# Patient Record
Sex: Female | Born: 1950 | Race: White | Hispanic: No | Marital: Married | Smoking: Never smoker
Health system: Southern US, Community
[De-identification: ages and names within clinical notes are randomized; demographics above are authoritative.]

## PROBLEM LIST (undated history)

## (undated) DIAGNOSIS — M810 Age-related osteoporosis without current pathological fracture: Secondary | ICD-10-CM

## (undated) DIAGNOSIS — L570 Actinic keratosis: Secondary | ICD-10-CM

## (undated) DIAGNOSIS — Z923 Personal history of irradiation: Secondary | ICD-10-CM

## (undated) DIAGNOSIS — E039 Hypothyroidism, unspecified: Secondary | ICD-10-CM

## (undated) DIAGNOSIS — C4491 Basal cell carcinoma of skin, unspecified: Secondary | ICD-10-CM

## (undated) DIAGNOSIS — E785 Hyperlipidemia, unspecified: Secondary | ICD-10-CM

## (undated) DIAGNOSIS — C50912 Malignant neoplasm of unspecified site of left female breast: Secondary | ICD-10-CM

## (undated) DIAGNOSIS — Z9221 Personal history of antineoplastic chemotherapy: Secondary | ICD-10-CM

## (undated) DIAGNOSIS — C50919 Malignant neoplasm of unspecified site of unspecified female breast: Secondary | ICD-10-CM

## (undated) DIAGNOSIS — I350 Nonrheumatic aortic (valve) stenosis: Secondary | ICD-10-CM

## (undated) HISTORY — DX: Malignant neoplasm of unspecified site of left female breast: C50.912

## (undated) HISTORY — DX: Actinic keratosis: L57.0

## (undated) HISTORY — PX: RECONSTRUCTIVE REPAIR STERNAL: SUR1094

## (undated) HISTORY — DX: Malignant neoplasm of unspecified site of unspecified female breast: C50.919

## (undated) HISTORY — DX: Basal cell carcinoma of skin, unspecified: C44.91

## (undated) HISTORY — PX: EYE SURGERY: SHX253

---

## 1998-12-25 ENCOUNTER — Other Ambulatory Visit: Admission: RE | Admit: 1998-12-25 | Discharge: 1998-12-25 | Payer: Self-pay | Admitting: *Deleted

## 2000-01-06 ENCOUNTER — Other Ambulatory Visit: Admission: RE | Admit: 2000-01-06 | Discharge: 2000-01-06 | Payer: Self-pay | Admitting: *Deleted

## 2001-03-01 ENCOUNTER — Other Ambulatory Visit: Admission: RE | Admit: 2001-03-01 | Discharge: 2001-03-01 | Payer: Self-pay | Admitting: *Deleted

## 2002-04-19 ENCOUNTER — Other Ambulatory Visit: Admission: RE | Admit: 2002-04-19 | Discharge: 2002-04-19 | Payer: Self-pay | Admitting: *Deleted

## 2003-04-22 ENCOUNTER — Other Ambulatory Visit: Admission: RE | Admit: 2003-04-22 | Discharge: 2003-04-22 | Payer: Self-pay | Admitting: *Deleted

## 2004-11-15 DIAGNOSIS — Z923 Personal history of irradiation: Secondary | ICD-10-CM

## 2004-11-15 DIAGNOSIS — C50919 Malignant neoplasm of unspecified site of unspecified female breast: Secondary | ICD-10-CM

## 2004-11-15 HISTORY — DX: Personal history of irradiation: Z92.3

## 2004-11-15 HISTORY — DX: Malignant neoplasm of unspecified site of unspecified female breast: C50.919

## 2004-11-15 HISTORY — PX: BREAST LUMPECTOMY: SHX2

## 2004-12-23 ENCOUNTER — Encounter: Admission: RE | Admit: 2004-12-23 | Discharge: 2004-12-23 | Payer: Self-pay | Admitting: General Surgery

## 2005-01-07 ENCOUNTER — Encounter: Admission: RE | Admit: 2005-01-07 | Discharge: 2005-01-07 | Payer: Self-pay | Admitting: Surgery

## 2005-01-07 ENCOUNTER — Encounter (INDEPENDENT_AMBULATORY_CARE_PROVIDER_SITE_OTHER): Payer: Self-pay | Admitting: *Deleted

## 2005-01-07 ENCOUNTER — Ambulatory Visit (HOSPITAL_COMMUNITY): Admission: RE | Admit: 2005-01-07 | Discharge: 2005-01-07 | Payer: Self-pay | Admitting: Surgery

## 2005-01-07 ENCOUNTER — Ambulatory Visit (HOSPITAL_BASED_OUTPATIENT_CLINIC_OR_DEPARTMENT_OTHER): Admission: RE | Admit: 2005-01-07 | Discharge: 2005-01-07 | Payer: Self-pay | Admitting: Surgery

## 2005-01-12 ENCOUNTER — Ambulatory Visit: Payer: Self-pay | Admitting: Oncology

## 2005-01-18 ENCOUNTER — Ambulatory Visit: Payer: Self-pay | Admitting: Internal Medicine

## 2005-02-13 ENCOUNTER — Ambulatory Visit: Payer: Self-pay | Admitting: Internal Medicine

## 2005-03-15 ENCOUNTER — Ambulatory Visit: Payer: Self-pay | Admitting: Internal Medicine

## 2005-04-15 ENCOUNTER — Ambulatory Visit: Payer: Self-pay | Admitting: Internal Medicine

## 2005-09-09 ENCOUNTER — Ambulatory Visit: Payer: Self-pay | Admitting: Radiation Oncology

## 2006-03-28 ENCOUNTER — Ambulatory Visit: Payer: Self-pay | Admitting: Internal Medicine

## 2006-07-12 ENCOUNTER — Ambulatory Visit: Payer: Self-pay | Admitting: Gastroenterology

## 2007-03-29 ENCOUNTER — Ambulatory Visit: Payer: Self-pay | Admitting: Radiation Oncology

## 2007-04-16 ENCOUNTER — Ambulatory Visit: Payer: Self-pay | Admitting: Radiation Oncology

## 2007-11-16 ENCOUNTER — Ambulatory Visit: Payer: Self-pay | Admitting: Radiation Oncology

## 2007-11-16 DIAGNOSIS — Z9221 Personal history of antineoplastic chemotherapy: Secondary | ICD-10-CM

## 2007-11-16 DIAGNOSIS — C50912 Malignant neoplasm of unspecified site of left female breast: Secondary | ICD-10-CM

## 2007-11-16 HISTORY — DX: Malignant neoplasm of unspecified site of left female breast: C50.912

## 2007-11-16 HISTORY — DX: Personal history of antineoplastic chemotherapy: Z92.21

## 2007-12-17 ENCOUNTER — Ambulatory Visit: Payer: Self-pay | Admitting: Radiation Oncology

## 2007-12-22 ENCOUNTER — Encounter: Admission: RE | Admit: 2007-12-22 | Discharge: 2007-12-22 | Payer: Self-pay | Admitting: Surgery

## 2008-01-14 ENCOUNTER — Ambulatory Visit: Payer: Self-pay | Admitting: Radiation Oncology

## 2008-02-05 ENCOUNTER — Encounter (INDEPENDENT_AMBULATORY_CARE_PROVIDER_SITE_OTHER): Payer: Self-pay | Admitting: Surgery

## 2008-02-05 ENCOUNTER — Inpatient Hospital Stay (HOSPITAL_COMMUNITY): Admission: RE | Admit: 2008-02-05 | Discharge: 2008-02-06 | Payer: Self-pay | Admitting: Surgery

## 2008-03-15 ENCOUNTER — Ambulatory Visit: Payer: Self-pay | Admitting: Oncology

## 2008-03-25 ENCOUNTER — Encounter (INDEPENDENT_AMBULATORY_CARE_PROVIDER_SITE_OTHER): Payer: Self-pay | Admitting: Specialist

## 2008-03-25 ENCOUNTER — Ambulatory Visit (HOSPITAL_BASED_OUTPATIENT_CLINIC_OR_DEPARTMENT_OTHER): Admission: RE | Admit: 2008-03-25 | Discharge: 2008-03-25 | Payer: Self-pay | Admitting: Specialist

## 2008-04-15 ENCOUNTER — Ambulatory Visit: Payer: Self-pay | Admitting: Oncology

## 2008-05-15 ENCOUNTER — Ambulatory Visit: Payer: Self-pay | Admitting: Oncology

## 2008-06-15 ENCOUNTER — Ambulatory Visit: Payer: Self-pay | Admitting: Oncology

## 2008-07-16 ENCOUNTER — Ambulatory Visit: Payer: Self-pay | Admitting: Oncology

## 2008-08-15 ENCOUNTER — Ambulatory Visit: Payer: Self-pay | Admitting: Oncology

## 2008-09-11 ENCOUNTER — Ambulatory Visit (HOSPITAL_BASED_OUTPATIENT_CLINIC_OR_DEPARTMENT_OTHER): Admission: RE | Admit: 2008-09-11 | Discharge: 2008-09-11 | Payer: Self-pay | Admitting: Specialist

## 2008-09-11 ENCOUNTER — Encounter (INDEPENDENT_AMBULATORY_CARE_PROVIDER_SITE_OTHER): Payer: Self-pay | Admitting: Specialist

## 2008-09-15 ENCOUNTER — Ambulatory Visit: Payer: Self-pay | Admitting: Oncology

## 2008-10-15 ENCOUNTER — Ambulatory Visit: Payer: Self-pay | Admitting: Oncology

## 2008-10-21 ENCOUNTER — Encounter (INDEPENDENT_AMBULATORY_CARE_PROVIDER_SITE_OTHER): Payer: Self-pay | Admitting: Specialist

## 2008-10-21 ENCOUNTER — Ambulatory Visit (HOSPITAL_BASED_OUTPATIENT_CLINIC_OR_DEPARTMENT_OTHER): Admission: RE | Admit: 2008-10-21 | Discharge: 2008-10-21 | Payer: Self-pay | Admitting: Specialist

## 2008-11-15 ENCOUNTER — Ambulatory Visit: Payer: Self-pay | Admitting: Oncology

## 2008-11-15 HISTORY — PX: AUGMENTATION MAMMAPLASTY: SUR837

## 2008-11-15 HISTORY — PX: REDUCTION MAMMAPLASTY: SUR839

## 2008-11-15 HISTORY — PX: MASTECTOMY: SHX3

## 2008-12-16 ENCOUNTER — Ambulatory Visit: Payer: Self-pay | Admitting: Oncology

## 2009-01-13 ENCOUNTER — Ambulatory Visit: Payer: Self-pay | Admitting: Oncology

## 2009-01-27 ENCOUNTER — Encounter (INDEPENDENT_AMBULATORY_CARE_PROVIDER_SITE_OTHER): Payer: Self-pay | Admitting: Specialist

## 2009-01-27 ENCOUNTER — Ambulatory Visit (HOSPITAL_BASED_OUTPATIENT_CLINIC_OR_DEPARTMENT_OTHER): Admission: RE | Admit: 2009-01-27 | Discharge: 2009-01-27 | Payer: Self-pay | Admitting: Specialist

## 2009-02-13 ENCOUNTER — Ambulatory Visit: Payer: Self-pay | Admitting: Oncology

## 2009-03-15 ENCOUNTER — Ambulatory Visit: Payer: Self-pay | Admitting: Oncology

## 2009-05-15 ENCOUNTER — Ambulatory Visit: Payer: Self-pay | Admitting: Oncology

## 2009-06-04 ENCOUNTER — Ambulatory Visit: Payer: Self-pay | Admitting: Oncology

## 2009-06-15 ENCOUNTER — Ambulatory Visit: Payer: Self-pay | Admitting: Oncology

## 2009-09-15 ENCOUNTER — Ambulatory Visit: Payer: Self-pay | Admitting: Oncology

## 2009-10-02 ENCOUNTER — Ambulatory Visit: Payer: Self-pay | Admitting: Oncology

## 2009-10-15 ENCOUNTER — Ambulatory Visit: Payer: Self-pay | Admitting: Oncology

## 2010-01-13 ENCOUNTER — Ambulatory Visit: Payer: Self-pay | Admitting: Oncology

## 2010-01-30 ENCOUNTER — Ambulatory Visit: Payer: Self-pay | Admitting: Oncology

## 2010-02-13 ENCOUNTER — Ambulatory Visit: Payer: Self-pay | Admitting: Oncology

## 2010-08-13 ENCOUNTER — Ambulatory Visit: Payer: Self-pay | Admitting: Oncology

## 2010-08-15 ENCOUNTER — Ambulatory Visit: Payer: Self-pay | Admitting: Oncology

## 2010-09-15 ENCOUNTER — Ambulatory Visit: Payer: Self-pay | Admitting: Oncology

## 2010-12-06 ENCOUNTER — Encounter: Payer: Self-pay | Admitting: Surgery

## 2011-02-15 ENCOUNTER — Ambulatory Visit: Payer: Self-pay | Admitting: Oncology

## 2011-02-25 LAB — POCT HEMOGLOBIN-HEMACUE: Hemoglobin: 13.7 g/dL (ref 12.0–15.0)

## 2011-03-16 ENCOUNTER — Ambulatory Visit: Payer: Self-pay | Admitting: Oncology

## 2011-03-30 NOTE — Op Note (Signed)
NAMEZETHA, KUHAR NO.:  [id number]   MEDICAL RECORD NO.:  [id number]          PATIENT TYPE:  INP   LOCATION:  2899                         FACILITY:  [facility]   PHYSICIAN:  Yaakov Guthrie. Shon Hough, M.D.DATE OF BIRTH:  January 08, 1951   DATE OF PROCEDURE:  02/05/2008  DATE OF DISCHARGE:                               OPERATIVE REPORT   PREOPERATIVE DIAGNOSIS:  Biopsy proven left breast cancer.   POSTOPERATIVE DIAGNOSIS:  Biopsy proven left breast cancer.   OPERATION PERFORMED:  Reconstruction with tissue expansion after patient  underwent mastectomy by Currie Paris, M.D.   SURGEON:  Yaakov Guthrie. Shon Hough, M.D.   ANESTHESIA:   DESCRIPTION OF PROCEDURE:  The patient after having had the mastectomy,  reprep was done to the area with Betadine soap and solution and walled  off with sterile towels and drapes so as to make a sterile field.  A  space was made, a pocket at the junction of the pectoralis major and the  serratus anterior with the Bovie unit on coagulation.  Flap was advanced  laterally as well as pectorally superiorly and medially.  Using the  lighted retractor, I was able to dissect over to the cleavage area, then  inferiorly over the fifth rib, down to that area.  After proper  hemostasis, tissue expansion, Men tor Spectrum type was placed within  the space and was filled up to 400 mL of saline.  The port was placed  laterally into the axillary area and secured with 3-0 Monocryl suture.  After proper hemostasis the flaps were then reclosed.  There was some  thinness of the flap at the inferior portion there that I pointed out to  Dr. Jamey Ripa in the operating room and the wound was closed in layers with  2-0 Monocryl x2 layers, then a running subcuticular stitch with 3-0  Monocryl.  Steri-Strips and sterile dressings were applied.  The wounds  were drained with #10 fully fluted Blake drain which was placed in the  depths of the wound and brought out through  the lateral most portion of  the incision and secured with 3-0 Prolene.  All the wounds were  cleansed.  Half inch Steri-Strips and soft dressings applied to all  areas including Xeroform, 4 x 4s, ABDs and HypaFix tape.  She withstood  the procedures very well, was taken to recovery in excellent condition.      Yaakov Guthrie. Shon Hough, M.D.  Electronically Signed     GLT/MEDQ  D:  02/05/2008  T:  02/05/2008  Job:  [id number]

## 2011-03-30 NOTE — Op Note (Signed)
Kathy Wood, BARDIN NO.:  [id number]   MEDICAL RECORD NO.:  [id number]          PATIENT TYPE:  INP   LOCATION:  2899                         FACILITY:  [facility]   PHYSICIAN:  Currie Paris, M.D.DATE OF BIRTH:  05-09-1951   DATE OF PROCEDURE:  02/05/2008  DATE OF DISCHARGE:                               OPERATIVE REPORT   OFFICE MEDICAL RECORD NUMBER [id number]   PREOPERATIVE DIAGNOSIS:  Carcinoma left breast, upper inner quadrant,  clinical stage I.   POSTOPERATIVE DIAGNOSIS:  Carcinoma left breast, upper inner quadrant,  clinical stage I.   OPERATION:  Left total mastectomy with blue dye injection and axillary  sentinel lymph node biopsy.   CLINICAL HISTORY:  Ms. 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  She has now developed a new breast cancer in another quadrant,  this one in the upper inner quadrant.  This was an invasive ductal.  After discussion of alternatives, she elected proceed to total  mastectomy with sentinel node evaluation.  She saw Dr. Shon Hough for  reconstruction purposes.   DESCRIPTION OF PROCEDURE:  The patient was seen in the holding area and  she had no further questions.  We confirmed mastectomy with  reconstruction as the planned procedure with addition of sentinel lymph  node biopsy.  At the time I saw her, she already had a radioactive  isotope injected.  I initialed the left breast as the operative side,  confirming with the patient.   The patient taken to the operating room.  After satisfactory general  anesthesia had been obtained, the time-out was performed.   I then injected 5 mL dilute methylene blue subareolarly and massaged  that in.  The breast was then prepped and draped as a sterile field.  Using Neoprobe, I identified a hot area and marked the skin overlying  that.  I outlined an elliptical incision, taking a fairly long  inferior  flap as there was residual hematoma from a biopsy and I wanted to be  sure we occluded the as much of the overlying skin as possible in the  upper inner quadrant.  The incision was made.  Skin flaps were raised  first medially to the sternum, superiorly towards the clavicle and then  laterally out into the axilla.  Once I got into that area, I used the  NeoProbe and found a single hot node, non-blue and removed it.  Had  counts of about 900.  With that out, there were no other counts greater  than 10.  I saw no other evidence of a palpable abnormal nodes nor any  blue dye entering the axilla.   The inferior flap was then made going to the inframammary fold and then  laterally to the latissimus.  The breast was removed from medial to  lateral.  Bleeders were either coagulated or tied with a Vicryl suture  as indicated.  The final lateral attachment to latissimus and the chest  wall were divided with a cautery.   I irrigated and made  sure everything was dry.  I again checked the  axilla to make sure I had not left behind any abnormal lymph nodes and  it appeared okay.   At this point, warm moist packs were placed for Dr. Shon Hough to come in  to complete the reconstruction.      Currie Paris, M.D.  Electronically Signed     CJS/MEDQ  D:  02/05/2008  T:  02/05/2008  Job:  [id number]

## 2011-03-30 NOTE — Op Note (Signed)
Kathy Wood, Kathy Wood              ACCOUNT NO.:  [id number]   MEDICAL RECORD NO.:  [id number]          PATIENT TYPE:  AMB   LOCATION:  DSC                          FACILITY:  [facility]   PHYSICIAN:  Earvin Hansen L. [place]es[place], M.D.DATE OF BIRTH:  [birth year]   DATE OF PROCEDURE:  10/21/2008  DATE OF DISCHARGE:                               OPERATIVE REPORT   The patient with a history of left breast cancer, had previous  mastectomy and tissue expansion.  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   PROCEDURES PLANNED:  Right breast reduction using the inferior pedicle  technique.   ANESTHESIA:  General.   Preoperatively, the patient was sat up and drawn for the right inferior  pedicle reduction mammoplasty, re-marked the nipple-areola complex's  distance from the suprasternal notch to 20 cm.  She underwent general  anesthesia and intubated orally.  Prep was done to the chest and breast  areas in a routine fashion using Hibiclens soap and solution, walled off  with sterile towels and drapes so as to make a sterile field.  The areas  were anesthetized with 0.25% Xylocaine with epinephrine, 1:400,000  concentration, total of 150 mL.  The wounds were scored with #15 blade,  and the skin over the inferior pedicle was de-epithelialized with #20  blade.  Medial and lateral fatty dermal pedicles were incised down to  underlying fascia.  Laterally, more accessory breast tissue was removed.  Hemostasis was maintained with the Bovie coagulation.  The new keyhole  area was also debulked, and after the areas received proper hemostasis,  the flaps were rotated and stayed with 3-0 Prolene.  Subcutaneous  closure was done with 3-0 Monocryl x2 layers and running subcuticular  stitch of 3-0 Monocryl and 5-0 Monocryl throughout the inverted T.  The  wound was  drained with a #10 fully-fluted Blake drain, which was placed  in the depths of the wound and brought out of the lateral-most portion  of the incision and secured with 3-0 Prolene.  The wounds were cleansed.  Half inch Steri-Strips and soft dressings were applied including  Xeroform, 4 x 4s, ABDs, Hypafix tape.  She withstood the procedures very  well, was taken to recovery in excellent condition.      Yaakov Guthrie. Shon Hough, M.D.  Electronically Signed     GLT/MEDQ  D:  10/21/2008  T:  10/21/2008  Job:  [id number]

## 2011-03-30 NOTE — Op Note (Signed)
NAMEMIONNA, ADVINCULA              ACCOUNT NO.:  [id number]   MEDICAL RECORD NO.:  [id number]          PATIENT TYPE:  AMB   LOCATION:  DSC                          FACILITY:  [facility]   PHYSICIAN:  Earvin Hansen L. Shon Hough, M.D.DATE OF BIRTH:  15-Mar-1951   DATE OF PROCEDURE:  03/25/2008  DATE OF DISCHARGE:                               OPERATIVE REPORT   INDICATIONS:  A 60 year old lady with recurrent left breast cancer.  The  patient underwent a left mastectomy with tissue expansion several weeks  ago.  Mastectomy was done by Dr. 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  I have  treated this with intensive dressing changes, medications, enzymatic  debriders, etc,  but still we are left with three eschar areas that are  just resistant to healing, and I am afraid that if we continued to wait  after all these weeks, this is going to again be infected, and we are  going to have to remove the tissue expander.  The patient also has been  notified by our oncologist whom I spoke with last week that she will  definitely need chemotherapy and they need to get going within the next  8-12 weeks.  The best decision is to excise all the eschar x3 and then  reconstruction of the defects.   ANESTHESIA:  General.   The patient underwent general anesthesia, intubated orally, prep was  done to the left chest, right chest areas in the routine fashion using  Hibiclens soap and solution, walled off with sterile towels and drapes  to make a sterile field.  Marcaine was used to outline the areas of the  eschar, two in the horizontal crease line of the incision one, however,  vertically in the mid portion of the breast.  They were outlined  elliptically and excised down to underlying deep and subcutaneous tissue  and muscle.  Hemostasis maintained with the Bovie anticoagulation.  Next, the flaps were freed significantly superiorly and inferiorly to  allow [place]  to close the defects without tension and rotate the flap ends  and inset with deep 2-0 sutures of Monocryl, subdermal suture of 3-0  Monocryl, then around the subcuticular stitch of 3-0 Monocryl, all the  areas x3.  Steri-Strips and soft dressings were applied to all the  areas.  She tolerated the procedure very well.   ESTIMATED BLOOD LOSS:  Less than 100 mL.   COMPLICATIONS:  None.      Yaakov Guthrie. Shon Hough, M.D.  Electronically Signed     GLT/MEDQ  D:  03/25/2008  T:  03/26/2008  Job:  [id number]

## 2011-03-30 NOTE — Op Note (Signed)
NAMEMIRRA, Kathy Wood              ACCOUNT NO.:  [id number]   MEDICAL RECORD NO.:  [id number]          PATIENT TYPE:  AMB   LOCATION:  DSC                          FACILITY:  [facility]   PHYSICIAN:  Earvin Hansen L. Shon Hough, M.D.DATE OF BIRTH:  13-Apr-1951   DATE OF PROCEDURE:  DATE OF DISCHARGE:                               OPERATIVE REPORT   A 60 year old lady with history of left breast cancer.  She had previous  mastectomy and tissue expansion done.  Tissue expansion was delayed  because of necrosis of possible skin flap from the mastectomy.  The  areas had subsequently reexcised and that she is ready for tissue  expansion, but now has a port that is twisted.   PROCEDURE:  Removal of tissue expansion.   FINDINGS:  Scar tissue in the left side released, tissue expansion  replacement using a Mentor, reference number [phone], lot number  [id number], and we filled it up today to 400 mL.   Preoperatively, the patient was set up and drawn for the midline of the  chest as well as the inframammary fold in the left side.  She underwent  general anesthesia, intubated orally.  Prep was done to the chest,  breast areas bilaterally using Hibiclens soap and solution, walled off  with sterile towels and drapes so as to make a sterile field.  A 0.5%  Xylocaine with epinephrine was injected locally laterally.  That part of  the incision was open, carried down to the skin and subcutaneous tissue  and underlying pectoralis major muscle.  The muscle was divided in the  direction of its fibers removing the tissue expander.  Next, we traced  up the port, which had been dislodged from the lateral portion of the  axillary region to medially and this was removed and had also flipped on  itself, showing the back end.  After this, the new tissue expander was  placed.  The new port site placed laterally and inferiorly away from  everything, so that easy to inflate her.  We then went ahead and put 400  mL of saline  at this time to get a good head start.  The wounds were  then cleansed.  The muscle repaired with 2-0 Monocryl subcutaneous  tissue and the skin was reapproximated with running subcuticular stitch  of 3-0 Monocryl.  Scar tissue have been removed laterally over the  pocket next to the axillary region with the Bovie unit on coagulation.  The wounds were cleansed.  Steri-Strips and sterile dressing were  applied in all the areas.  She withstood the procedures very well and  was taken to recovery in excellent condition.  Postoperative instruction  to be given.   ESTIMATED BLOOD LOSS:  Less than 100 mL.   COMPLICATIONS:  None.      Yaakov Guthrie. Shon Hough, M.D.  Electronically Signed     GLT/MEDQ  D:  09/11/2008  T:  09/12/2008  Job:  [id number]

## 2011-03-30 NOTE — Op Note (Signed)
NAMEBRI, Kathy Wood              ACCOUNT NO.:  [id number]   MEDICAL RECORD NO.:  [id number]          PATIENT TYPE:  AMB   LOCATION:  DSC                          FACILITY:  [facility]   PHYSICIAN:  Earvin Hansen L. Shon Hough, M.D.DATE OF BIRTH:  May 27, 1951   DATE OF PROCEDURE:  01/27/2009  DATE OF DISCHARGE:                               OPERATIVE REPORT   A 57-year lady who has a history of breast cancer.  I got involved with  her by doing immediate tissue expander reconstruction at the time of her  mastectomy over 2 years ago.  This was complicated by the mastectomy  scar being made a little too thin by the general surgeon causing  ischemic changes of the midportion of the skin and disallowing me to  expand the tissue expander in the timely manner.  She went through a  period where the skin looked better and we waited until it demarcated  itself, then it plateaued over the several weeks' time with the  plateauing.  We advised her to come and have me excised, so that I would  reconstruct the area which has been done and has done well.  Over the  last 2 weeks, although she has developed a pocket with a cystic area  involving the left tissue expander in the chest wall area.  She has  increased scar tissue as preventing the tissue expander to fall in place  in the inframammary fold area.  It seems to be locked up at the outer  quadrant and upper quadrant.   PROCEDURES PLANNED:  Exploration of the left breast tissue expander  area, removal of tissue expander, freeing up of scar tissue, a  capsulotomy, and also capsulectomies, reposition the pocket to allow of  the new tissue expander to fall in place with good symmetry as compared  to the right side, excision of cyst involving the left chest wall and  closure.   ANESTHESIA:  General.   Preoperatively, the patient was stood up and drawn for the midline of  the chest as well as demarcation of the inframammary fold with a right  side being my  template, and I drew a line straight across to measure  with that as well as cleavage areas.  After undergoing general  anesthesia and intubated orally, prep was done to the chest wall and  abdominal areas with Hibiclens soap and solution and walled off with  sterile towels and drapes so as make a sterile field.  A 0.5% Xylocaine  with epinephrine was injected locally for vasoconstriction.  We entered  the lateral portion of the horizontal scar without the skin and  subcutaneous tissue underlying pectoralis major muscle and dividing the  rectum of its fibers to enter into the pocket with scar tissue.  The  implant was removed.  Tremendous scar tissue was involved.  Using the  lighted retractor,  I was able to free up scar tissue and excise a  thickened capsule formation medially and laterally as well as in the  inframammary fold area.  She also had a way-up area across the lower  part of  the incision area which was freed up from inside with  capsulectomy and allowed that tissue to soften and then expand out  without any dimpling.  Hemostasis was maintained with the Bovie  anticoagulation.  The wounds were irrigated with copious amounts of  saline.  After this, the pocket was then replanted with a Mentor tissue  expander, the number was [id number] and the lot number was [id number] and  the reference number [phone].  We filled up to 450 mL with excellent  symmetry for now.  The lateral portion of the compartment part was  plicated with multiple sutures of 2-0 Monocryl.  The muscles were then  reclosed with 2-0 Monocryl in subcutaneous tissue and then the skin  edges were reapproximated with a running subcuticular stitch of 3-0  Monocryl.  The port was placed inferior to the previous location.  I  also observed from inside of the wound the fact that  the mass that was  present on the top part of the skin of the left chest which had a  softness to it and did not penetrate through-and-through, so  it was not  a true herniation.  With this, I was able to excise that tissue and  seemed to be filled with some seromatous fluid.  It was removed  completely and then the wound was closed with 3-0 nylon.  Steri-Strips  and soft dressing were applied to all the areas.  The wound was also  drained with #10 fully fluted Blake drain which was  placed in the depths of wound and brought to the lateral-most portion of  the incision and secured with 3-0 Prolene.  The wounds were cleansed.  The 0.5-inch Steri-Strips and soft dressings were applied to all the  areas.  She tolerated the procedures very well and was taken to the  recovery in excellent condition.      Yaakov Guthrie. Shon Hough, M.D.  Electronically Signed     GLT/MEDQ  D:  01/27/2009  T:  01/28/2009  Job:  [id number]

## 2011-04-02 NOTE — Op Note (Signed)
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   POSTOPERATIVE DIAGNOSIS:  Calcifications left breast with ductal carcinoma  in situ.   OPERATION:  Needle guided partial mastectomy, left side.   SURGEON:  Currie Paris, M.D.   ASSISTANT:  Barth Kirks Mews, PA student.   ANESTHESIA:  General.   CLINICAL HISTORY:  Ms. Hopman is a 60 year old lady who recently had a  little cluster of calcifications noted laterally in the left breast which  were biopsied and shown to have DCIS.  An MRI suggested a larger area of  involvement, but on further evaluation this was felt most likely to  represent hematoma.  However, we decided to bracket the area for excision  and take out a wide area to be sure that no suspicious areas were left  behind.   DESCRIPTION OF PROCEDURE:  The patient was seen in the holding area and had  no further questions.  The patient and I both marked the left breast as the  operative site.  She was taken to the operating room and after satisfactory  general anesthesia had been obtained the left breast was prepped and draped.  A time out occurred to confirm all information was correct.   The 4 guidewires bracketed the area and entered laterally and tracked a  little bit superiorly.  I made an incision between the 2 superior wires and  the 2 inferior wires going in a directly lateral position.  Since I new the  wires retracted superiorly I divided the breast tissue inferior to the 2  inferior wires down to the chest wall then worked around  medially since the  wires did not appear to track medially and then raised a flap superiorly to  go well above I thought the wire tips would be and then take that down to  the chest wall; and then I was able to grasp all of the tissue in my hand,  elevate it out of the wound and divide the lateral attachments with the  cautery.   A few minutes were spent achieving hemostasis.  The specimen was marked with  a margin marker for orientation, and sent for specimen mammography.   The remaining tissue around the breast was primarily fatty and nothing  appeared to be suspicious. Once everything was dried, I closed the subcu  using 3-0 Vicryl and the skin with some 4-0 Monocryl, subcuticular and  Dermabond.   The patient tolerated the procedure well.  There were no complications.  All  counts were correct.  Dr. Isaiah Serge indicated that all the calcifications were  moved and well in the center of the specimen.  CJS/MEDQ  D:  01/07/2005  T:  01/07/2005  Job:  [id number]   cc:   Pershing Cox, M.D.  [address]  [place]  [place] [address]  Fax: [phone]   Alan Mulder, M.D.  [address]  [place]  [place] [address]  Fax: [phone]   Jeralyn Ruths, M.D.

## 2011-04-02 NOTE — Discharge Summary (Signed)
NAMERHEANNA, Kathy Wood NO.:  [id number]   MEDICAL RECORD NO.:  [id number]          PATIENT TYPE:  INP   LOCATION:  5118                         FACILITY:  [facility]   PHYSICIAN:  Currie Paris, M.D.DATE OF BIRTH:  04/05/1951   DATE OF ADMISSION:  02/05/2008  DATE OF DISCHARGE:  02/06/2008                               DISCHARGE SUMMARY   FINAL DIAGNOSIS:  Carcinoma, left breast T1b N0, ER negative, PR  negative, HER-2/Neu positive.   CLINICAL HISTORY:  [name] is a 56-year lady who has previously had a  lumpectomy and radiation therapy for breast cancer.  She has developed a  new lesion in the same breast.  After evaluation, she elected to have a  total mastectomy with sentinel lymph node evaluation and a  reconstruction with tissue expander.   HOSPITAL COURSE:  The patient was admitted on February 05, 2008, and taken  to the operating room where the surgery as noted was performed and she  tolerated that well.  The day following surgery, she was ambulating,  felt fine, her JP drainage was thin, her dressing was dry, and her pain  was well controlled.  She was able to be discharged.   Final pathology report showed negative lymph node and a 0.8-cm grade 2  invasive ductal carcinoma.   PLAN:  Plan is for follow up in my office and by Dr. Shon Hough.      Currie Paris, M.D.  Electronically Signed     CJS/MEDQ  D:  02/28/2008  T:  02/28/2008  Job:  [id number]

## 2011-08-09 LAB — URINALYSIS, ROUTINE W REFLEX MICROSCOPIC
Bilirubin Urine: NEGATIVE
Ketones, ur: NEGATIVE
Nitrite: NEGATIVE
Protein, ur: NEGATIVE
pH: 5.5

## 2011-08-09 LAB — CBC
HCT: 41.8
MCV: 94.4
RBC: 4.42
WBC: 5.6

## 2011-08-09 LAB — DIFFERENTIAL
Basophils Absolute: 0
Basophils Relative: 1
Eosinophils Relative: 2
Monocytes Relative: 5

## 2011-08-09 LAB — COMPREHENSIVE METABOLIC PANEL
AST: 26
Alkaline Phosphatase: 69
BUN: 9
CO2: 33 — ABNORMAL HIGH
Calcium: 10
Creatinine, Ser: 0.66
GFR calc non Af Amer: >60
Potassium: 3.5
Sodium: 141
Total Protein: 7.5

## 2011-08-09 LAB — URINE MICROSCOPIC-ADD ON

## 2011-08-20 LAB — POCT HEMOGLOBIN-HEMACUE: Hemoglobin: 12.6 g/dL (ref 12.0–15.0)

## 2011-08-23 ENCOUNTER — Ambulatory Visit: Payer: Self-pay | Admitting: Oncology

## 2011-09-16 ENCOUNTER — Ambulatory Visit: Payer: Self-pay | Admitting: Oncology

## 2012-02-21 ENCOUNTER — Ambulatory Visit: Payer: Self-pay | Admitting: Oncology

## 2012-02-21 LAB — COMPREHENSIVE METABOLIC PANEL
BUN: 11 mg/dL (ref 7–18)
Bilirubin,Total: 0.6 mg/dL (ref 0.2–1.0)
Calcium, Total: 9.2 mg/dL (ref 8.5–10.1)
Chloride: 106 mmol/L (ref 98–107)
Co2: 28 mmol/L (ref 21–32)
EGFR (Non-African Amer.): >60
Osmolality: 280 (ref 275–301)
Potassium: 4.2 mmol/L (ref 3.5–5.1)
SGOT(AST): 28 U/L (ref 15–37)
Sodium: 141 mmol/L (ref 136–145)
Total Protein: 7.8 g/dL (ref 6.4–8.2)

## 2012-02-21 LAB — CBC CANCER CENTER
Basophil #: 0 x10 3/mm (ref 0.0–0.1)
Basophil %: 0.6 %
MCH: 32.6 pg (ref 26.0–34.0)
MCV: 94 fL (ref 80–100)
Neutrophil #: 2.1 x10 3/mm (ref 1.4–6.5)
Neutrophil %: 53.2 %
Platelet: 239 x10 3/mm (ref 150–440)
RBC: 4.39 10*6/uL (ref 3.80–5.20)

## 2012-03-08 ENCOUNTER — Encounter (INDEPENDENT_AMBULATORY_CARE_PROVIDER_SITE_OTHER): Payer: Self-pay

## 2012-03-15 ENCOUNTER — Ambulatory Visit: Payer: Self-pay | Admitting: Oncology

## 2012-09-05 ENCOUNTER — Ambulatory Visit: Payer: Self-pay | Admitting: Oncology

## 2012-09-05 LAB — COMPREHENSIVE METABOLIC PANEL
BUN: 17 mg/dL (ref 7–18)
Chloride: 104 mmol/L (ref 98–107)
Co2: 29 mmol/L (ref 21–32)
EGFR (Non-African Amer.): >60
Potassium: 3.8 mmol/L (ref 3.5–5.1)
SGOT(AST): 21 U/L (ref 15–37)
SGPT (ALT): 26 U/L (ref 12–78)
Total Protein: 7.3 g/dL (ref 6.4–8.2)

## 2012-09-05 LAB — CBC CANCER CENTER
Basophil %: 1.1 %
Eosinophil #: 0.1 x10 3/mm (ref 0.0–0.7)
Eosinophil %: 1.9 %
HCT: 41.1 % (ref 35.0–47.0)
HGB: 13.4 g/dL (ref 12.0–16.0)
Lymphocyte #: 1.9 x10 3/mm (ref 1.0–3.6)
MCH: 31.3 pg (ref 26.0–34.0)
MCHC: 32.5 g/dL (ref 32.0–36.0)
MCV: 96 fL (ref 80–100)
Monocyte #: 0.3 x10 3/mm (ref 0.2–0.9)
Neutrophil #: 2.7 x10 3/mm (ref 1.4–6.5)
RBC: 4.26 10*6/uL (ref 3.80–5.20)

## 2012-09-15 ENCOUNTER — Ambulatory Visit: Payer: Self-pay | Admitting: Oncology

## 2013-02-13 ENCOUNTER — Ambulatory Visit: Payer: Self-pay | Admitting: Oncology

## 2013-02-28 ENCOUNTER — Encounter (INDEPENDENT_AMBULATORY_CARE_PROVIDER_SITE_OTHER): Payer: Self-pay

## 2013-03-12 LAB — COMPREHENSIVE METABOLIC PANEL
Albumin: 4 g/dL (ref 3.4–5.0)
Anion Gap: 7 (ref 7–16)
BUN: 15 mg/dL (ref 7–18)
Calcium, Total: 9.9 mg/dL (ref 8.5–10.1)
Chloride: 103 mmol/L (ref 98–107)
Co2: 30 mmol/L (ref 21–32)
Creatinine: 0.9 mg/dL (ref 0.60–1.30)
EGFR (African American): >60
Osmolality: 280 (ref 275–301)
Potassium: 5.1 mmol/L (ref 3.5–5.1)
SGOT(AST): 19 U/L (ref 15–37)
SGPT (ALT): 25 U/L (ref 12–78)

## 2013-03-12 LAB — CBC CANCER CENTER
Basophil #: 0.1 x10 3/mm (ref 0.0–0.1)
Eosinophil #: 0.1 x10 3/mm (ref 0.0–0.7)
Lymphocyte #: 1.7 x10 3/mm (ref 1.0–3.6)
Lymphocyte %: 34.3 %
MCH: 31.7 pg (ref 26.0–34.0)
MCV: 93 fL (ref 80–100)
Monocyte #: 0.3 x10 3/mm (ref 0.2–0.9)
Monocyte %: 6.1 %
Neutrophil #: 2.9 x10 3/mm (ref 1.4–6.5)
Neutrophil %: 56.7 %
Platelet: 243 x10 3/mm (ref 150–440)
WBC: 5.1 x10 3/mm (ref 3.6–11.0)

## 2013-03-13 LAB — CANCER ANTIGEN 27.29: CA 27.29: 22.8 U/mL (ref 0.0–38.6)

## 2013-03-15 ENCOUNTER — Ambulatory Visit: Payer: Self-pay | Admitting: Oncology

## 2014-03-27 ENCOUNTER — Ambulatory Visit: Payer: Self-pay | Admitting: Oncology

## 2014-03-27 LAB — CBC CANCER CENTER
Basophil #: 0.1 x10 3/mm (ref 0.0–0.1)
Basophil %: 1.4 %
Eosinophil #: 0.1 x10 3/mm (ref 0.0–0.7)
Eosinophil %: 2.3 %
HCT: 40.3 % (ref 35.0–47.0)
HGB: 13.7 g/dL (ref 12.0–16.0)
Lymphocyte #: 1.5 x10 3/mm (ref 1.0–3.6)
Lymphocyte %: 31.5 %
MCH: 31.8 pg (ref 26.0–34.0)
MCHC: 34 g/dL (ref 32.0–36.0)
MCV: 94 fL (ref 80–100)
Monocyte #: 0.3 x10 3/mm (ref 0.2–0.9)
Monocyte %: 5.2 %
Neutrophil #: 2.9 x10 3/mm (ref 1.4–6.5)
Neutrophil %: 59.6 %
Platelet: 235 x10 3/mm (ref 150–440)
RBC: 4.3 10*6/uL (ref 3.80–5.20)
RDW: 12.6 % (ref 11.5–14.5)
WBC: 4.8 x10 3/mm (ref 3.6–11.0)

## 2014-03-27 LAB — COMPREHENSIVE METABOLIC PANEL
ALBUMIN: 3.8 g/dL (ref 3.4–5.0)
ALK PHOS: 73 U/L
ALT: 24 U/L (ref 12–78)
ANION GAP: 7 (ref 7–16)
AST: 19 U/L (ref 15–37)
BUN: 15 mg/dL (ref 7–18)
Bilirubin,Total: 0.4 mg/dL (ref 0.2–1.0)
CALCIUM: 9.2 mg/dL (ref 8.5–10.1)
CREATININE: 0.75 mg/dL (ref 0.60–1.30)
Chloride: 104 mmol/L (ref 98–107)
Co2: 30 mmol/L (ref 21–32)
EGFR (African American): >60
Glucose: 82 mg/dL (ref 65–99)
OSMOLALITY: 281 (ref 275–301)
POTASSIUM: 4.4 mmol/L (ref 3.5–5.1)
Sodium: 141 mmol/L (ref 136–145)
Total Protein: 7.3 g/dL (ref 6.4–8.2)

## 2014-03-28 LAB — CANCER ANTIGEN 27.29: CA 27.29: 19.5 U/mL (ref 0.0–38.6)

## 2014-03-29 ENCOUNTER — Ambulatory Visit: Payer: Self-pay | Admitting: Oncology

## 2014-04-15 ENCOUNTER — Ambulatory Visit: Payer: Self-pay | Admitting: Oncology

## 2014-11-01 ENCOUNTER — Ambulatory Visit: Payer: Self-pay | Admitting: Specialist

## 2015-03-28 ENCOUNTER — Other Ambulatory Visit: Payer: Self-pay | Admitting: *Deleted

## 2015-03-28 DIAGNOSIS — C50919 Malignant neoplasm of unspecified site of unspecified female breast: Secondary | ICD-10-CM

## 2015-03-31 ENCOUNTER — Inpatient Hospital Stay (HOSPITAL_BASED_OUTPATIENT_CLINIC_OR_DEPARTMENT_OTHER): Payer: Managed Care, Other (non HMO) | Admitting: Oncology

## 2015-03-31 ENCOUNTER — Encounter (INDEPENDENT_AMBULATORY_CARE_PROVIDER_SITE_OTHER): Payer: Self-pay

## 2015-03-31 ENCOUNTER — Inpatient Hospital Stay: Payer: Managed Care, Other (non HMO) | Attending: Oncology

## 2015-03-31 VITALS — BP 130/87 | HR 71 | Temp 95.7°F | Wt 162.5 lb

## 2015-03-31 DIAGNOSIS — Z171 Estrogen receptor negative status [ER-]: Secondary | ICD-10-CM | POA: Diagnosis not present

## 2015-03-31 DIAGNOSIS — Z923 Personal history of irradiation: Secondary | ICD-10-CM

## 2015-03-31 DIAGNOSIS — C50912 Malignant neoplasm of unspecified site of left female breast: Secondary | ICD-10-CM

## 2015-03-31 DIAGNOSIS — Z853 Personal history of malignant neoplasm of breast: Secondary | ICD-10-CM | POA: Insufficient documentation

## 2015-03-31 DIAGNOSIS — C50919 Malignant neoplasm of unspecified site of unspecified female breast: Secondary | ICD-10-CM

## 2015-03-31 LAB — COMPREHENSIVE METABOLIC PANEL
ALBUMIN: 4.4 g/dL (ref 3.5–5.0)
ALT: 17 U/L (ref 14–54)
ANION GAP: 3 — AB (ref 5–15)
AST: 21 U/L (ref 15–41)
Alkaline Phosphatase: 67 U/L (ref 38–126)
BILIRUBIN TOTAL: 0.5 mg/dL (ref 0.3–1.2)
BUN: 17 mg/dL (ref 6–20)
CHLORIDE: 103 mmol/L (ref 101–111)
CO2: 31 mmol/L (ref 22–32)
Calcium: 9.3 mg/dL (ref 8.9–10.3)
Creatinine, Ser: 0.64 mg/dL (ref 0.44–1.00)
GFR calc Af Amer: >60 mL/min (ref >60)
GFR calc non Af Amer: >60 mL/min (ref >60)
Glucose, Bld: 100 mg/dL — ABNORMAL HIGH (ref 65–99)
Potassium: 4.4 mmol/L (ref 3.5–5.1)
Sodium: 137 mmol/L (ref 135–145)
Total Protein: 7.4 g/dL (ref 6.5–8.1)

## 2015-03-31 LAB — CBC WITH DIFFERENTIAL/PLATELET
BASOS ABS: 0 10*3/uL (ref 0–0.1)
Basophils Relative: 1 %
EOS ABS: 0.1 10*3/uL (ref 0–0.7)
EOS PCT: 2 %
HCT: 42.4 % (ref 35.0–47.0)
Hemoglobin: 14.1 g/dL (ref 12.0–16.0)
Lymphocytes Relative: 37 %
Lymphs Abs: 1.6 10*3/uL (ref 1.0–3.6)
MCH: 31.2 pg (ref 26.0–34.0)
MCHC: 33.3 g/dL (ref 32.0–36.0)
MCV: 93.5 fL (ref 80.0–100.0)
MONO ABS: 0.2 10*3/uL (ref 0.2–0.9)
Monocytes Relative: 5 %
Neutro Abs: 2.4 10*3/uL (ref 1.4–6.5)
Neutrophils Relative %: 55 %
PLATELETS: 249 10*3/uL (ref 150–440)
RBC: 4.53 MIL/uL (ref 3.80–5.20)
RDW: 12.2 % (ref 11.5–14.5)
WBC: 4.4 10*3/uL (ref 3.6–11.0)

## 2015-04-01 LAB — CANCER ANTIGEN 27.29: CA 27.29: 15.1 U/mL (ref 0.0–38.6)

## 2015-04-05 ENCOUNTER — Encounter: Payer: Self-pay | Admitting: Oncology

## 2015-04-05 DIAGNOSIS — Z853 Personal history of malignant neoplasm of breast: Secondary | ICD-10-CM | POA: Insufficient documentation

## 2015-04-05 DIAGNOSIS — C50919 Malignant neoplasm of unspecified site of unspecified female breast: Secondary | ICD-10-CM | POA: Insufficient documentation

## 2015-04-05 DIAGNOSIS — C50912 Malignant neoplasm of unspecified site of left female breast: Secondary | ICD-10-CM | POA: Insufficient documentation

## 2015-04-05 DIAGNOSIS — E785 Hyperlipidemia, unspecified: Secondary | ICD-10-CM | POA: Insufficient documentation

## 2015-04-05 NOTE — Progress Notes (Signed)
[place] @ [facility] Telephone:(336) [phone]  [phone]     Kathy Wood OB: 1951/05/09  MR#: [id number]  KGM#:[id number]  Patient Care Team: Tracie Harrier, MD as PCP - General (Internal Medicine)  CHIEF COMPLAINT:  Chief Complaint  Patient presents with  . Follow-up    Oncology History   Subjective: Chief Complaint/Diagnosis:   1.  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 Completed chemotherapy with [facility] in September 2009. 2.  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  Patient had 1 year of maintenance Herceptin therapy     Cancer of left breast   04/05/2015 Initial Diagnosis Cancer of left breast    No flowsheet data found.  INTERVAL HISTORY:64 year old lady came today for yearly checkup.  Since last evaluation we did not find any abnormality.  No chills.  No fever.  No bony pains.  Patient has developed some redness in the left breast area which has been evaluated by plastic surgeon.  Ultrasound and mammogram was done.  She did not reveal any abnormality.  That redness has not changed. REVIEW OF SYSTEMS:   GENERAL:  Feels good.  Active.  No fevers, sweats or weight loss. PERFORMANCE STATUS (ECOG):  0 HEENT:  No visual changes, runny nose, sore throat, mouth sores or tenderness. Lungs: No shortness of breath or cough.  No hemoptysis. Cardiac:  No chest pain, palpitations, orthopnea, or PND. GI:  No nausea, vomiting, diarrhea, constipation, melena or hematochezia. GU:  No urgency, frequency, dysuria, or hematuria. Musculoskeletal:  No back pain.  No joint pain.  No muscle tenderness. Extremities:  No pain or swelling. Skin:  No rashes or skin changes. Neuro:  No headache, numbness or weakness, balance or coordination issues. Endocrine:  No diabetes, thyroid issues, hot flashes or night sweats. Psych:  No mood  changes, depression or anxiety. Pain:  No focal pain. Review of systems:  All other systems reviewed and found to be negative.  As per HPI. Otherwise, a complete review of systems is negatve.  PAST MEDICAL HISTORY: Past Medical History  Diagnosis Date  . Breast cancer   . Cancer of left breast 04/05/2015    PAST SURGICAL HISTORY: Past Surgical History  Procedure Laterality Date  . Mastectomy    . Reconstructive repair sternal Left     FAMILY HISTORY  Past surgical history: As above.    Family history:  No family history of colorectal cancer, breast cancer, or ovarian cancer.    Social history: Patient denies tobacco and alcohol.     ADVANCED DIRECTIVES:   does have advance medical directive  HEALTH MAINTENANCE: History  Substance Use Topics  . Smoking status: Never Smoker   . Smokeless tobacco: Not on file  . Alcohol Use: No      Allergies  Allergen Reactions  . Taxotere [Docetaxel] Other (See Comments) and Anaphylaxis    flush and back pain    Current Outpatient Prescriptions  Medication Sig Dispense Refill  . calcium carbonate (OS-CAL) 600 MG TABS tablet Take 600 mg by mouth daily.    Marland Kitchen levothyroxine (SYNTHROID, LEVOTHROID) 50 MCG tablet Take by mouth.    . Multiple Vitamin (MULTIVITAMIN) tablet Take 1 tablet by mouth daily.    . pravastatin (PRAVACHOL) 40 MG tablet Take 40 mg by mouth daily.     No current facility-administered medications for this visit.    OBJECTIVE:  Filed Vitals:   03/31/15 1106  BP: 130/87  Pulse: 71  Temp: 95.7 F (35.4 C)     Body mass index is 28.33 kg/(m^2).    ECOG FS:0 - Asymptomatic  PHYSICAL EXAM: Goal status: Performance status is good.  Patient has not lost significant weight HEENT: No evidence of stomatitis.   Sclera and conjunctivae :: No jaundice.   pale looking . Lungs: Air  entry equal on both sides.  No rhonchi.  No rales.  Cardiac: Heart sounds are normal.  No pericardial rub.  No murmur. Lymphatic  system: Cervical, axillary, inguinal, lymph nodes not palpable GI: Abdomen is soft.  No ascites.  Liver spleen not palpable.  No tenderness.  Bowel sounds are within normal limit Lower extremity: No edema Neurological system: Higher functions, cranial nerves intact No evidence of peripheral neuropathy. Skin: No rash.  No ecchymosis.. Examination of breast: Right breast free of masses.  Patient had reconstructive surgery done.  Axillary lymph nodes are not palpable.  Lab breast: Redness in a small area of reconstructive breast.  No other palpable masses.  :     STUDIES Lab Results  Component Value Date   LABCA2 15.1 03/31/2015     ASSESSMENT: 64 year old lady with a history of ER negative, PR negative, HER-2 positive stage I carcinoma breast with bilateral reconstructive surgery.  On clinical ground patient does not have any evidence of recurrent disease.  MEDICAL DECISION MAKING:  All lab data has been reviewed.  There is redness in the left breast (reconstructive site) reviewing mammogram as well as ultrasound it may be skin reaction to the capsular calcification.  Patient is being followed by plastic surgeon on a regular basis.  Patient was advised to continue her surgeon and call if redness increases. Continue follow-up in 1 year   Patient expressed understanding and was in agreement with this plan. She also understands that She can call clinic at any time with any questions, concerns, or complaints.    Cancer of left breast   Staging form: Breast, AJCC 7th Edition     Clinical: Stage IA (T1c, N0, M0) - Marni Griffon, MD   04/05/2015 10:57 AM

## 2015-12-22 ENCOUNTER — Other Ambulatory Visit: Payer: Self-pay | Admitting: Internal Medicine

## 2015-12-22 DIAGNOSIS — Z1231 Encounter for screening mammogram for malignant neoplasm of breast: Secondary | ICD-10-CM

## 2015-12-29 ENCOUNTER — Ambulatory Visit
Admission: RE | Admit: 2015-12-29 | Discharge: 2015-12-29 | Disposition: A | Discharge status: Home / Self Care | Payer: BLUE CROSS/BLUE SHIELD | Source: Ambulatory Visit | Attending: Internal Medicine | Admitting: Internal Medicine

## 2015-12-29 DIAGNOSIS — Z1231 Encounter for screening mammogram for malignant neoplasm of breast: Secondary | ICD-10-CM

## 2016-03-30 ENCOUNTER — Other Ambulatory Visit: Payer: Managed Care, Other (non HMO)

## 2016-03-30 ENCOUNTER — Ambulatory Visit: Payer: Managed Care, Other (non HMO) | Admitting: Oncology

## 2016-04-06 ENCOUNTER — Inpatient Hospital Stay: Payer: BLUE CROSS/BLUE SHIELD

## 2016-04-06 ENCOUNTER — Inpatient Hospital Stay: Payer: BLUE CROSS/BLUE SHIELD | Admitting: Oncology

## 2016-04-07 ENCOUNTER — Inpatient Hospital Stay: Payer: BLUE CROSS/BLUE SHIELD | Attending: Oncology

## 2016-04-07 ENCOUNTER — Inpatient Hospital Stay (HOSPITAL_BASED_OUTPATIENT_CLINIC_OR_DEPARTMENT_OTHER): Payer: BLUE CROSS/BLUE SHIELD | Admitting: Family Medicine

## 2016-04-07 VITALS — BP 149/82 | HR 70 | Temp 98.7°F | Resp 16 | Wt 166.6 lb

## 2016-04-07 DIAGNOSIS — Z923 Personal history of irradiation: Secondary | ICD-10-CM

## 2016-04-07 DIAGNOSIS — Z853 Personal history of malignant neoplasm of breast: Secondary | ICD-10-CM

## 2016-04-07 DIAGNOSIS — C50912 Malignant neoplasm of unspecified site of left female breast: Secondary | ICD-10-CM | POA: Diagnosis not present

## 2016-04-07 DIAGNOSIS — Z171 Estrogen receptor negative status [ER-]: Secondary | ICD-10-CM | POA: Diagnosis not present

## 2016-04-07 DIAGNOSIS — C50919 Malignant neoplasm of unspecified site of unspecified female breast: Secondary | ICD-10-CM

## 2016-04-07 DIAGNOSIS — Z9012 Acquired absence of left breast and nipple: Secondary | ICD-10-CM | POA: Insufficient documentation

## 2016-04-07 DIAGNOSIS — Z9221 Personal history of antineoplastic chemotherapy: Secondary | ICD-10-CM

## 2016-04-07 DIAGNOSIS — Z79899 Other long term (current) drug therapy: Secondary | ICD-10-CM | POA: Insufficient documentation

## 2016-04-07 LAB — COMPREHENSIVE METABOLIC PANEL
ALK PHOS: 68 U/L (ref 38–126)
ALT: 19 U/L (ref 14–54)
ANION GAP: 6 (ref 5–15)
AST: 23 U/L (ref 15–41)
Albumin: 4.5 g/dL (ref 3.5–5.0)
BILIRUBIN TOTAL: 0.6 mg/dL (ref 0.3–1.2)
BUN: 18 mg/dL (ref 6–20)
CALCIUM: 9.8 mg/dL (ref 8.9–10.3)
CO2: 30 mmol/L (ref 22–32)
Chloride: 102 mmol/L (ref 101–111)
Creatinine, Ser: 0.75 mg/dL (ref 0.44–1.00)
GFR calc Af Amer: >60 mL/min (ref >60)
GLUCOSE: 106 mg/dL — AB (ref 65–99)
Potassium: 4.2 mmol/L (ref 3.5–5.1)
Sodium: 138 mmol/L (ref 135–145)
TOTAL PROTEIN: 7.3 g/dL (ref 6.5–8.1)

## 2016-04-07 LAB — CBC WITH DIFFERENTIAL/PLATELET
Basophils Absolute: 0.1 10*3/uL (ref 0–0.1)
Basophils Relative: 1 %
Eosinophils Absolute: 0.1 10*3/uL (ref 0–0.7)
Eosinophils Relative: 2 %
HEMATOCRIT: 40.6 % (ref 35.0–47.0)
HEMOGLOBIN: 13.9 g/dL (ref 12.0–16.0)
LYMPHS ABS: 1.9 10*3/uL (ref 1.0–3.6)
Lymphocytes Relative: 31 %
MCH: 31.8 pg (ref 26.0–34.0)
MCHC: 34.1 g/dL (ref 32.0–36.0)
MCV: 93 fL (ref 80.0–100.0)
MONOS PCT: 5 %
Monocytes Absolute: 0.3 10*3/uL (ref 0.2–0.9)
NEUTROS ABS: 3.7 10*3/uL (ref 1.4–6.5)
NEUTROS PCT: 61 %
Platelets: 263 10*3/uL (ref 150–440)
RBC: 4.37 MIL/uL (ref 3.80–5.20)
RDW: 12.7 % (ref 11.5–14.5)
WBC: 6.1 10*3/uL (ref 3.6–11.0)

## 2016-04-07 NOTE — Progress Notes (Signed)
[place]  Telephone:(336) [phone]  [phone]     Kathy Wood DOB: [birth year]  MR#: [id number]  RPR#:[id number]  Patient Care Team: Tracie Harrier, MD as PCP - General (Internal Medicine)  CHIEF COMPLAINT:  Chief Complaint  Patient presents with  . Breast Cancer    follow up    INTERVAL HISTORY:  Patient is here for further follow-up regarding carcinoma of the left breast from 2009 as well as in ipsilateral DCIS from 2006. Patient overall reports feeling very well. She has not had any issues or complaints since her last visit. Patient has undergone a unilateral right screening mammogram in February 2017 that was reported as BI-RADS 1, negative. Patient was also noted to have a bone density exam in February 2017 that revealed osteoporosis. This has been discussed with her primary care provider. Patient expressed desire to no longer follow up in our clinic.  REVIEW OF SYSTEMS:   Review of Systems  Constitutional: Negative for fever, chills, weight loss, malaise/fatigue and diaphoresis.  HENT: Negative.   Eyes: Negative.   Respiratory: Negative for cough, hemoptysis, sputum production, shortness of breath and wheezing.   Cardiovascular: Negative for chest pain, palpitations, orthopnea, claudication, leg swelling and PND.  Gastrointestinal: Negative for heartburn, nausea, vomiting, abdominal pain, diarrhea, constipation, blood in stool and melena.  Genitourinary: Negative.   Musculoskeletal: Negative.   Skin: Negative.   Neurological: Negative for dizziness, tingling, focal weakness, seizures and weakness.  Endo/Heme/Allergies: Does not bruise/bleed easily.  Psychiatric/Behavioral: Negative for depression. The patient is not nervous/anxious and does not have insomnia.     As per HPI. Otherwise, a complete review of systems is negatve.  ONCOLOGY HISTORY: Oncology History   Subjective: Chief Complaint/Diagnosis:   1.  Carcinoma of breast (left)  status post mastectomy and reconstructive surgery T1, N0 M0 stage I. ER/PR negative, HER-2 positive. Completed chemotherapy with [facility] in September 2009. 2.  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  Patient had 1 year of maintenance Herceptin therapy     Cancer of left breast ([place])   04/05/2015 Initial Diagnosis Cancer of left breast    PAST MEDICAL HISTORY: Past Medical History  Diagnosis Date  . Breast cancer ([place])   . Cancer of left breast ([place]) 04/05/2015    PAST SURGICAL HISTORY: Past Surgical History  Procedure Laterality Date  . Mastectomy    . Reconstructive repair sternal Left   . Reduction mammaplasty      FAMILY HISTORY No family history on file.  GYNECOLOGIC HISTORY:  No LMP recorded. Patient is postmenopausal.     ADVANCED DIRECTIVES:    HEALTH MAINTENANCE: Social History  Substance Use Topics  . Smoking status: Never Smoker   . Smokeless tobacco: Not on file  . Alcohol Use: No     Bone density:12/2015  Mammogram:12/2015  Allergies  Allergen Reactions  . Taxotere [Docetaxel] Other (See Comments) and Anaphylaxis    flush and back pain    Current Outpatient Prescriptions  Medication Sig Dispense Refill  . calcium carbonate (OS-CAL) 600 MG TABS tablet Take 600 mg by mouth daily.    Marland Kitchen levothyroxine (SYNTHROID, LEVOTHROID) 75 MCG tablet Take by mouth.    . Multiple Vitamin (MULTIVITAMIN) tablet Take 1 tablet by mouth daily.    . pravastatin (PRAVACHOL) 40 MG tablet Take 40 mg by mouth daily.    Marland Kitchen levothyroxine (SYNTHROID, LEVOTHROID) 50 MCG tablet Take by mouth.     No  current facility-administered medications for this visit.    OBJECTIVE: BP 149/82 mmHg  Pulse 70  Temp(Src) 98.7 F (37.1 C) (Tympanic)  Resp 16  Wt 166 lb 8.9 oz (75.55 kg)   Body mass index is 29.04 kg/(m^2).    ECOG FS:0 - Asymptomatic  General: Well-developed, well-nourished, no acute distress. Eyes: Pink  conjunctiva, anicteric sclera. HEENT: Normocephalic, moist mucous membranes, clear oropharnyx. Lungs: Clear to auscultation bilaterally. Heart: Regular rate and rhythm. No rubs, murmurs, or gallops. Abdomen: Soft, nontender, nondistended. No organomegaly noted, normoactive bowel sounds. Breast: Left breast mastectomy with reconstruction, right breast palpated in a circular manner in the sitting and supine positions.  No masses or fullness palpated.  Axilla palpated in both positions with no masses or fullness palpated.  Musculoskeletal: No edema, cyanosis, or clubbing. Neuro: Alert, answering all questions appropriately. Cranial nerves grossly intact. Skin: No rashes or petechiae noted. Psych: Normal affect. Lymphatics: No cervical, clavicular, or axillary LAD.   LAB RESULTS:  Appointment on 04/07/2016  Component Date Value Ref Range Status  . WBC 04/07/2016 6.1  3.6 - 11.0 K/uL Final  . RBC 04/07/2016 4.37  3.80 - 5.20 MIL/uL Final  . Hemoglobin 04/07/2016 13.9  12.0 - 16.0 g/dL Final  . HCT 04/07/2016 40.6  35.0 - 47.0 % Final  . MCV 04/07/2016 93.0  80.0 - 100.0 fL Final  . MCH 04/07/2016 31.8  26.0 - 34.0 pg Final  . MCHC 04/07/2016 34.1  32.0 - 36.0 g/dL Final  . RDW 04/07/2016 12.7  11.5 - 14.5 % Final  . Platelets 04/07/2016 263  150 - 440 K/uL Final  . Neutrophils Relative % 04/07/2016 61   Final  . Neutro Abs 04/07/2016 3.7  1.4 - 6.5 K/uL Final  . Lymphocytes Relative 04/07/2016 31   Final  . Lymphs Abs 04/07/2016 1.9  1.0 - 3.6 K/uL Final  . Monocytes Relative 04/07/2016 5   Final  . Monocytes Absolute 04/07/2016 0.3  0.2 - 0.9 K/uL Final  . Eosinophils Relative 04/07/2016 2   Final  . Eosinophils Absolute 04/07/2016 0.1  0 - 0.7 K/uL Final  . Basophils Relative 04/07/2016 1   Final  . Basophils Absolute 04/07/2016 0.1  0 - 0.1 K/uL Final  . Sodium 04/07/2016 138  135 - 145 mmol/L Final  . Potassium 04/07/2016 4.2  3.5 - 5.1 mmol/L Final  . Chloride 04/07/2016 102   101 - 111 mmol/L Final  . CO2 04/07/2016 30  22 - 32 mmol/L Final  . Glucose, Bld 04/07/2016 106* 65 - 99 mg/dL Final  . BUN 04/07/2016 18  6 - 20 mg/dL Final  . Creatinine, Ser 04/07/2016 0.75  0.44 - 1.00 mg/dL Final  . Calcium 04/07/2016 9.8  8.9 - 10.3 mg/dL Final  . Total Protein 04/07/2016 7.3  6.5 - 8.1 g/dL Final  . Albumin 04/07/2016 4.5  3.5 - 5.0 g/dL Final  . AST 04/07/2016 23  15 - 41 U/L Final  . ALT 04/07/2016 19  14 - 54 U/L Final  . Alkaline Phosphatase 04/07/2016 68  38 - 126 U/L Final  . Total Bilirubin 04/07/2016 0.6  0.3 - 1.2 mg/dL Final  . GFR calc non Af Amer 04/07/2016 >60  >60 mL/min Final  . GFR calc Af Amer 04/07/2016 >60  >60 mL/min Final   Comment: (NOTE) The eGFR has been calculated using the CKD EPI equation. This calculation has not been validated in all clinical situations. eGFR's persistently <60 mL/min signify possible Chronic Kidney  Disease.   . Anion gap 04/07/2016 6  5 - 15 Final    STUDIES: No results found.  ASSESSMENT:  History of right DCIS in 2006, status post lumpectomy and radiation therapy. History of carcinoma of the left breast in 2009, stage I, T1 N0 M0, ER/PR negative, HER-2 positive.  PLAN:   1. History of carcinoma of the left breast. Status post mastectomy and reconstructive surgery. Patient completed chemotherapy with [facility] as well as 1 year of maintenance Herceptin. She overall reports feeling very well and denies any acute complaints. She is requesting to be released from our care. She reports following closely with her primary care provider Dr. Ginette Pitman.   Discussed with Dr. Oliva Bustard who agrees that patient can be released with close follow-up by her GYN as well as primary care provider. Patient advised to continue with yearly mammograms as well as every two-year DEXA scans. Also advised to continue with monthly self breast exams.  Patient expressed understanding and was in agreement with this plan. She also understands that She  can call clinic at any time with any questions, concerns, or complaints.   Dr. Oliva Bustard was available for consultation and review of plan of care for this patient.  Cancer of left breast [facility])   Staging form: Breast, AJCC 7th Edition     Clinical: Stage IA (T1c, N0, M0) - Unsigned   Evlyn Kanner, NP   04/07/2016 3:46 PM

## 2016-04-08 LAB — CANCER ANTIGEN 27.29: CA 27.29: 21.7 U/mL (ref 0.0–38.6)

## 2016-04-27 DIAGNOSIS — I358 Other nonrheumatic aortic valve disorders: Secondary | ICD-10-CM | POA: Insufficient documentation

## 2016-10-28 ENCOUNTER — Encounter: Payer: Self-pay | Admitting: *Deleted

## 2016-10-29 ENCOUNTER — Encounter: Payer: Self-pay | Admitting: *Deleted

## 2016-10-29 ENCOUNTER — Ambulatory Visit: Payer: BLUE CROSS/BLUE SHIELD | Admitting: Anesthesiology

## 2016-10-29 ENCOUNTER — Ambulatory Visit
Admission: RE | Admit: 2016-10-29 | Discharge: 2016-10-29 | Disposition: A | Discharge status: Home / Self Care | Payer: BLUE CROSS/BLUE SHIELD | Source: Ambulatory Visit | Attending: Gastroenterology | Admitting: Gastroenterology

## 2016-10-29 ENCOUNTER — Encounter
Admission: RE | Disposition: A | Discharge status: Home / Self Care | Payer: Self-pay | Source: Ambulatory Visit | Attending: Gastroenterology

## 2016-10-29 DIAGNOSIS — Z79899 Other long term (current) drug therapy: Secondary | ICD-10-CM | POA: Insufficient documentation

## 2016-10-29 DIAGNOSIS — Z853 Personal history of malignant neoplasm of breast: Secondary | ICD-10-CM | POA: Diagnosis not present

## 2016-10-29 DIAGNOSIS — K648 Other hemorrhoids: Secondary | ICD-10-CM | POA: Diagnosis not present

## 2016-10-29 DIAGNOSIS — E039 Hypothyroidism, unspecified: Secondary | ICD-10-CM | POA: Diagnosis not present

## 2016-10-29 DIAGNOSIS — K5730 Diverticulosis of large intestine without perforation or abscess without bleeding: Secondary | ICD-10-CM | POA: Insufficient documentation

## 2016-10-29 DIAGNOSIS — Z1211 Encounter for screening for malignant neoplasm of colon: Secondary | ICD-10-CM | POA: Insufficient documentation

## 2016-10-29 HISTORY — DX: Hypothyroidism, unspecified: E03.9

## 2016-10-29 HISTORY — PX: COLONOSCOPY WITH PROPOFOL: SHX5780

## 2016-10-29 SURGERY — COLONOSCOPY WITH PROPOFOL
Anesthesia: General

## 2016-10-29 MED ORDER — PROPOFOL 500 MG/50ML IV EMUL
INTRAVENOUS | Status: DC | PRN
  Administered 2016-10-29: 150 ug/kg/min via INTRAVENOUS

## 2016-10-29 MED ORDER — SODIUM CHLORIDE 0.9 % IV SOLN: INTRAVENOUS | Status: DC

## 2016-10-29 MED ORDER — PROPOFOL 10 MG/ML IV BOLUS
INTRAVENOUS | Status: DC | PRN
  Administered 2016-10-29: 60 mg via INTRAVENOUS

## 2016-10-29 MED ORDER — MIDAZOLAM HCL 2 MG/2ML IJ SOLN
INTRAMUSCULAR | Status: DC | PRN
  Administered 2016-10-29: 1 mg via INTRAVENOUS

## 2016-10-29 MED ORDER — LIDOCAINE HCL (PF) 1 % IJ SOLN
INTRAMUSCULAR | Status: AC
  Administered 2016-10-29: 0.3 mL
  Filled 2016-10-29: qty 2

## 2016-10-29 MED ORDER — SODIUM CHLORIDE 0.9 % IV SOLN
INTRAVENOUS | Status: DC
  Administered 2016-10-29: 1000 mL via INTRAVENOUS

## 2016-10-29 NOTE — Op Note (Signed)
[facility] Patient Name: Kathy Wood Procedure Date: 10/29/2016 12:19 PM MRN: [id number] Account #: [id number] Date of Birth: [birth year] Admit Type: Outpatient Age: 65 Room: [facility] ENDO ROOM 3 Gender: Female Note Status: Finalized Procedure:            Colonoscopy Indications:          Screening for colorectal malignant neoplasm Providers:            Lollie Sails, MD Referring MD:         Tracie Harrier, MD (Referring MD) Medicines:            Monitored Anesthesia Care Complications:        No immediate complications. Procedure:            Pre-Anesthesia Assessment:                       - ASA Grade Assessment: II - A patient with mild                        systemic disease.                       After obtaining informed consent, the colonoscope was                        passed under direct vision. Throughout the procedure,                        the patient's blood pressure, pulse, and oxygen                        saturations were monitored continuously. The                        Colonoscope was introduced through the anus and                        advanced to the the cecum, identified by appendiceal                        orifice and ileocecal valve. The colonoscopy was                        performed without difficulty. The patient tolerated the                        procedure well. The quality of the bowel preparation                        was good. Findings:      Multiple small to medium diverticula were found in the sigmoid colon,       descending colon, transverse colon and ascending colon.      Non-bleeding internal hemorrhoids were found during retroflexion and       during anoscopy. The hemorrhoids were small and Grade I (internal       hemorrhoids that do not prolapse).      The digital rectal exam was normal.      The exam was otherwise without abnormality. Impression:           - Diverticulosis in the sigmoid  colon, in the  descending colon, in the transverse colon and in the                        ascending colon.                       - Non-bleeding internal hemorrhoids.                       - No specimens collected. Recommendation:       - Discharge patient to home. Procedure Code(s):    --- Professional ---                       [phone], Colonoscopy, flexible; diagnostic, including                        collection of specimen(s) by brushing or washing, when                        performed (separate procedure) Diagnosis Code(s):    --- Professional ---                       Z12.11, Encounter for screening for malignant neoplasm                        of colon                       K64.0, First degree hemorrhoids                       K57.30, Diverticulosis of large intestine without                        perforation or abscess without bleeding CPT copyright 2016 American Medical Association. All rights reserved. The codes documented in this report are preliminary and upon coder review may  be revised to meet current compliance requirements. Lollie Sails, MD 10/29/2016 12:57:13 PM This report has been signed electronically. Number of Addenda: 0 Note Initiated On: 10/29/2016 12:19 PM Scope Withdrawal Time: 0 hours 8 minutes 21 seconds  Total Procedure Duration: 0 hours 19 minutes 48 seconds       [facility]

## 2016-10-29 NOTE — Anesthesia Preprocedure Evaluation (Signed)
Anesthesia Evaluation  Patient identified by MRN, date of birth, ID band Patient awake    Reviewed: Allergy & Precautions, H&P , NPO status , Patient's Chart, lab work & pertinent test results, reviewed documented beta blocker date and time   History of Anesthesia Complications Negative for: history of anesthetic complications  Airway Mallampati: I  TM Distance: >3 FB Neck ROM: full    Dental no notable dental hx. (+) Caps   Pulmonary neg pulmonary ROS,    Pulmonary exam normal breath sounds clear to auscultation       Cardiovascular Exercise Tolerance: Good (-) hypertension(-) angina(-) CAD, (-) Past MI, (-) Cardiac Stents and (-) CABG Normal cardiovascular exam(-) dysrhythmias + Valvular Problems/Murmurs  Rhythm:regular Rate:Normal     Neuro/Psych negative neurological ROS  negative psych ROS   GI/Hepatic negative GI ROS, Neg liver ROS,   Endo/Other  neg diabetesHypothyroidism   Renal/GU negative Renal ROS  negative genitourinary   Musculoskeletal   Abdominal   Peds  Hematology negative hematology ROS (+)   Anesthesia Other Findings Past Medical History: No date: Breast cancer ([place]) 04/05/2015: Cancer of left breast ([facility]) No date: Hypothyroidism   Reproductive/Obstetrics negative OB ROS                             Anesthesia Physical Anesthesia Plan  ASA: II  Anesthesia Plan: General   Post-op Pain Management:    Induction:   Airway Management Planned:   Additional Equipment:   Intra-op Plan:   Post-operative Plan:   Informed Consent: I have reviewed the patients History and Physical, chart, labs and discussed the procedure including the risks, benefits and alternatives for the proposed anesthesia with the patient or authorized representative who has indicated his/her understanding and acceptance.   Dental Advisory Given  Plan Discussed with: Anesthesiologist, CRNA and  Surgeon  Anesthesia Plan Comments:         Anesthesia Quick Evaluation

## 2016-10-29 NOTE — Transfer of Care (Signed)
Immediate Anesthesia Transfer of Care Note  Patient: [place] ZOGG  Procedure(s) Performed: Procedure(s): COLONOSCOPY WITH PROPOFOL (N/A)  Patient Location: PACU  Anesthesia Type:General  Level of Consciousness: sedated  Airway & Oxygen Therapy: Patient Spontanous Breathing and Patient connected to nasal cannula oxygen  Post-op Assessment: Report given to RN and Post -op Vital signs reviewed and stable  Post vital signs: Reviewed and stable  Last Vitals:  Vitals:   10/29/16 1115 10/29/16 1256  BP: (!) 174/80 (!) 96/52  Pulse: 74 75  Resp: 16 14  Temp: (!) 35.6 C (!) 36.1 C    Last Pain:  Vitals:   10/29/16 1115  TempSrc: Tympanic         Complications: No apparent anesthesia complications

## 2016-10-29 NOTE — H&P (Signed)
Outpatient short stay form Pre-procedure 10/29/2016 12:24 PM Kathy Sails MD  Primary Physician: Dr Tracie Harrier  Reason for visit:  Colonoscopy  History of present illness:  Patient is a 65 year old female presenting today as above. She takes no aspirin or blood thinning agents. Her last colonoscopy was 10 years ago with a finding of diverticulosis otherwise normal. She is returning for repeat screening. sHe tolerated her prep well.    Current Facility-Administered Medications:  .  0.9 %  sodium chloride infusion, , Intravenous, Continuous, Kathy Sails, MD, Last Rate: 20 mL/hr at 10/29/16 1144, 1,000 mL at 10/29/16 1144 .  0.9 %  sodium chloride infusion, , Intravenous, Continuous, Kathy Sails, MD  Prescriptions Prior to Admission  Medication Sig Dispense Refill Last Dose  . levothyroxine (SYNTHROID, LEVOTHROID) 75 MCG tablet Take by mouth.   10/29/2016 at 0800  . calcium carbonate (OS-CAL) 600 MG TABS tablet Take 600 mg by mouth daily.   Taking  . levothyroxine (SYNTHROID, LEVOTHROID) 50 MCG tablet Take by mouth.   Taking  . Multiple Vitamin (MULTIVITAMIN) tablet Take 1 tablet by mouth daily.   Taking  . pravastatin (PRAVACHOL) 40 MG tablet Take 40 mg by mouth daily.   Taking     Allergies  Allergen Reactions  . Taxotere [Docetaxel] Other (See Comments) and Anaphylaxis    flush and back pain     Past Medical History:  Diagnosis Date  . Breast cancer ([place])   . Cancer of left breast ([place]) 04/05/2015  . Hypothyroidism     Review of systems:      Physical Exam    Heart and lungs: Regular rate and rhythm without rub or gallop, lungs are bilaterally clear.    HEENT: Normocephalic atraumatic eyes are anicteric    Other:     Pertinant exam for procedure: Soft nontender nondistended bowel sounds positive normoactive.    Planned proceedures: Colonoscopy and indicated procedures. I have discussed the risks benefits and complications of procedures to  include not limited to bleeding, infection, perforation and the risk of sedation and the patient wishes to proceed.    Kathy Sails, MD Gastroenterology 10/29/2016  12:24 PM

## 2016-10-30 ENCOUNTER — Encounter: Payer: Self-pay | Admitting: Gastroenterology

## 2016-10-31 NOTE — Anesthesia Postprocedure Evaluation (Signed)
Anesthesia Post Note  Patient: Kathy Wood  Procedure(s) Performed: Procedure(s) (LRB): COLONOSCOPY WITH PROPOFOL (N/A)  Patient location during evaluation: Endoscopy Anesthesia Type: General Level of consciousness: awake and alert Pain management: pain level controlled Vital Signs Assessment: post-procedure vital signs reviewed and stable Respiratory status: spontaneous breathing, nonlabored ventilation, respiratory function stable and patient connected to nasal cannula oxygen Cardiovascular status: blood pressure returned to baseline and stable Postop Assessment: no signs of nausea or vomiting Anesthetic complications: no    Last Vitals:  Vitals:   10/29/16 1317 10/29/16 1327  BP: 95/78 125/88  Pulse: 80 69  Resp: 16 13  Temp:      Last Pain:  Vitals:   10/30/16 1035  TempSrc:   PainSc: 0-No pain                 Martha Clan

## 2017-01-03 ENCOUNTER — Other Ambulatory Visit: Payer: Self-pay | Admitting: Internal Medicine

## 2017-01-03 DIAGNOSIS — Z1231 Encounter for screening mammogram for malignant neoplasm of breast: Secondary | ICD-10-CM

## 2017-02-22 ENCOUNTER — Ambulatory Visit
Admission: RE | Admit: 2017-02-22 | Discharge: 2017-02-22 | Disposition: A | Discharge status: Home / Self Care | Payer: BLUE CROSS/BLUE SHIELD | Source: Ambulatory Visit | Attending: Internal Medicine | Admitting: Internal Medicine

## 2017-02-22 DIAGNOSIS — Z1231 Encounter for screening mammogram for malignant neoplasm of breast: Secondary | ICD-10-CM | POA: Insufficient documentation

## 2017-02-22 HISTORY — DX: Personal history of antineoplastic chemotherapy: Z92.21

## 2018-07-21 ENCOUNTER — Other Ambulatory Visit: Payer: Self-pay | Admitting: Internal Medicine

## 2018-07-21 DIAGNOSIS — Z1231 Encounter for screening mammogram for malignant neoplasm of breast: Secondary | ICD-10-CM

## 2018-08-09 ENCOUNTER — Ambulatory Visit
Admission: RE | Admit: 2018-08-09 | Discharge: 2018-08-09 | Disposition: A | Discharge status: Home / Self Care | Payer: Medicare HMO | Source: Ambulatory Visit | Attending: Internal Medicine | Admitting: Internal Medicine

## 2018-08-09 DIAGNOSIS — Z1231 Encounter for screening mammogram for malignant neoplasm of breast: Secondary | ICD-10-CM | POA: Insufficient documentation

## 2018-08-09 HISTORY — DX: Personal history of irradiation: Z92.3

## 2018-10-26 DIAGNOSIS — I77810 Thoracic aortic ectasia: Secondary | ICD-10-CM | POA: Insufficient documentation

## 2018-11-29 HISTORY — PX: CARDIAC CATHETERIZATION: SHX172

## 2018-12-13 DIAGNOSIS — Z952 Presence of prosthetic heart valve: Secondary | ICD-10-CM | POA: Insufficient documentation

## 2018-12-13 HISTORY — PX: CARDIAC VALVE REPLACEMENT: SHX585

## 2018-12-14 DIAGNOSIS — I442 Atrioventricular block, complete: Secondary | ICD-10-CM | POA: Insufficient documentation

## 2018-12-15 DIAGNOSIS — Z950 Presence of cardiac pacemaker: Secondary | ICD-10-CM

## 2018-12-15 HISTORY — PX: INSERT / REPLACE / REMOVE PACEMAKER: SUR710

## 2018-12-15 HISTORY — DX: Presence of cardiac pacemaker: Z95.0

## 2018-12-25 DIAGNOSIS — Z950 Presence of cardiac pacemaker: Secondary | ICD-10-CM | POA: Insufficient documentation

## 2019-01-04 ENCOUNTER — Encounter: Payer: Self-pay | Admitting: *Deleted

## 2019-01-04 ENCOUNTER — Encounter: Payer: Medicare HMO | Attending: Internal Medicine | Admitting: *Deleted

## 2019-01-04 VITALS — Ht 64.5 in | Wt 164.4 lb

## 2019-01-04 DIAGNOSIS — Z7982 Long term (current) use of aspirin: Secondary | ICD-10-CM | POA: Insufficient documentation

## 2019-01-04 DIAGNOSIS — Z9221 Personal history of antineoplastic chemotherapy: Secondary | ICD-10-CM | POA: Diagnosis not present

## 2019-01-04 DIAGNOSIS — Z952 Presence of prosthetic heart valve: Secondary | ICD-10-CM | POA: Insufficient documentation

## 2019-01-04 DIAGNOSIS — Z79890 Hormone replacement therapy (postmenopausal): Secondary | ICD-10-CM | POA: Insufficient documentation

## 2019-01-04 DIAGNOSIS — Z853 Personal history of malignant neoplasm of breast: Secondary | ICD-10-CM | POA: Insufficient documentation

## 2019-01-04 DIAGNOSIS — E039 Hypothyroidism, unspecified: Secondary | ICD-10-CM | POA: Diagnosis not present

## 2019-01-04 DIAGNOSIS — Z923 Personal history of irradiation: Secondary | ICD-10-CM | POA: Diagnosis not present

## 2019-01-04 DIAGNOSIS — Z79899 Other long term (current) drug therapy: Secondary | ICD-10-CM | POA: Insufficient documentation

## 2019-01-04 NOTE — Progress Notes (Signed)
Cardiac Individual Treatment Plan  Patient Details  Name: Kathy Wood MRN: [id number] Date of Birth: 1951/05/15 Referring Provider:     Cardiac Rehab from 01/04/2019 in [facility] Cardiac and Pulmonary Rehab  Referring Provider  Hande      Initial Encounter Date:    Cardiac Rehab from 01/04/2019 in [facility] Cardiac and Pulmonary Rehab  Date  01/04/19      Visit Diagnosis: S/P AVR (aortic valve replacement)  Patient's Home Medications on Admission:  Current Outpatient Medications:  .  alendronate (FOSAMAX) 70 MG tablet, Take 70 mg by mouth once a week., Disp: , Rfl:  .  aspirin 81 MG chewable tablet, Chew by mouth., Disp: , Rfl:  .  calcium carbonate (OS-CAL) 600 MG TABS tablet, Take 600 mg by mouth daily., Disp: , Rfl:  .  Cholecalciferol (VITAMIN D3) 25 MCG (1000 UT) CAPS, Take by mouth., Disp: , Rfl:  .  levothyroxine (SYNTHROID, LEVOTHROID) 75 MCG tablet, , Disp: , Rfl:  .  Multiple Vitamin (MULTIVITAMIN) tablet, Take 1 tablet by mouth daily., Disp: , Rfl:  .  pravastatin (PRAVACHOL) 40 MG tablet, Take 40 mg by mouth daily., Disp: , Rfl:   Past Medical History: Past Medical History:  Diagnosis Date  . 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 Cancer of left breast ([place]) 2009  . Hypothyroidism   . Personal history of chemotherapy 2009  . Personal history of radiation therapy 2006    Tobacco Use: Social History   Tobacco Use  Smoking Status Never Smoker  Smokeless Tobacco Never Used    Labs: Recent Review Flowsheet Data    There is no flowsheet data to display.       Exercise Target Goals: Exercise Program Goal: Individual exercise prescription set using results from initial 6 min walk test and THRR while considering  patient's activity barriers and safety.   Exercise Prescription Goal: Initial exercise prescription builds to 30-45 minutes a day of aerobic activity, 2-3 days per week.  Home exercise  guidelines will be given to patient during program as part of exercise prescription that the participant will acknowledge.  Activity Barriers & Risk Stratification: Activity Barriers & Cardiac Risk Stratification - 01/04/19 1358      Activity Barriers & Cardiac Risk Stratification   Activity Barriers  None    Cardiac Risk Stratification  Low       6 Minute Walk: 6 Minute Walk    Row Name 01/04/19 1423         6 Minute Walk   Phase  Initial     Distance  1400 feet     Walk Time  6 minutes     # of Rest Breaks  0     MPH  2.65     METS  3.6     RPE  11     Perceived Dyspnea   1     VO2 Peak  12.6     Symptoms  No     Resting HR  118 bpm     Resting BP  126/82     Exercise Oxygen Saturation  during 6 min walk  98 %     Max Ex. HR  128 bpm     Max Ex. BP  136/78     2 Minute Post BP  124/76        Oxygen Initial Assessment:   Oxygen Re-Evaluation:  Oxygen Discharge (Final Oxygen Re-Evaluation):   Initial Exercise Prescription: Initial Exercise Prescription - 01/04/19 1400      Date of Initial Exercise RX and Referring Provider   Date  01/04/19    Referring Provider  Hande      Treadmill   MPH  2.6    Grade  1    Minutes  15    METs  3.35      Recumbant Bike   Level  4    RPM  60    Watts  38    Minutes  15    METs  3.6      Elliptical   Level  1    Speed  3    Minutes  15      REL-XR   Level  4    Speed  50    Minutes  15    METs  3.6      Prescription Details   Frequency (times per week)  3    Duration  Progress to 45 minutes of aerobic exercise without signs/symptoms of physical distress      Intensity   THRR 40-80% of Max Heartrate  132-148    Ratings of Perceived Exertion  11-15    Perceived Dyspnea  0-4      Progression   Progression  Continue to progress workloads to maintain intensity without signs/symptoms of physical distress.      Resistance Training   Training Prescription  Yes    Weight  3 lb   no overhead yet    Reps  10-15       Perform Capillary Blood Glucose checks as needed.  Exercise Prescription Changes: Exercise Prescription Changes    Row Name 01/04/19 1400             Response to Exercise   Blood Pressure (Admit)  126/82       Blood Pressure (Exercise)  136/78       Blood Pressure (Exit)  124/76       Heart Rate (Admit)  118 bpm       Heart Rate (Exercise)  128 bpm       Heart Rate (Exit)  123 bpm       Oxygen Saturation (Exit)  98 %       Rating of Perceived Exertion (Exercise)  11          Exercise Comments:   Exercise Goals and Review: Exercise Goals    Row Name 01/04/19 1422             Exercise Goals   Increase Physical Activity  Yes       Intervention  Provide advice, education, support and counseling about physical activity/exercise needs.;Develop an individualized exercise prescription for aerobic and resistive training based on initial evaluation findings, risk stratification, comorbidities and participant's personal goals.       Expected Outcomes  Short Term: Attend rehab on a regular basis to increase amount of physical activity.;Long Term: Add in home exercise to make exercise part of routine and to increase amount of physical activity.;Long Term: Exercising regularly at least 3-5 days a week.       Increase Strength and Stamina  Yes       Intervention  Provide advice, education, support and counseling about physical activity/exercise needs.;Develop an individualized exercise prescription for aerobic and resistive training based on initial evaluation findings, risk stratification, comorbidities and participant's personal goals.       Expected  Outcomes  Short Term: Increase workloads from initial exercise prescription for resistance, speed, and METs.;Short Term: Perform resistance training exercises routinely during rehab and add in resistance training at home;Long Term: Improve cardiorespiratory fitness, muscular endurance and strength as measured by increased  METs and functional capacity (6MWT)       Able to understand and use rate of perceived exertion (RPE) scale  Yes       Intervention  Provide education and explanation on how to use RPE scale       Expected Outcomes  Short Term: Able to use RPE daily in rehab to express subjective intensity level;Long Term:  Able to use RPE to guide intensity level when exercising independently       Knowledge and understanding of Target Heart Rate Range (THRR)  Yes       Intervention  Provide education and explanation of THRR including how the numbers were predicted and where they are located for reference       Expected Outcomes  Short Term: Able to state/look up THRR;Short Term: Able to use daily as guideline for intensity in rehab;Long Term: Able to use THRR to govern intensity when exercising independently       Able to check pulse independently  Yes       Intervention  Provide education and demonstration on how to check pulse in carotid and radial arteries.;Review the importance of being able to check your own pulse for safety during independent exercise       Expected Outcomes  Short Term: Able to explain why pulse checking is important during independent exercise;Long Term: Able to check pulse independently and accurately       Understanding of Exercise Prescription  Yes       Intervention  Provide education, explanation, and written materials on patient's individual exercise prescription       Expected Outcomes  Short Term: Able to explain program exercise prescription;Long Term: Able to explain home exercise prescription to exercise independently          Exercise Goals Re-Evaluation :   Discharge Exercise Prescription (Final Exercise Prescription Changes): Exercise Prescription Changes - 01/04/19 1400      Response to Exercise   Blood Pressure (Admit)  126/82    Blood Pressure (Exercise)  136/78    Blood Pressure (Exit)  124/76    Heart Rate (Admit)  118 bpm    Heart Rate (Exercise)  128 bpm     Heart Rate (Exit)  123 bpm    Oxygen Saturation (Exit)  98 %    Rating of Perceived Exertion (Exercise)  11       Nutrition:  Target Goals: Understanding of nutrition guidelines, daily intake of sodium '1500mg'$ , cholesterol '200mg'$ , calories 30% from fat and 7% or less from saturated fats, daily to have 5 or more servings of fruits and vegetables.  Biometrics: Pre Biometrics - 01/04/19 1422      Pre Biometrics   Height  5' 4.5" (1.638 m)    Weight  164 lb 6.4 oz (74.6 kg)    Waist Circumference  35.5 inches    Hip Circumference  41 inches    Waist to Hip Ratio  0.87 %    BMI (Calculated)  27.79    Single Leg Stand  30 seconds        Nutrition Therapy Plan and Nutrition Goals: Nutrition Therapy & Goals - 01/04/19 1355      Intervention Plan   Intervention  Prescribe, educate and counsel  regarding individualized specific dietary modifications aiming towards targeted core components such as weight, hypertension, lipid management, diabetes, heart failure and other comorbidities.;Nutrition handout(s) given to patient.    Expected Outcomes  Short Term Goal: Understand basic principles of dietary content, such as calories, fat, sodium, cholesterol and nutrients.;Short Term Goal: A plan has been developed with personal nutrition goals set during dietitian appointment.;Long Term Goal: Adherence to prescribed nutrition plan.       Nutrition Assessments: Nutrition Assessments - 01/04/19 1355      MEDFICTS Scores   Pre Score  31       Nutrition Goals Re-Evaluation:   Nutrition Goals Discharge (Final Nutrition Goals Re-Evaluation):   Psychosocial: Target Goals: Acknowledge presence or absence of significant depression and/or stress, maximize coping skills, provide positive support system. Participant is able to verbalize types and ability to use techniques and skills needed for reducing stress and depression.   Initial Review & Psychosocial Screening: Initial Psych Review &  Screening - 01/04/19 1350      Initial Review   Current issues with  Current Stress Concerns    Source of Stress Concerns  Unable to participate in former interests or hobbies    Comments  Debbie's shortness of breath has become an issue after her valve replacement. She has lived a very active lifestyle and is looking to get back to that. She knows it will take time and is very motivated to get going.       Family Dynamics   Good Support System?  Yes   spouse     Barriers   Psychosocial barriers to participate in program  There are no identifiable barriers or psychosocial needs.;The patient should benefit from training in stress management and relaxation.      Screening Interventions   Interventions  Encouraged to exercise;Program counselor consult;Provide feedback about the scores to participant;To provide support and resources with identified psychosocial needs    Expected Outcomes  Short Term goal: Utilizing psychosocial counselor, staff and physician to assist with identification of specific Stressors or current issues interfering with healing process. Setting desired goal for each stressor or current issue identified.;Long Term Goal: Stressors or current issues are controlled or eliminated.;Short Term goal: Identification and review with participant of any Quality of Life or Depression concerns found by scoring the questionnaire.;Long Term goal: The participant improves quality of Life and PHQ9 Scores as seen by post scores and/or verbalization of changes       Quality of Life Scores:  Quality of Life - 01/04/19 1355      Quality of Life   Select  Quality of Life      Quality of Life Scores   Health/Function Pre  25.9 %    Socioeconomic Pre  30 %    Psych/Spiritual Pre  30 %    Family Pre  30 %    GLOBAL Pre  28.14 %      Scores of 19 and below usually indicate a poorer quality of life in these areas.  A difference of  2-3 points is a clinically meaningful difference.  A  difference of 2-3 points in the total score of the Quality of Life Index has been associated with significant improvement in overall quality of life, self-image, physical symptoms, and general health in studies assessing change in quality of life.  PHQ-9: Recent Review Flowsheet Data    Depression screen [facility] 2/9 01/04/2019   Decreased Interest 0   Down, Depressed, Hopeless 0   PHQ -  2 Score 0   Altered sleeping 0   Tired, decreased energy 1   Change in appetite 1   Feeling bad or failure about yourself  0   Trouble concentrating 0   Moving slowly or fidgety/restless 0   PHQ-9 Score 2   Difficult doing work/chores Not difficult at all     Interpretation of Total Score  Total Score Depression Severity:  1-4 = Minimal depression, 5-9 = Mild depression, 10-14 = Moderate depression, 15-19 = Moderately severe depression, 20-27 = Severe depression   Psychosocial Evaluation and Intervention:   Psychosocial Re-Evaluation:   Psychosocial Discharge (Final Psychosocial Re-Evaluation):   Vocational Rehabilitation: Provide vocational rehab assistance to qualifying candidates.   Vocational Rehab Evaluation & Intervention: Vocational Rehab - 01/04/19 1350      Initial Vocational Rehab Evaluation & Intervention   Assessment shows need for Vocational Rehabilitation  No       Education: Education Goals: Education classes will be provided on a variety of topics geared toward better understanding of heart health and risk factor modification. Participant will state understanding/return demonstration of topics presented as noted by education test scores.  Learning Barriers/Preferences: Learning Barriers/Preferences - 01/04/19 1350      Learning Barriers/Preferences   Learning Barriers  None    Learning Preferences  None       Education Topics:  AED/CPR: - Group verbal and written instruction with the use of models to demonstrate the basic use of the AED with the basic ABC's of  resuscitation.   General Nutrition Guidelines/Fats and Fiber: -Group instruction provided by verbal, written material, models and posters to present the general guidelines for heart healthy nutrition. Gives an explanation and review of dietary fats and fiber.   Controlling Sodium/Reading Food Labels: -Group verbal and written material supporting the discussion of sodium use in heart healthy nutrition. Review and explanation with models, verbal and written materials for utilization of the food label.   Exercise Physiology & General Exercise Guidelines: - Group verbal and written instruction with models to review the exercise physiology of the cardiovascular system and associated critical values. Provides general exercise guidelines with specific guidelines to those with heart or lung disease.    Aerobic Exercise & Resistance Training: - Gives group verbal and written instruction on the various components of exercise. Focuses on aerobic and resistive training programs and the benefits of this training and how to safely progress through these programs..   Flexibility, Balance, Mind/Body Relaxation: Provides group verbal/written instruction on the benefits of flexibility and balance training, including mind/body exercise modes such as yoga, pilates and tai chi.  Demonstration and skill practice provided.   Stress and Anxiety: - Provides group verbal and written instruction about the health risks of elevated stress and causes of high stress.  Discuss the correlation between heart/lung disease and anxiety and treatment options. Review healthy ways to manage with stress and anxiety.   Depression: - Provides group verbal and written instruction on the correlation between heart/lung disease and depressed mood, treatment options, and the stigmas associated with seeking treatment.   Anatomy & Physiology of the Heart: - Group verbal and written instruction and models provide basic cardiac anatomy  and physiology, with the coronary electrical and arterial systems. Review of Valvular disease and Heart Failure   Cardiac Procedures: - Group verbal and written instruction to review commonly prescribed medications for heart disease. Reviews the medication, class of the drug, and side effects. Includes the steps to properly store meds and maintain the  prescription regimen. (beta blockers and nitrates)   Cardiac Medications I: - Group verbal and written instruction to review commonly prescribed medications for heart disease. Reviews the medication, class of the drug, and side effects. Includes the steps to properly store meds and maintain the prescription regimen.   Cardiac Medications II: -Group verbal and written instruction to review commonly prescribed medications for heart disease. Reviews the medication, class of the drug, and side effects. (all other drug classes)    Go Sex-Intimacy & Heart Disease, Get SMART - Goal Setting: - Group verbal and written instruction through game format to discuss heart disease and the return to sexual intimacy. Provides group verbal and written material to discuss and apply goal setting through the application of the S.M.A.R.T. Method.   Other Matters of the Heart: - Provides group verbal, written materials and models to describe Stable Angina and Peripheral Artery. Includes description of the disease process and treatment options available to the cardiac patient.   Exercise & Equipment Safety: - Individual verbal instruction and demonstration of equipment use and safety with use of the equipment.   Cardiac Rehab from 01/04/2019 in [facility] Cardiac and Pulmonary Rehab  Date  01/04/19  Educator  [place]  Instruction Review Code  1- Verbalizes Understanding      Infection Prevention: - Provides verbal and written material to individual with discussion of infection control including proper hand washing and proper equipment cleaning during exercise session.    Cardiac Rehab from 01/04/2019 in [facility] Cardiac and Pulmonary Rehab  Date  01/04/19  Educator  [facility]  Instruction Review Code  1- Verbalizes Understanding      Falls Prevention: - Provides verbal and written material to individual with discussion of falls prevention and safety.   Cardiac Rehab from 01/04/2019 in [facility] Cardiac and Pulmonary Rehab  Date  01/04/19  Educator  [facility]  Instruction Review Code  1- Verbalizes Understanding      Diabetes: - Individual verbal and written instruction to review signs/symptoms of diabetes, desired ranges of glucose level fasting, after meals and with exercise. Acknowledge that pre and post exercise glucose checks will be done for 3 sessions at entry of program.   Know Your Numbers and Risk Factors: -Group verbal and written instruction about important numbers in your health.  Discussion of what are risk factors and how they play a role in the disease process.  Review of Cholesterol, Blood Pressure, Diabetes, and BMI and the role they play in your overall health.   Sleep Hygiene: -Provides group verbal and written instruction about how sleep can affect your health.  Define sleep hygiene, discuss sleep cycles and impact of sleep habits. Review good sleep hygiene tips.    Other: -Provides group and verbal instruction on various topics (see comments)   Knowledge Questionnaire Score: Knowledge Questionnaire Score - 01/04/19 1350      Knowledge Questionnaire Score   Pre Score  26/26       Core Components/Risk Factors/Patient Goals at Admission: Personal Goals and Risk Factors at Admission - 01/04/19 1349      Core Components/Risk Factors/Patient Goals on Admission    Weight Management  Yes;Weight Loss    Intervention  Weight Management: Develop a combined nutrition and exercise program designed to reach desired caloric intake, while maintaining appropriate intake of nutrient and fiber, sodium and fats, and appropriate energy expenditure required for  the weight goal.;Weight Management: Provide education and appropriate resources to help participant work on and attain dietary goals.;Weight Management/Obesity: Establish reasonable short  term and long term weight goals.    Admit Weight  164 lb 6.4 oz (74.6 kg)   wants to gain muscle back as well as lose weight   Expected Outcomes  Short Term: Continue to assess and modify interventions until short term weight is achieved;Long Term: Adherence to nutrition and physical activity/exercise program aimed toward attainment of established weight goal;Weight Loss: Understanding of general recommendations for a balanced deficit meal plan, which promotes 1-2 lb weight loss per week and includes a negative energy balance of [id number] kcal/d;Understanding recommendations for meals to include 15-35% energy as protein, 25-35% energy from fat, 35-60% energy from carbohydrates, less than '200mg'$  of dietary cholesterol, 20-35 gm of total fiber daily;Understanding of distribution of calorie intake throughout the day with the consumption of 4-5 meals/snacks    Lipids  Yes    Intervention  Provide education and support for participant on nutrition & aerobic/resistive exercise along with prescribed medications to achieve LDL '70mg'$ , HDL >'40mg'$ .    Expected Outcomes  Short Term: Participant states understanding of desired cholesterol values and is compliant with medications prescribed. Participant is following exercise prescription and nutrition guidelines.;Long Term: Cholesterol controlled with medications as prescribed, with individualized exercise RX and with personalized nutrition plan. Value goals: LDL < '70mg'$ , HDL > 40 mg.       Core Components/Risk Factors/Patient Goals Review:    Core Components/Risk Factors/Patient Goals at Discharge (Final Review):    ITP Comments: ITP Comments    Row Name 01/04/19 1337           ITP Comments  Med Review completed. Initial ITP created. Diagnosis can be found in [facility] 1/28           Comments: Initial ITP

## 2019-01-04 NOTE — Progress Notes (Signed)
Daily Session Note  Patient Details  Name: Kathy Wood MRN: [id number] Date of Birth: [birth year] Referring Provider:     Cardiac Rehab from 01/04/2019 in [facility] Cardiac and Pulmonary Rehab  Referring Provider  Hande      Encounter Date: 01/04/2019  Check In: Session Check In - 01/04/19 1335      Check-In   Supervising physician immediately available to respond to emergencies  See telemetry face sheet for immediately available ER MD    Location  [facility]-Cardiac & Pulmonary Rehab    Staff Present  Renita Papa, RN Vickki Hearing, BA, ACSM CEP, Exercise Physiologist    Warm-up and Cool-down  Not performed (comment)   Med Review   Resistance Training Performed  Yes    VAD Patient?  No    PAD/SET Patient?  No      Pain Assessment   Currently in Pain?  No/denies        Exercise Prescription Changes - 01/04/19 1400      Response to Exercise   Blood Pressure (Admit)  126/82    Blood Pressure (Exercise)  136/78    Blood Pressure (Exit)  124/76    Heart Rate (Admit)  118 bpm    Heart Rate (Exercise)  128 bpm    Heart Rate (Exit)  123 bpm    Oxygen Saturation (Exit)  98 %    Rating of Perceived Exertion (Exercise)  11       Social History   Tobacco Use  Smoking Status Never Smoker  Smokeless Tobacco Never Used    Goals Met:  Proper associated with RPD/PD & O2 Sat Exercise tolerated well Personal goals reviewed No report of cardiac concerns or symptoms Strength training completed today  Goals Unmet:  Not Applicable  Comments: Med review completed   Dr. Emily Filbert is Medical Director for [place] and [facility].

## 2019-01-04 NOTE — Patient Instructions (Signed)
Patient Instructions  Patient Details  Name: Kathy Wood MRN: [id number] Date of Birth: June 11, 1951 Referring Provider:  Tracie Harrier, MD  Below are your personal goals for exercise, nutrition, and risk factors. Our goal is to help you stay on track towards obtaining and maintaining these goals. We will be discussing your progress on these goals with you throughout the program.  Initial Exercise Prescription: Initial Exercise Prescription - 01/04/19 1400      Date of Initial Exercise RX and Referring Provider   Date  01/04/19    Referring Provider  Hande      Treadmill   MPH  2.6    Grade  1    Minutes  15    METs  3.35      Recumbant Bike   Level  4    RPM  60    Watts  38    Minutes  15    METs  3.6      Elliptical   Level  1    Speed  3    Minutes  15      REL-XR   Level  4    Speed  50    Minutes  15    METs  3.6      Prescription Details   Frequency (times per week)  3    Duration  Progress to 45 minutes of aerobic exercise without signs/symptoms of physical distress      Intensity   THRR 40-80% of Max Heartrate  132-148    Ratings of Perceived Exertion  11-15    Perceived Dyspnea  0-4      Progression   Progression  Continue to progress workloads to maintain intensity without signs/symptoms of physical distress.      Resistance Training   Training Prescription  Yes    Weight  3 lb   no overhead yet   Reps  10-15       Exercise Goals: Frequency: Be able to perform aerobic exercise two to three times per week in program working toward 2-5 days per week of home exercise.  Intensity: Work with a perceived exertion of 11 (fairly light) - 15 (hard) while following your exercise prescription.  We will make changes to your prescription with you as you progress through the program.   Duration: Be able to do 30 to 45 minutes of continuous aerobic exercise in addition to a 5 minute warm-up and a 5 minute cool-down routine.   Nutrition Goals: Your  personal nutrition goals will be established when you do your nutrition analysis with the dietician.  The following are general nutrition guidelines to follow: Cholesterol < 200mg /day Sodium < 1500mg /day Fiber: Women over 50 yrs - 21 grams per day  Personal Goals: Personal Goals and Risk Factors at Admission - 01/04/19 1349      Core Components/Risk Factors/Patient Goals on Admission    Weight Management  Yes;Weight Loss    Intervention  Weight Management: Develop a combined nutrition and exercise program designed to reach desired caloric intake, while maintaining appropriate intake of nutrient and fiber, sodium and fats, and appropriate energy expenditure required for the weight goal.;Weight Management: Provide education and appropriate resources to help participant work on and attain dietary goals.;Weight Management/Obesity: Establish reasonable short term and long term weight goals.    Admit Weight  164 lb 6.4 oz (74.6 kg)   wants to gain muscle back as well as lose weight   Expected Outcomes  Short Term: Continue  to assess and modify interventions until short term weight is achieved;Long Term: Adherence to nutrition and physical activity/exercise program aimed toward attainment of established weight goal;Weight Loss: Understanding of general recommendations for a balanced deficit meal plan, which promotes 1-2 lb weight loss per week and includes a negative energy balance of [phone] kcal/d;Understanding recommendations for meals to include 15-35% energy as protein, 25-35% energy from fat, 35-60% energy from carbohydrates, less than 200mg  of dietary cholesterol, 20-35 gm of total fiber daily;Understanding of distribution of calorie intake throughout the day with the consumption of 4-5 meals/snacks    Lipids  Yes    Intervention  Provide education and support for participant on nutrition & aerobic/resistive exercise along with prescribed medications to achieve LDL 70mg , HDL >40mg .    Expected  Outcomes  Short Term: Participant states understanding of desired cholesterol values and is compliant with medications prescribed. Participant is following exercise prescription and nutrition guidelines.;Long Term: Cholesterol controlled with medications as prescribed, with individualized exercise RX and with personalized nutrition plan. Value goals: LDL < 70mg , HDL > 40 mg.       Tobacco Use Initial Evaluation: Social History   Tobacco Use  Smoking Status Never Smoker  Smokeless Tobacco Never Used    Exercise Goals and Review: Exercise Goals    Row Name 01/04/19 1422             Exercise Goals   Increase Physical Activity  Yes       Intervention  Provide advice, education, support and counseling about physical activity/exercise needs.;Develop an individualized exercise prescription for aerobic and resistive training based on initial evaluation findings, risk stratification, comorbidities and participant's personal goals.       Expected Outcomes  Short Term: Attend rehab on a regular basis to increase amount of physical activity.;Long Term: Add in home exercise to make exercise part of routine and to increase amount of physical activity.;Long Term: Exercising regularly at least 3-5 days a week.       Increase Strength and Stamina  Yes       Intervention  Provide advice, education, support and counseling about physical activity/exercise needs.;Develop an individualized exercise prescription for aerobic and resistive training based on initial evaluation findings, risk stratification, comorbidities and participant's personal goals.       Expected Outcomes  Short Term: Increase workloads from initial exercise prescription for resistance, speed, and METs.;Short Term: Perform resistance training exercises routinely during rehab and add in resistance training at home;Long Term: Improve cardiorespiratory fitness, muscular endurance and strength as measured by increased METs and functional capacity  (6MWT)       Able to understand and use rate of perceived exertion (RPE) scale  Yes       Intervention  Provide education and explanation on how to use RPE scale       Expected Outcomes  Short Term: Able to use RPE daily in rehab to express subjective intensity level;Long Term:  Able to use RPE to guide intensity level when exercising independently       Knowledge and understanding of Target Heart Rate Range (THRR)  Yes       Intervention  Provide education and explanation of THRR including how the numbers were predicted and where they are located for reference       Expected Outcomes  Short Term: Able to state/look up THRR;Short Term: Able to use daily as guideline for intensity in rehab;Long Term: Able to use THRR to govern intensity when exercising independently  Able to check pulse independently  Yes       Intervention  Provide education and demonstration on how to check pulse in carotid and radial arteries.;Review the importance of being able to check your own pulse for safety during independent exercise       Expected Outcomes  Short Term: Able to explain why pulse checking is important during independent exercise;Long Term: Able to check pulse independently and accurately       Understanding of Exercise Prescription  Yes       Intervention  Provide education, explanation, and written materials on patient's individual exercise prescription       Expected Outcomes  Short Term: Able to explain program exercise prescription;Long Term: Able to explain home exercise prescription to exercise independently          Copy of goals given to participant.

## 2019-01-08 ENCOUNTER — Encounter: Payer: Medicare HMO | Admitting: *Deleted

## 2019-01-08 DIAGNOSIS — Z952 Presence of prosthetic heart valve: Secondary | ICD-10-CM | POA: Diagnosis not present

## 2019-01-08 NOTE — Progress Notes (Signed)
Daily Session Note  Patient Details  Name: Kathy Wood MRN: [id number] Date of Birth: 1951/08/27 Referring Provider:     Cardiac Rehab from 01/04/2019 in [facility]  Referring Provider  Hande      Encounter Date: 01/08/2019  Check In: Session Check In - 01/08/19 0751      Check-In   Supervising physician immediately available to respond to emergencies  See telemetry face sheet for immediately available ER MD    Location  [facility]-Cardiac & Pulmonary Rehab    Staff Present  Earlean Shawl, BS, ACSM CEP, Exercise Physiologist;Susanne Bice, RN, BSN, CCRP;Jessica Glenfield, MA, RCEP, CCRP, Exercise Physiologist    Medication changes reported      No    Fall or balance concerns reported     No    Tobacco Cessation  No Change    Warm-up and Cool-down  Performed as group-led instruction    Resistance Training Performed  Yes    VAD Patient?  No    PAD/SET Patient?  No      Pain Assessment   Currently in Pain?  No/denies    Multiple Pain Sites  No          Social History   Tobacco Use  Smoking Status Never Smoker  Smokeless Tobacco Never Used    Goals Met:  Exercise tolerated well Personal goals reviewed No report of cardiac concerns or symptoms Strength training completed today  Goals Unmet:  Not Applicable  Comments: First full day of exercise!  Patient was oriented to gym and equipment including functions, settings, policies, and procedures.  Patient's individual exercise prescription and treatment plan were reviewed.  All starting workloads were established based on the results of the 6 minute walk test done at initial orientation visit.  The plan for exercise progression was also introduced and progression will be customized based on patient's performance and goals.    Dr. Emily Filbert is Medical Director for [facility] and [facility].

## 2019-01-10 ENCOUNTER — Encounter: Payer: Self-pay | Admitting: *Deleted

## 2019-01-10 DIAGNOSIS — Z952 Presence of prosthetic heart valve: Secondary | ICD-10-CM | POA: Diagnosis not present

## 2019-01-10 NOTE — Progress Notes (Signed)
Daily Session Note  Patient Details  Name: Kathy Wood MRN: [id number] Date of Birth: 1951/01/20 Referring Provider:     Cardiac Rehab from 01/04/2019 in [facility]  Referring Provider  Hande      Encounter Date: 01/10/2019  Check In: Session Check In - 01/10/19 0740      Check-In   Supervising physician immediately available to respond to emergencies  See telemetry face sheet for immediately available ER MD    Location  [facility]    Staff Present  Justin Mend RCP,RRT,BSRT;Heath Lark, RN, BSN, CCRP;Jessica Luan Pulling, MA, RCEP, CCRP, Exercise Physiologist    Medication changes reported      No    Fall or balance concerns reported     No    Warm-up and Cool-down  Performed as group-led instruction    Resistance Training Performed  Yes    VAD Patient?  No    PAD/SET Patient?  No      Pain Assessment   Currently in Pain?  No/denies          Social History   Tobacco Use  Smoking Status Never Smoker  Smokeless Tobacco Never Used    Goals Met:  Independence with exercise equipment Exercise tolerated well No report of cardiac concerns or symptoms Strength training completed today  Goals Unmet:  Not Applicable  Comments: Pt able to follow exercise prescription today without complaint.  Will continue to monitor for progression.    Dr. Emily Filbert is Medical Director for [facility].

## 2019-01-10 NOTE — Progress Notes (Signed)
Cardiac Individual Treatment Plan  Patient Details  Name: Kathy Wood MRN: [id number] Date of Birth: 1951/05/15 Referring Provider:     Cardiac Rehab from 01/04/2019 in [facility] Cardiac and Pulmonary Rehab  Referring Provider  Hande      Initial Encounter Date:    Cardiac Rehab from 01/04/2019 in [facility] Cardiac and Pulmonary Rehab  Date  01/04/19      Visit Diagnosis: S/P AVR (aortic valve replacement)  Patient's Home Medications on Admission:  Current Outpatient Medications:  .  alendronate (FOSAMAX) 70 MG tablet, Take 70 mg by mouth once a week., Disp: , Rfl:  .  aspirin 81 MG chewable tablet, Chew by mouth., Disp: , Rfl:  .  calcium carbonate (OS-CAL) 600 MG TABS tablet, Take 600 mg by mouth daily., Disp: , Rfl:  .  Cholecalciferol (VITAMIN D3) 25 MCG (1000 UT) CAPS, Take by mouth., Disp: , Rfl:  .  levothyroxine (SYNTHROID, LEVOTHROID) 75 MCG tablet, , Disp: , Rfl:  .  Multiple Vitamin (MULTIVITAMIN) tablet, Take 1 tablet by mouth daily., Disp: , Rfl:  .  pravastatin (PRAVACHOL) 40 MG tablet, Take 40 mg by mouth daily., Disp: , Rfl:   Past Medical History: Past Medical History:  Diagnosis Date  . 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 Cancer of left breast ([place]) 2009  . Hypothyroidism   . Personal history of chemotherapy 2009  . Personal history of radiation therapy 2006    Tobacco Use: Social History   Tobacco Use  Smoking Status Never Smoker  Smokeless Tobacco Never Used    Labs: Recent Review Flowsheet Data    There is no flowsheet data to display.       Exercise Target Goals: Exercise Program Goal: Individual exercise prescription set using results from initial 6 min walk test and THRR while considering  patient's activity barriers and safety.   Exercise Prescription Goal: Initial exercise prescription builds to 30-45 minutes a day of aerobic activity, 2-3 days per week.  Home exercise  guidelines will be given to patient during program as part of exercise prescription that the participant will acknowledge.  Activity Barriers & Risk Stratification: Activity Barriers & Cardiac Risk Stratification - 01/04/19 1358      Activity Barriers & Cardiac Risk Stratification   Activity Barriers  None    Cardiac Risk Stratification  Low       6 Minute Walk: 6 Minute Walk    Row Name 01/04/19 1423         6 Minute Walk   Phase  Initial     Distance  1400 feet     Walk Time  6 minutes     # of Rest Breaks  0     MPH  2.65     METS  3.6     RPE  11     Perceived Dyspnea   1     VO2 Peak  12.6     Symptoms  No     Resting HR  118 bpm     Resting BP  126/82     Exercise Oxygen Saturation  during 6 min walk  98 %     Max Ex. HR  128 bpm     Max Ex. BP  136/78     2 Minute Post BP  124/76        Oxygen Initial Assessment:   Oxygen Re-Evaluation:  Oxygen Discharge (Final Oxygen Re-Evaluation):   Initial Exercise Prescription: Initial Exercise Prescription - 01/04/19 1400      Date of Initial Exercise RX and Referring Provider   Date  01/04/19    Referring Provider  Hande      Treadmill   MPH  2.6    Grade  1    Minutes  15    METs  3.35      Recumbant Bike   Level  4    RPM  60    Watts  38    Minutes  15    METs  3.6      Elliptical   Level  1    Speed  3    Minutes  15      REL-XR   Level  4    Speed  50    Minutes  15    METs  3.6      Prescription Details   Frequency (times per week)  3    Duration  Progress to 45 minutes of aerobic exercise without signs/symptoms of physical distress      Intensity   THRR 40-80% of Max Heartrate  132-148    Ratings of Perceived Exertion  11-15    Perceived Dyspnea  0-4      Progression   Progression  Continue to progress workloads to maintain intensity without signs/symptoms of physical distress.      Resistance Training   Training Prescription  Yes    Weight  3 lb   no overhead yet    Reps  10-15       Perform Capillary Blood Glucose checks as needed.  Exercise Prescription Changes: Exercise Prescription Changes    Row Name 01/04/19 1400             Response to Exercise   Blood Pressure (Admit)  126/82       Blood Pressure (Exercise)  136/78       Blood Pressure (Exit)  124/76       Heart Rate (Admit)  118 bpm       Heart Rate (Exercise)  128 bpm       Heart Rate (Exit)  123 bpm       Oxygen Saturation (Exit)  98 %       Rating of Perceived Exertion (Exercise)  11          Exercise Comments: Exercise Comments    Row Name 01/08/19 [phone]           Exercise Comments   First full day of exercise!  Patient was oriented to gym and equipment including functions, settings, policies, and procedures.  Patient's individual exercise prescription and treatment plan were reviewed.  All starting workloads were established based on the results of the 6 minute walk test done at initial orientation visit.  The plan for exercise progression was also introduced and progression will be customized based on patient's performance and goals.          Exercise Goals and Review: Exercise Goals    Row Name 01/04/19 1422             Exercise Goals   Increase Physical Activity  Yes       Intervention  Provide advice, education, support and counseling about physical activity/exercise needs.;Develop an individualized exercise prescription for aerobic and resistive training based on initial evaluation findings, risk stratification, comorbidities and participant's personal goals.       Expected Outcomes  Short Term: Attend rehab on a regular basis to increase amount of physical activity.;Long Term: Add in home exercise to make exercise part of routine and to increase amount of physical activity.;Long Term: Exercising regularly at least 3-5 days a week.       Increase Strength and Stamina  Yes       Intervention  Provide advice, education, support and counseling about physical  activity/exercise needs.;Develop an individualized exercise prescription for aerobic and resistive training based on initial evaluation findings, risk stratification, comorbidities and participant's personal goals.       Expected Outcomes  Short Term: Increase workloads from initial exercise prescription for resistance, speed, and METs.;Short Term: Perform resistance training exercises routinely during rehab and add in resistance training at home;Long Term: Improve cardiorespiratory fitness, muscular endurance and strength as measured by increased METs and functional capacity (6MWT)       Able to understand and use rate of perceived exertion (RPE) scale  Yes       Intervention  Provide education and explanation on how to use RPE scale       Expected Outcomes  Short Term: Able to use RPE daily in rehab to express subjective intensity level;Long Term:  Able to use RPE to guide intensity level when exercising independently       Knowledge and understanding of Target Heart Rate Range (THRR)  Yes       Intervention  Provide education and explanation of THRR including how the numbers were predicted and where they are located for reference       Expected Outcomes  Short Term: Able to state/look up THRR;Short Term: Able to use daily as guideline for intensity in rehab;Long Term: Able to use THRR to govern intensity when exercising independently       Able to check pulse independently  Yes       Intervention  Provide education and demonstration on how to check pulse in carotid and radial arteries.;Review the importance of being able to check your own pulse for safety during independent exercise       Expected Outcomes  Short Term: Able to explain why pulse checking is important during independent exercise;Long Term: Able to check pulse independently and accurately       Understanding of Exercise Prescription  Yes       Intervention  Provide education, explanation, and written materials on patient's individual  exercise prescription       Expected Outcomes  Short Term: Able to explain program exercise prescription;Long Term: Able to explain home exercise prescription to exercise independently          Exercise Goals Re-Evaluation : Exercise Goals Re-Evaluation    Row Name 01/08/19 0752             Exercise Goal Re-Evaluation   Exercise Goals Review  Increase Physical Activity;Increase Strength and Stamina;Able to understand and use rate of perceived exertion (RPE) scale;Knowledge and understanding of Target Heart Rate Range (THRR);Understanding of Exercise Prescription       Comments  Reviewed RPE scale, THR and program prescription with pt today.  Pt voiced understanding and was given a copy of goals to take home.        Expected Outcomes  Short: Use RPE daily to regulate intensity. Long: Follow program prescription in THR.          Discharge Exercise Prescription (Final Exercise Prescription Changes): Exercise Prescription Changes - 01/04/19 1400      Response to Exercise   Blood  Pressure (Admit)  126/82    Blood Pressure (Exercise)  136/78    Blood Pressure (Exit)  124/76    Heart Rate (Admit)  118 bpm    Heart Rate (Exercise)  128 bpm    Heart Rate (Exit)  123 bpm    Oxygen Saturation (Exit)  98 %    Rating of Perceived Exertion (Exercise)  11       Nutrition:  Target Goals: Understanding of nutrition guidelines, daily intake of sodium <1515m, cholesterol <2061m calories 30% from fat and 7% or less from saturated fats, daily to have 5 or more servings of fruits and vegetables.  Biometrics: Pre Biometrics - 01/04/19 1422      Pre Biometrics   Height  5' 4.5" (1.638 m)    Weight  164 lb 6.4 oz (74.6 kg)    Waist Circumference  35.5 inches    Hip Circumference  41 inches    Waist to Hip Ratio  0.87 %    BMI (Calculated)  27.79    Single Leg Stand  30 seconds        Nutrition Therapy Plan and Nutrition Goals: Nutrition Therapy & Goals - 01/04/19 1355       Intervention Plan   Intervention  Prescribe, educate and counsel regarding individualized specific dietary modifications aiming towards targeted core components such as weight, hypertension, lipid management, diabetes, heart failure and other comorbidities.;Nutrition handout(s) given to patient.    Expected Outcomes  Short Term Goal: Understand basic principles of dietary content, such as calories, fat, sodium, cholesterol and nutrients.;Short Term Goal: A plan has been developed with personal nutrition goals set during dietitian appointment.;Long Term Goal: Adherence to prescribed nutrition plan.       Nutrition Assessments: Nutrition Assessments - 01/04/19 1355      MEDFICTS Scores   Pre Score  31       Nutrition Goals Re-Evaluation:   Nutrition Goals Discharge (Final Nutrition Goals Re-Evaluation):   Psychosocial: Target Goals: Acknowledge presence or absence of significant depression and/or stress, maximize coping skills, provide positive support system. Participant is able to verbalize types and ability to use techniques and skills needed for reducing stress and depression.   Initial Review & Psychosocial Screening: Initial Psych Review & Screening - 01/04/19 1350      Initial Review   Current issues with  Current Stress Concerns    Source of Stress Concerns  Unable to participate in former interests or hobbies    Comments  Kathy Wood's shortness of breath has become an issue after her valve replacement. She has lived a very active lifestyle and is looking to get back to that. She knows it will take time and is very motivated to get going.       Family Dynamics   Good Support System?  Yes   spouse     Barriers   Psychosocial barriers to participate in program  There are no identifiable barriers or psychosocial needs.;The patient should benefit from training in stress management and relaxation.      Screening Interventions   Interventions  Encouraged to exercise;Program  counselor consult;Provide feedback about the scores to participant;To provide support and resources with identified psychosocial needs    Expected Outcomes  Short Term goal: Utilizing psychosocial counselor, staff and physician to assist with identification of specific Stressors or current issues interfering with healing process. Setting desired goal for each stressor or current issue identified.;Long Term Goal: Stressors or current issues are controlled or eliminated.;Short Term goal: Identification  and review with participant of any Quality of Life or Depression concerns found by scoring the questionnaire.;Long Term goal: The participant improves quality of Life and PHQ9 Scores as seen by post scores and/or verbalization of changes       Quality of Life Scores:  Quality of Life - 01/04/19 1355      Quality of Life   Select  Quality of Life      Quality of Life Scores   Health/Function Pre  25.9 %    Socioeconomic Pre  30 %    Psych/Spiritual Pre  30 %    Family Pre  30 %    GLOBAL Pre  28.14 %      Scores of 19 and below usually indicate a poorer quality of life in these areas.  A difference of  2-3 points is a clinically meaningful difference.  A difference of 2-3 points in the total score of the Quality of Life Index has been associated with significant improvement in overall quality of life, self-image, physical symptoms, and general health in studies assessing change in quality of life.  PHQ-9: Recent Review Flowsheet Data    Depression screen [facility] 2/9 01/04/2019   Decreased Interest 0   Down, Depressed, Hopeless 0   PHQ - 2 Score 0   Altered sleeping 0   Tired, decreased energy 1   Change in appetite 1   Feeling bad or failure about yourself  0   Trouble concentrating 0   Moving slowly or fidgety/restless 0   PHQ-9 Score 2   Difficult doing work/chores Not difficult at all     Interpretation of Total Score  Total Score Depression Severity:  1-4 = Minimal depression, 5-9 =  Mild depression, 10-14 = Moderate depression, 15-19 = Moderately severe depression, 20-27 = Severe depression   Psychosocial Evaluation and Intervention: Psychosocial Evaluation - 01/08/19 0910      Psychosocial Evaluation & Interventions   Interventions  Encouraged to exercise with the program and follow exercise prescription;Stress management education    Comments  Counselor met with Kathy Wood) today for initial psychosocial evaluation.  She is 69 year old who had a aortic valve replacement and pacemaker inserted (3) weeks ago.  She has a strong support system with a spouse of 23 years; (3) adult children and active involvement in her local church.  Kathy Wood is cancer free currently after having breast cancer twice and a mastectomy of her left breast.  She also struggles with hypothyroidism and takes medication for that.  She reports sleeping well and her appetite has not returned yet since the surgery. She denies a history of depression or anxiety or any current symptoms and is typically in a positive mood most of the time.  Her primary stressors are her health and being the caregiver for her 29 year old mother in law who lives in [place] assisted living.  Kathy Wood has goals to increase her energy and recognize her exercise limits while in this program.  She will be followed by staff.      Expected Outcomes  Short:  Kathy Wood will exercise consistently to re-gain her energy levels and determine her limits in her exercise regimen.  She will also exercise for stress reduction.  Long:  Kathy Wood will develop a routine of exercise for her health and to manage her stress positively.     Continue Psychosocial Services   Follow up required by staff       Psychosocial Re-Evaluation:   Psychosocial  Discharge (Final Psychosocial Re-Evaluation):   Vocational Rehabilitation: Provide vocational rehab assistance to qualifying candidates.   Vocational Rehab Evaluation & Intervention: Vocational Rehab -  01/04/19 1350      Initial Vocational Rehab Evaluation & Intervention   Assessment shows need for Vocational Rehabilitation  No       Education: Education Goals: Education classes will be provided on a variety of topics geared toward better understanding of heart health and risk factor modification. Participant will state understanding/return demonstration of topics presented as noted by education test scores.  Learning Barriers/Preferences: Learning Barriers/Preferences - 01/04/19 1350      Learning Barriers/Preferences   Learning Barriers  None    Learning Preferences  None       Education Topics:  AED/CPR: - Group verbal and written instruction with the use of models to demonstrate the basic use of the AED with the basic ABC's of resuscitation.   Cardiac Rehab from 01/08/2019 in [facility] Cardiac and Pulmonary Rehab  Date  01/08/19  Educator  SB  Instruction Review Code  1- Verbalizes Understanding      General Nutrition Guidelines/Fats and Fiber: -Group instruction provided by verbal, written material, models and posters to present the general guidelines for heart healthy nutrition. Gives an explanation and review of dietary fats and fiber.   Controlling Sodium/Reading Food Labels: -Group verbal and written material supporting the discussion of sodium use in heart healthy nutrition. Review and explanation with models, verbal and written materials for utilization of the food label.   Exercise Physiology & General Exercise Guidelines: - Group verbal and written instruction with models to review the exercise physiology of the cardiovascular system and associated critical values. Provides general exercise guidelines with specific guidelines to those with heart or lung disease.    Aerobic Exercise & Resistance Training: - Gives group verbal and written instruction on the various components of exercise. Focuses on aerobic and resistive training programs and the benefits of this  training and how to safely progress through these programs..   Flexibility, Balance, Mind/Body Relaxation: Provides group verbal/written instruction on the benefits of flexibility and balance training, including mind/body exercise modes such as yoga, pilates and tai chi.  Demonstration and skill practice provided.   Stress and Anxiety: - Provides group verbal and written instruction about the health risks of elevated stress and causes of high stress.  Discuss the correlation between heart/lung disease and anxiety and treatment options. Review healthy ways to manage with stress and anxiety.   Depression: - Provides group verbal and written instruction on the correlation between heart/lung disease and depressed mood, treatment options, and the stigmas associated with seeking treatment.   Anatomy & Physiology of the Heart: - Group verbal and written instruction and models provide basic cardiac anatomy and physiology, with the coronary electrical and arterial systems. Review of Valvular disease and Heart Failure   Cardiac Procedures: - Group verbal and written instruction to review commonly prescribed medications for heart disease. Reviews the medication, class of the drug, and side effects. Includes the steps to properly store meds and maintain the prescription regimen. (beta blockers and nitrates)   Cardiac Medications I: - Group verbal and written instruction to review commonly prescribed medications for heart disease. Reviews the medication, class of the drug, and side effects. Includes the steps to properly store meds and maintain the prescription regimen.   Cardiac Medications II: -Group verbal and written instruction to review commonly prescribed medications for heart disease. Reviews the medication, class of the drug, and  side effects. (all other drug classes)    Go Sex-Intimacy & Heart Disease, Get SMART - Goal Setting: - Group verbal and written instruction through game format  to discuss heart disease and the return to sexual intimacy. Provides group verbal and written material to discuss and apply goal setting through the application of the S.M.A.R.T. Method.   Other Matters of the Heart: - Provides group verbal, written materials and models to describe Stable Angina and Peripheral Artery. Includes description of the disease process and treatment options available to the cardiac patient.   Exercise & Equipment Safety: - Individual verbal instruction and demonstration of equipment use and safety with use of the equipment.   Cardiac Rehab from 01/08/2019 in [facility] Cardiac and Pulmonary Rehab  Date  01/04/19  Educator  [facility]  Instruction Review Code  1- Verbalizes Understanding      Infection Prevention: - Provides verbal and written material to individual with discussion of infection control including proper hand washing and proper equipment cleaning during exercise session.   Cardiac Rehab from 01/08/2019 in [facility] Cardiac and Pulmonary Rehab  Date  01/04/19  Educator  [facility]  Instruction Review Code  1- Verbalizes Understanding      Falls Prevention: - Provides verbal and written material to individual with discussion of falls prevention and safety.   Cardiac Rehab from 01/08/2019 in [facility] Cardiac and Pulmonary Rehab  Date  01/04/19  Educator  [facility]  Instruction Review Code  1- Verbalizes Understanding      Diabetes: - Individual verbal and written instruction to review signs/symptoms of diabetes, desired ranges of glucose level fasting, after meals and with exercise. Acknowledge that pre and post exercise glucose checks will be done for 3 sessions at entry of program.   Know Your Numbers and Risk Factors: -Group verbal and written instruction about important numbers in your health.  Discussion of what are risk factors and how they play a role in the disease process.  Review of Cholesterol, Blood Pressure, Diabetes, and BMI and the role they play in your overall  health.   Sleep Hygiene: -Provides group verbal and written instruction about how sleep can affect your health.  Define sleep hygiene, discuss sleep cycles and impact of sleep habits. Review good sleep hygiene tips.    Other: -Provides group and verbal instruction on various topics (see comments)   Knowledge Questionnaire Score: Knowledge Questionnaire Score - 01/04/19 1350      Knowledge Questionnaire Score   Pre Score  26/26       Core Components/Risk Factors/Patient Goals at Admission: Personal Goals and Risk Factors at Admission - 01/04/19 1349      Core Components/Risk Factors/Patient Goals on Admission    Weight Management  Yes;Weight Loss    Intervention  Weight Management: Develop a combined nutrition and exercise program designed to reach desired caloric intake, while maintaining appropriate intake of nutrient and fiber, sodium and fats, and appropriate energy expenditure required for the weight goal.;Weight Management: Provide education and appropriate resources to help participant work on and attain dietary goals.;Weight Management/Obesity: Establish reasonable short term and long term weight goals.    Admit Weight  164 lb 6.4 oz (74.6 kg)   wants to gain muscle back as well as lose weight   Expected Outcomes  Short Term: Continue to assess and modify interventions until short term weight is achieved;Long Term: Adherence to nutrition and physical activity/exercise program aimed toward attainment of established weight goal;Weight Loss: Understanding of general recommendations for a balanced deficit  meal plan, which promotes 1-2 lb weight loss per week and includes a negative energy balance of [phone] kcal/d;Understanding recommendations for meals to include 15-35% energy as protein, 25-35% energy from fat, 35-60% energy from carbohydrates, less than 244m of dietary cholesterol, 20-35 gm of total fiber daily;Understanding of distribution of calorie intake throughout the day with  the consumption of 4-5 meals/snacks    Lipids  Yes    Intervention  Provide education and support for participant on nutrition & aerobic/resistive exercise along with prescribed medications to achieve LDL <729m HDL >4012m   Expected Outcomes  Short Term: Participant states understanding of desired cholesterol values and is compliant with medications prescribed. Participant is following exercise prescription and nutrition guidelines.;Long Term: Cholesterol controlled with medications as prescribed, with individualized exercise RX and with personalized nutrition plan. Value goals: LDL < 31m7mDL > 40 mg.       Core Components/Risk Factors/Patient Goals Review:    Core Components/Risk Factors/Patient Goals at Discharge (Final Review):    ITP Comments: ITP Comments    Row Name 01/04/19 1337 01/10/19 0550         ITP Comments  Med Review completed. Initial ITP created. Diagnosis can be found in [facility]  30 day review. Continue with ITP unless directed changes by Medical Director chart review.         Comments:

## 2019-01-12 ENCOUNTER — Encounter: Payer: Medicare HMO | Admitting: *Deleted

## 2019-01-12 DIAGNOSIS — Z952 Presence of prosthetic heart valve: Secondary | ICD-10-CM | POA: Diagnosis not present

## 2019-01-12 NOTE — Progress Notes (Signed)
Daily Session Note  Patient Details  Name: Kathy Wood MRN: [id number] Date of Birth: Jul 22, 1951 Referring Provider:     Cardiac Rehab from 01/04/2019 in [facility] Cardiac and Pulmonary Rehab  Referring Provider  Hande      Encounter Date: 01/12/2019  Check In: Session Check In - 01/12/19 0842      Check-In   Supervising physician immediately available to respond to emergencies  See telemetry face sheet for immediately available ER MD    Location  [facility]-Cardiac & Pulmonary Rehab    Staff Present  Renita Papa, RN BSN;Jessica Luan Pulling, MA, RCEP, CCRP, Exercise Physiologist;Krista Frederico Hamman, RN BSN    Medication changes reported      No    Fall or balance concerns reported     No    Tobacco Cessation  No Change    Warm-up and Cool-down  Performed as group-led Higher education careers adviser Performed  Yes    VAD Patient?  No      Pain Assessment   Currently in Pain?  No/denies          Social History   Tobacco Use  Smoking Status Never Smoker  Smokeless Tobacco Never Used    Goals Met:  Independence with exercise equipment Using PLB without cueing & demonstrates good technique Exercise tolerated well Queuing for purse lip breathing No report of cardiac concerns or symptoms Strength training completed today  Goals Unmet:  Not Applicable  Comments: Pt able to follow exercise prescription today without complaint. Some SOB on the Treadmill but better with PLB.  Will continue to monitor for progression.    Dr. Emily Filbert is Medical Director for [facility] and [facility].

## 2019-01-15 ENCOUNTER — Encounter: Payer: Medicare HMO | Attending: Internal Medicine | Admitting: *Deleted

## 2019-01-15 DIAGNOSIS — Z79899 Other long term (current) drug therapy: Secondary | ICD-10-CM | POA: Diagnosis not present

## 2019-01-15 DIAGNOSIS — Z923 Personal history of irradiation: Secondary | ICD-10-CM | POA: Insufficient documentation

## 2019-01-15 DIAGNOSIS — Z853 Personal history of malignant neoplasm of breast: Secondary | ICD-10-CM | POA: Insufficient documentation

## 2019-01-15 DIAGNOSIS — Z7982 Long term (current) use of aspirin: Secondary | ICD-10-CM | POA: Diagnosis not present

## 2019-01-15 DIAGNOSIS — Z9221 Personal history of antineoplastic chemotherapy: Secondary | ICD-10-CM | POA: Insufficient documentation

## 2019-01-15 DIAGNOSIS — E039 Hypothyroidism, unspecified: Secondary | ICD-10-CM | POA: Insufficient documentation

## 2019-01-15 DIAGNOSIS — Z952 Presence of prosthetic heart valve: Secondary | ICD-10-CM

## 2019-01-15 DIAGNOSIS — Z79890 Hormone replacement therapy (postmenopausal): Secondary | ICD-10-CM | POA: Insufficient documentation

## 2019-01-15 NOTE — Progress Notes (Signed)
Daily Session Note  Patient Details  Name: Kathy Wood MRN: [id number] Date of Birth: 01/13/1951 Referring Provider:     Cardiac Rehab from 01/04/2019 in [facility] Cardiac and Pulmonary Rehab  Referring Provider  Hande      Encounter Date: 01/15/2019  Check In: Session Check In - 01/15/19 0749      Check-In   Supervising physician immediately available to respond to emergencies  See telemetry face sheet for immediately available ER MD    Location  [facility]-Cardiac & Pulmonary Rehab    Staff Present  Kelly Hayes, BS, ACSM CEP, Exercise Physiologist;Jessica Hawkins, MA, RCEP, CCRP, Exercise Physiologist;Susanne Bice, RN, BSN, CCRP    Medication changes reported      No    Fall or balance concerns reported     No    Tobacco Cessation  No Change    Warm-up and Cool-down  Performed as group-led instruction    Resistance Training Performed  Yes    VAD Patient?  No    PAD/SET Patient?  No      Pain Assessment   Currently in Pain?  No/denies    Multiple Pain Sites  No          Social History   Tobacco Use  Smoking Status Never Smoker  Smokeless Tobacco Never Used    Goals Met:  Independence with exercise equipment Exercise tolerated well No report of cardiac concerns or symptoms Strength training completed today  Goals Unmet:  Not Applicable  Comments: Pt able to follow exercise prescription today without complaint.  Will continue to monitor for progression.    Dr. Mark Miller is Medical Director for [facility] and [facility]. 

## 2019-01-17 DIAGNOSIS — Z952 Presence of prosthetic heart valve: Secondary | ICD-10-CM

## 2019-01-17 NOTE — Progress Notes (Signed)
Daily Session Note  Patient Details  Name: Kathy Wood MRN: [id number] Date of Birth: 25-Aug-1951 Referring Provider:     Cardiac Rehab from 01/04/2019 in [facility] Cardiac and Pulmonary Rehab  Referring Provider  Hande      Encounter Date: 01/17/2019  Check In: Session Check In - 01/17/19 0741      Check-In   Supervising physician immediately available to respond to emergencies  See telemetry face sheet for immediately available ER MD    Location  [facility]-Cardiac & Pulmonary Rehab    Staff Present  Alberteen Sam, MA, RCEP, CCRP, Exercise Physiologist;Landis Dowdy [place];Heath Lark, RN, BSN, CCRP    Medication changes reported      No    Fall or balance concerns reported     No    Warm-up and Cool-down  Performed as group-led instruction    Resistance Training Performed  Yes    VAD Patient?  No    PAD/SET Patient?  No      Pain Assessment   Currently in Pain?  No/denies          Social History   Tobacco Use  Smoking Status Never Smoker  Smokeless Tobacco Never Used    Goals Met:  Independence with exercise equipment Exercise tolerated well No report of cardiac concerns or symptoms Strength training completed today  Goals Unmet:  Not Applicable  Comments: Pt able to follow exercise prescription today without complaint.  Will continue to monitor for progression.  Reviewed home exercise with pt today.  Pt plans to walk and use machines for exercise.  She is also a member of the [employer].  Reviewed THR, pulse, RPE, sign and symptoms, NTG use, and when to call 911 or MD.  Also discussed weather considerations and indoor options.  Pt voiced understanding.    Dr. Emily Filbert is Medical Director for [place] and [facility].

## 2019-01-19 ENCOUNTER — Encounter: Payer: Medicare HMO | Admitting: *Deleted

## 2019-01-19 DIAGNOSIS — Z952 Presence of prosthetic heart valve: Secondary | ICD-10-CM

## 2019-01-19 NOTE — Progress Notes (Signed)
Daily Session Note  Patient Details  Name: LAQUAN LUDDEN MRN: [id number] Date of Birth: 25-Nov-1950 Referring Provider:     Cardiac Rehab from 01/04/2019 in [facility] Cardiac and Pulmonary Rehab  Referring Provider  Hande      Encounter Date: 01/19/2019  Check In: Session Check In - 01/19/19 0744      Check-In   Supervising physician immediately available to respond to emergencies  See telemetry face sheet for immediately available ER MD    Location  [facility]-Cardiac & Pulmonary Rehab    Staff Present  Alberteen Sam, MA, RCEP, CCRP, Exercise Physiologist;Meredith Sherryll Burger, RN Vickki Hearing, BA, ACSM CEP, Exercise Physiologist    Medication changes reported      No    Fall or balance concerns reported     No    Warm-up and Cool-down  Performed as group-led instruction    Resistance Training Performed  Yes    VAD Patient?  No    PAD/SET Patient?  No      Pain Assessment   Currently in Pain?  No/denies          Social History   Tobacco Use  Smoking Status Never Smoker  Smokeless Tobacco Never Used    Goals Met:  Independence with exercise equipment Exercise tolerated well Personal goals reviewed No report of cardiac concerns or symptoms Strength training completed today  Goals Unmet:  Not Applicable  Comments: Pt able to follow exercise prescription today without complaint.  Will continue to monitor for progression.    Dr. Emily Filbert is Medical Director for [place] and [facility].

## 2019-01-30 ENCOUNTER — Encounter: Payer: Self-pay | Admitting: *Deleted

## 2019-01-30 DIAGNOSIS — Z952 Presence of prosthetic heart valve: Secondary | ICD-10-CM

## 2019-02-07 ENCOUNTER — Encounter: Payer: Self-pay | Admitting: *Deleted

## 2019-02-07 DIAGNOSIS — Z952 Presence of prosthetic heart valve: Secondary | ICD-10-CM

## 2019-02-07 NOTE — Progress Notes (Signed)
Cardiac Individual Treatment Plan  Patient Details  Name: Kathy Wood MRN: [id number] Date of Birth: 1951/05/15 Referring Provider:     Cardiac Rehab from 01/04/2019 in [facility] Cardiac and Pulmonary Rehab  Referring Provider  Hande      Initial Encounter Date:    Cardiac Rehab from 01/04/2019 in [facility] Cardiac and Pulmonary Rehab  Date  01/04/19      Visit Diagnosis: S/P AVR (aortic valve replacement)  Patient's Home Medications on Admission:  Current Outpatient Medications:  .  alendronate (FOSAMAX) 70 MG tablet, Take 70 mg by mouth once a week., Disp: , Rfl:  .  aspirin 81 MG chewable tablet, Chew by mouth., Disp: , Rfl:  .  calcium carbonate (OS-CAL) 600 MG TABS tablet, Take 600 mg by mouth daily., Disp: , Rfl:  .  Cholecalciferol (VITAMIN D3) 25 MCG (1000 UT) CAPS, Take by mouth., Disp: , Rfl:  .  levothyroxine (SYNTHROID, LEVOTHROID) 75 MCG tablet, , Disp: , Rfl:  .  Multiple Vitamin (MULTIVITAMIN) tablet, Take 1 tablet by mouth daily., Disp: , Rfl:  .  pravastatin (PRAVACHOL) 40 MG tablet, Take 40 mg by mouth daily., Disp: , Rfl:   Past Medical History: Past Medical History:  Diagnosis Date  . 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 Cancer of left breast ([place]) 2009  . Hypothyroidism   . Personal history of chemotherapy 2009  . Personal history of radiation therapy 2006    Tobacco Use: Social History   Tobacco Use  Smoking Status Never Smoker  Smokeless Tobacco Never Used    Labs: Recent Review Flowsheet Data    There is no flowsheet data to display.       Exercise Target Goals: Exercise Program Goal: Individual exercise prescription set using results from initial 6 min walk test and THRR while considering  patient's activity barriers and safety.   Exercise Prescription Goal: Initial exercise prescription builds to 30-45 minutes a day of aerobic activity, 2-3 days per week.  Home exercise  guidelines will be given to patient during program as part of exercise prescription that the participant will acknowledge.  Activity Barriers & Risk Stratification: Activity Barriers & Cardiac Risk Stratification - 01/04/19 1358      Activity Barriers & Cardiac Risk Stratification   Activity Barriers  None    Cardiac Risk Stratification  Low       6 Minute Walk: 6 Minute Walk    Row Name 01/04/19 1423         6 Minute Walk   Phase  Initial     Distance  1400 feet     Walk Time  6 minutes     # of Rest Breaks  0     MPH  2.65     METS  3.6     RPE  11     Perceived Dyspnea   1     VO2 Peak  12.6     Symptoms  No     Resting HR  118 bpm     Resting BP  126/82     Exercise Oxygen Saturation  during 6 min walk  98 %     Max Ex. HR  128 bpm     Max Ex. BP  136/78     2 Minute Post BP  124/76        Oxygen Initial Assessment:   Oxygen Re-Evaluation:  Oxygen Discharge (Final Oxygen Re-Evaluation):   Initial Exercise Prescription: Initial Exercise Prescription - 01/04/19 1400      Date of Initial Exercise RX and Referring Provider   Date  01/04/19    Referring Provider  Hande      Treadmill   MPH  2.6    Grade  1    Minutes  15    METs  3.35      Recumbant Bike   Level  4    RPM  60    Watts  38    Minutes  15    METs  3.6      Elliptical   Level  1    Speed  3    Minutes  15      REL-XR   Level  4    Speed  50    Minutes  15    METs  3.6      Prescription Details   Frequency (times per week)  3    Duration  Progress to 45 minutes of aerobic exercise without signs/symptoms of physical distress      Intensity   THRR 40-80% of Max Heartrate  132-148    Ratings of Perceived Exertion  11-15    Perceived Dyspnea  0-4      Progression   Progression  Continue to progress workloads to maintain intensity without signs/symptoms of physical distress.      Resistance Training   Training Prescription  Yes    Weight  3 lb   no overhead yet    Reps  10-15       Perform Capillary Blood Glucose checks as needed.  Exercise Prescription Changes: Exercise Prescription Changes    Row Name 01/04/19 1400 01/16/19 1600 01/17/19 0900         Response to Exercise   Blood Pressure (Admit)  126/82  124/70  -     Blood Pressure (Exercise)  136/78  156/72  -     Blood Pressure (Exit)  124/76  102/62  -     Heart Rate (Admit)  118 bpm  114 bpm  -     Heart Rate (Exercise)  128 bpm  130 bpm  -     Heart Rate (Exit)  123 bpm  86 bpm  -     Oxygen Saturation (Exit)  98 %  -  -     Rating of Perceived Exertion (Exercise)  11  17  -     Symptoms  -  none  -     Duration  -  Continue with 45 min of aerobic exercise without signs/symptoms of physical distress.  -     Intensity  -  THRR unchanged  -       Progression   Progression  -  Continue to progress workloads to maintain intensity without signs/symptoms of physical distress.  -     Average METs  -  3.48  -       Resistance Training   Training Prescription  -  Yes  -     Weight  -  3 lbs  -     Reps  -  10-15  -       Interval Training   Interval Training  -  No  -       Treadmill   MPH  -  2.6  -     Grade  -  1  -  Minutes  -  15  -     METs  -  3.35  -       Recumbant Bike   Level  -  3  -     Minutes  -  15  -     METs  -  3.6  -       Elliptical   Level  -  1  -     Speed  -  3  -     Minutes  -  15  -       Home Exercise Plan   Plans to continue exercise at  -  -  Home (comment) walking, country club     Frequency  -  -  Add 2 additional days to program exercise sessions.     Initial Home Exercises Provided  -  -  01/17/19        Exercise Comments: Exercise Comments    Row Name 01/08/19 7619 01/17/19 0908         Exercise Comments   First full day of exercise!  Patient was oriented to gym and equipment including functions, settings, policies, and procedures.  Patient's individual exercise prescription and treatment plan were reviewed.  All starting  workloads were established based on the results of the 6 minute walk test done at initial orientation visit.  The plan for exercise progression was also introduced and progression will be customized based on patient's performance and goals.  Kathy Wood will be going on vacation for three weeks starting next week.  When she returns, she may only stay for a week before changing to [employer] due to her insurance copay.          Exercise Goals and Review: Exercise Goals    Row Name 01/04/19 1422             Exercise Goals   Increase Physical Activity  Yes       Intervention  Provide advice, education, support and counseling about physical activity/exercise needs.;Develop an individualized exercise prescription for aerobic and resistive training based on initial evaluation findings, risk stratification, comorbidities and participant's personal goals.       Expected Outcomes  Short Term: Attend rehab on a regular basis to increase amount of physical activity.;Long Term: Add in home exercise to make exercise part of routine and to increase amount of physical activity.;Long Term: Exercising regularly at least 3-5 days a week.       Increase Strength and Stamina  Yes       Intervention  Provide advice, education, support and counseling about physical activity/exercise needs.;Develop an individualized exercise prescription for aerobic and resistive training based on initial evaluation findings, risk stratification, comorbidities and participant's personal goals.       Expected Outcomes  Short Term: Increase workloads from initial exercise prescription for resistance, speed, and METs.;Short Term: Perform resistance training exercises routinely during rehab and add in resistance training at home;Long Term: Improve cardiorespiratory fitness, muscular endurance and strength as measured by increased METs and functional capacity (6MWT)       Able to understand and use rate of perceived exertion (RPE) scale  Yes        Intervention  Provide education and explanation on how to use RPE scale       Expected Outcomes  Short Term: Able to use RPE daily in rehab to express subjective intensity level;Long Term:  Able to use RPE to guide intensity level when exercising  independently       Knowledge and understanding of Target Heart Rate Range (THRR)  Yes       Intervention  Provide education and explanation of THRR including how the numbers were predicted and where they are located for reference       Expected Outcomes  Short Term: Able to state/look up THRR;Short Term: Able to use daily as guideline for intensity in rehab;Long Term: Able to use THRR to govern intensity when exercising independently       Able to check pulse independently  Yes       Intervention  Provide education and demonstration on how to check pulse in carotid and radial arteries.;Review the importance of being able to check your own pulse for safety during independent exercise       Expected Outcomes  Short Term: Able to explain why pulse checking is important during independent exercise;Long Term: Able to check pulse independently and accurately       Understanding of Exercise Prescription  Yes       Intervention  Provide education, explanation, and written materials on patient's individual exercise prescription       Expected Outcomes  Short Term: Able to explain program exercise prescription;Long Term: Able to explain home exercise prescription to exercise independently          Exercise Goals Re-Evaluation : Exercise Goals Re-Evaluation    Row Name 01/08/19 0752 01/16/19 1604 01/17/19 0908 01/19/19 1012 01/30/19 1417     Exercise Goal Re-Evaluation   Exercise Goals Review  Increase Physical Activity;Increase Strength and Stamina;Able to understand and use rate of perceived exertion (RPE) scale;Knowledge and understanding of Target Heart Rate Range (THRR);Understanding of Exercise Prescription  Increase Physical Activity;Increase Strength and  Stamina;Understanding of Exercise Prescription  Increase Physical Activity;Increase Strength and Stamina;Understanding of Exercise Prescription;Able to understand and use rate of perceived exertion (RPE) scale;Knowledge and understanding of Target Heart Rate Range (THRR);Able to check pulse independently  Increase Physical Activity;Increase Strength and Stamina;Understanding of Exercise Prescription  -   Comments  Reviewed RPE scale, THR and program prescription with pt today.  Pt voiced understanding and was given a copy of goals to take home.   Kathy Wood is off to a good start in rehab.  She is already up to 3.6 METs on the recumbent bike.  We will continue to monitor her progress.   Reviewed home exercise with pt today.  Pt plans to walk and use machines for exercise.  She is also a member of the [employer].  Reviewed THR, pulse, RPE, sign and symptoms, NTG use, and when to call 911 or MD.  Also discussed weather considerations and indoor options.  Pt voiced understanding.  Kathy Wood has been doing well with rehab.  She feels better about where she is now as she was terrified to exercise before coming into program after her event.  Now she is already much more confident and ready to get back to her routine.  She will be walking while out on vacation and using some machines at the hotel.   She feels that her stamina is back and still climbing.   Out on vacation since last review   Expected Outcomes  Short: Use RPE daily to regulate intensity. Long: Follow program prescription in THR.  Short: Continue to attend regularly  Long: Continue to improve strength and stamina.   Short: Walk while out on vacation.  Long: Continue to improve strength and stamina.   Short: Walk while out on  vacation.  Long: Continue to improve strength and stamina.   -      Discharge Exercise Prescription (Final Exercise Prescription Changes): Exercise Prescription Changes - 01/17/19 0900      Home Exercise Plan   Plans to continue  exercise at  Home (comment)   walking, country club   Frequency  Add 2 additional days to program exercise sessions.    Initial Home Exercises Provided  01/17/19       Nutrition:  Target Goals: Understanding of nutrition guidelines, daily intake of sodium '1500mg'$ , cholesterol '200mg'$ , calories 30% from fat and 7% or less from saturated fats, daily to have 5 or more servings of fruits and vegetables.  Biometrics: Pre Biometrics - 01/04/19 1422      Pre Biometrics   Height  5' 4.5" (1.638 m)    Weight  164 lb 6.4 oz (74.6 kg)    Waist Circumference  35.5 inches    Hip Circumference  41 inches    Waist to Hip Ratio  0.87 %    BMI (Calculated)  27.79    Single Leg Stand  30 seconds        Nutrition Therapy Plan and Nutrition Goals: Nutrition Therapy & Goals - 02/07/19 1154      Nutrition Therapy   Diet  Healthy heart, low sodium 1500-1600kcal (BMI 27.79)(wst: 35.5 inches)    Protein (specify units)  65-75g    Fiber  25 grams    Whole Grain Foods  3 servings    Saturated Fats  12 max. grams    Fruits and Vegetables  5 servings/day    Sodium  1.5 grams      Personal Nutrition Goals   Nutrition Goal  ST: increase exercise with current health crisis (COVID 19). LT: continue with HH diet    Comments  Pt reports gaining her appetite back, but her baseline is now lower than before the surgery; discussed her energy needs for normal functioning. B: yogurt w/ egg + black tea (mentioned iron and black tea - to just be mindful). L half soup or half sandwhich. D: lean meat with vegetable. Pt reports losing weight, discussed healthy weight loss and calroie needs as well as muscle mass.       Intervention Plan   Intervention  Prescribe, educate and counsel regarding individualized specific dietary modifications aiming towards targeted core components such as weight, hypertension, lipid management, diabetes, heart failure and other comorbidities.    Expected Outcomes  Short Term Goal:  Understand basic principles of dietary content, such as calories, fat, sodium, cholesterol and nutrients.;Short Term Goal: A plan has been developed with personal nutrition goals set during dietitian appointment.;Long Term Goal: Adherence to prescribed nutrition plan.       Nutrition Assessments: Nutrition Assessments - 01/04/19 1355      MEDFICTS Scores   Pre Score  31       Nutrition Goals Re-Evaluation: Nutrition Goals Re-Evaluation    Row Name 01/19/19 1020             Goals   Nutrition Goal  Heart Healthy, Increase fluids, watch sodium        Comment  Kathy Wood has been doing well with her diet.  Her appetite is still not back to where she wants it to be yet.  However, her weight is coming down and she is trying to make good choices.  She is getting in fruits and vegetables.  She is also trying to watch her sodium intake.  She has cut out sugared beverages and drinks mostly water.  One thing she has noted that is weird for her is that she is no longer craving sweets and especially not chocolate when that used to be one of her favorites.        Expected Outcome  Short: Continue to eat healthier and gain appetite.  Long: Continue to follow heart healthy diet.           Nutrition Goals Discharge (Final Nutrition Goals Re-Evaluation): Nutrition Goals Re-Evaluation - 01/19/19 1020      Goals   Nutrition Goal  Heart Healthy, Increase fluids, watch sodium     Comment  Kathy Wood has been doing well with her diet.  Her appetite is still not back to where she wants it to be yet.  However, her weight is coming down and she is trying to make good choices.  She is getting in fruits and vegetables.  She is also trying to watch her sodium intake.  She has cut out sugared beverages and drinks mostly water.  One thing she has noted that is weird for her is that she is no longer craving sweets and especially not chocolate when that used to be one of her favorites.     Expected Outcome  Short: Continue  to eat healthier and gain appetite.  Long: Continue to follow heart healthy diet.        Psychosocial: Target Goals: Acknowledge presence or absence of significant depression and/or stress, maximize coping skills, provide positive support system. Participant is able to verbalize types and ability to use techniques and skills needed for reducing stress and depression.   Initial Review & Psychosocial Screening: Initial Psych Review & Screening - 01/04/19 1350      Initial Review   Current issues with  Current Stress Concerns    Source of Stress Concerns  Unable to participate in former interests or hobbies    Comments  Debbie's shortness of breath has become an issue after her valve replacement. She has lived a very active lifestyle and is looking to get back to that. She knows it will take time and is very motivated to get going.       Family Dynamics   Good Support System?  Yes   spouse     Barriers   Psychosocial barriers to participate in program  There are no identifiable barriers or psychosocial needs.;The patient should benefit from training in stress management and relaxation.      Screening Interventions   Interventions  Encouraged to exercise;Program counselor consult;Provide feedback about the scores to participant;To provide support and resources with identified psychosocial needs    Expected Outcomes  Short Term goal: Utilizing psychosocial counselor, staff and physician to assist with identification of specific Stressors or current issues interfering with healing process. Setting desired goal for each stressor or current issue identified.;Long Term Goal: Stressors or current issues are controlled or eliminated.;Short Term goal: Identification and review with participant of any Quality of Life or Depression concerns found by scoring the questionnaire.;Long Term goal: The participant improves quality of Life and PHQ9 Scores as seen by post scores and/or verbalization of changes        Quality of Life Scores:  Quality of Life - 01/04/19 1355      Quality of Life   Select  Quality of Life      Quality of Life Scores   Health/Function Pre  25.9 %    Socioeconomic Pre  30 %  Psych/Spiritual Pre  30 %    Family Pre  30 %    GLOBAL Pre  28.14 %      Scores of 19 and below usually indicate a poorer quality of life in these areas.  A difference of  2-3 points is a clinically meaningful difference.  A difference of 2-3 points in the total score of the Quality of Life Index has been associated with significant improvement in overall quality of life, self-image, physical symptoms, and general health in studies assessing change in quality of life.  PHQ-9: Recent Review Flowsheet Data    Depression screen [facility] 2/9 01/04/2019   Decreased Interest 0   Down, Depressed, Hopeless 0   PHQ - 2 Score 0   Altered sleeping 0   Tired, decreased energy 1   Change in appetite 1   Feeling bad or failure about yourself  0   Trouble concentrating 0   Moving slowly or fidgety/restless 0   PHQ-9 Score 2   Difficult doing work/chores Not difficult at all     Interpretation of Total Score  Total Score Depression Severity:  1-4 = Minimal depression, 5-9 = Mild depression, 10-14 = Moderate depression, 15-19 = Moderately severe depression, 20-27 = Severe depression   Psychosocial Evaluation and Intervention: Psychosocial Evaluation - 01/08/19 0910      Psychosocial Evaluation & Interventions   Interventions  Encouraged to exercise with the program and follow exercise prescription;Stress management education    Comments  Counselor met with Ms. Ivancic Kathy Wood) today for initial psychosocial evaluation.  She is 68 year old who had a aortic valve replacement and pacemaker inserted (3) weeks ago.  She has a strong support system with a spouse of 36 years; (3) adult children and active involvement in her local church.  Kathy Wood is cancer free currently after having breast cancer twice and a  mastectomy of her left breast.  She also struggles with hypothyroidism and takes medication for that.  She reports sleeping well and her appetite has not returned yet since the surgery. She denies a history of depression or anxiety or any current symptoms and is typically in a positive mood most of the time.  Her primary stressors are her health and being the caregiver for her 51 year old mother in law who lives in [place] assisted living.  Kathy Wood has goals to increase her energy and recognize her exercise limits while in this program.  She will be followed by staff.      Expected Outcomes  Short:  Kathy Wood will exercise consistently to re-gain her energy levels and determine her limits in her exercise regimen.  She will also exercise for stress reduction.  Long:  Kathy Wood will develop a routine of exercise for her health and to manage her stress positively.     Continue Psychosocial Services   Follow up required by staff       Psychosocial Re-Evaluation: Psychosocial Re-Evaluation    [place] Name 01/19/19 1017             Psychosocial Re-Evaluation   Current issues with  Current Stress Concerns       Comments  Kathy Wood is doing well mentally. Her major stressors continue to be her health and her caregiver responsibilites.  In generally, things are going well and improving.  She has more confidence with exercise now.  She is looking forward to her vacation!!  When she gets back, she may only do the program for another week before switching to  [employer] mode due to insurance copays.       Expected Outcomes  Short: Enjoy vacation.  Long: Continue to stay positive!       Interventions  Encouraged to attend Cardiac Rehabilitation for the exercise;Stress management education       Continue Psychosocial Services   Follow up required by staff          Psychosocial Discharge (Final Psychosocial Re-Evaluation): Psychosocial Re-Evaluation - 01/19/19 1017      Psychosocial Re-Evaluation   Current issues  with  Current Stress Concerns    Comments  Kathy Wood is doing well mentally. Her major stressors continue to be her health and her caregiver responsibilites.  In generally, things are going well and improving.  She has more confidence with exercise now.  She is looking forward to her vacation!!  When she gets back, she may only do the program for another week before switching to [employer] mode due to insurance copays.    Expected Outcomes  Short: Enjoy vacation.  Long: Continue to stay positive!    Interventions  Encouraged to attend Cardiac Rehabilitation for the exercise;Stress management education    Continue Psychosocial Services   Follow up required by staff       Vocational Rehabilitation: Provide vocational rehab assistance to qualifying candidates.   Vocational Rehab Evaluation & Intervention: Vocational Rehab - 01/04/19 1350      Initial Vocational Rehab Evaluation & Intervention   Assessment shows need for Vocational Rehabilitation  No       Education: Education Goals: Education classes will be provided on a variety of topics geared toward better understanding of heart health and risk factor modification. Participant will state understanding/return demonstration of topics presented as noted by education test scores.  Learning Barriers/Preferences: Learning Barriers/Preferences - 01/04/19 1350      Learning Barriers/Preferences   Learning Barriers  None    Learning Preferences  None       Education Topics:  AED/CPR: - Group verbal and written instruction with the use of models to demonstrate the basic use of the AED with the basic ABC's of resuscitation.   Cardiac Rehab from 01/17/2019 in [facility] Cardiac and Pulmonary Rehab  Date  01/08/19  Educator  SB  Instruction Review Code  1- Verbalizes Understanding      General Nutrition Guidelines/Fats and Fiber: -Group instruction provided by verbal, written material, models and posters to present the general guidelines for  heart healthy nutrition. Gives an explanation and review of dietary fats and fiber.   Controlling Sodium/Reading Food Labels: -Group verbal and written material supporting the discussion of sodium use in heart healthy nutrition. Review and explanation with models, verbal and written materials for utilization of the food label.   Exercise Physiology & General Exercise Guidelines: - Group verbal and written instruction with models to review the exercise physiology of the cardiovascular system and associated critical values. Provides general exercise guidelines with specific guidelines to those with heart or lung disease.    Aerobic Exercise & Resistance Training: - Gives group verbal and written instruction on the various components of exercise. Focuses on aerobic and resistive training programs and the benefits of this training and how to safely progress through these programs..   Flexibility, Balance, Mind/Body Relaxation: Provides group verbal/written instruction on the benefits of flexibility and balance training, including mind/body exercise modes such as yoga, pilates and tai chi.  Demonstration and skill practice provided.   Stress and Anxiety: - Provides group verbal and written instruction about the  health risks of elevated stress and causes of high stress.  Discuss the correlation between heart/lung disease and anxiety and treatment options. Review healthy ways to manage with stress and anxiety.   Depression: - Provides group verbal and written instruction on the correlation between heart/lung disease and depressed mood, treatment options, and the stigmas associated with seeking treatment.   Cardiac Rehab from 01/17/2019 in [facility] Cardiac and Pulmonary Rehab  Date  01/17/19  Educator  KG  Instruction Review Code  1- Verbalizes Understanding      Anatomy & Physiology of the Heart: - Group verbal and written instruction and models provide basic cardiac anatomy and physiology, with  the coronary electrical and arterial systems. Review of Valvular disease and Heart Failure   Cardiac Procedures: - Group verbal and written instruction to review commonly prescribed medications for heart disease. Reviews the medication, class of the drug, and side effects. Includes the steps to properly store meds and maintain the prescription regimen. (beta blockers and nitrates)   Cardiac Medications I: - Group verbal and written instruction to review commonly prescribed medications for heart disease. Reviews the medication, class of the drug, and side effects. Includes the steps to properly store meds and maintain the prescription regimen.   Cardiac Medications II: -Group verbal and written instruction to review commonly prescribed medications for heart disease. Reviews the medication, class of the drug, and side effects. (all other drug classes)   Cardiac Rehab from 01/17/2019 in [facility] Cardiac and Pulmonary Rehab  Date  01/15/19  Educator  SB  Instruction Review Code  1- Verbalizes Understanding       Go Sex-Intimacy & Heart Disease, Get SMART - Goal Setting: - Group verbal and written instruction through game format to discuss heart disease and the return to sexual intimacy. Provides group verbal and written material to discuss and apply goal setting through the application of the S.M.A.R.T. Method.   Other Matters of the Heart: - Provides group verbal, written materials and models to describe Stable Angina and Peripheral Artery. Includes description of the disease process and treatment options available to the cardiac patient.   Exercise & Equipment Safety: - Individual verbal instruction and demonstration of equipment use and safety with use of the equipment.   Cardiac Rehab from 01/17/2019 in [facility] Cardiac and Pulmonary Rehab  Date  01/04/19  Educator  [facility]  Instruction Review Code  1- Verbalizes Understanding      Infection Prevention: - Provides verbal and written material to  individual with discussion of infection control including proper hand washing and proper equipment cleaning during exercise session.   Cardiac Rehab from 01/17/2019 in [facility] Cardiac and Pulmonary Rehab  Date  01/04/19  Educator  [facility]  Instruction Review Code  1- Verbalizes Understanding      Falls Prevention: - Provides verbal and written material to individual with discussion of falls prevention and safety.   Cardiac Rehab from 01/17/2019 in [facility] Cardiac and Pulmonary Rehab  Date  01/04/19  Educator  [facility]  Instruction Review Code  1- Verbalizes Understanding      Diabetes: - Individual verbal and written instruction to review signs/symptoms of diabetes, desired ranges of glucose level fasting, after meals and with exercise. Acknowledge that pre and post exercise glucose checks will be done for 3 sessions at entry of program.   Know Your Numbers and Risk Factors: -Group verbal and written instruction about important numbers in your health.  Discussion of what are risk factors and how they play a role  in the disease process.  Review of Cholesterol, Blood Pressure, Diabetes, and BMI and the role they play in your overall health.   Cardiac Rehab from 01/17/2019 in [facility] Cardiac and Pulmonary Rehab  Date  01/15/19  Educator  SB  Instruction Review Code  1- Verbalizes Understanding      Sleep Hygiene: -Provides group verbal and written instruction about how sleep can affect your health.  Define sleep hygiene, discuss sleep cycles and impact of sleep habits. Review good sleep hygiene tips.    Other: -Provides group and verbal instruction on various topics (see comments)   Knowledge Questionnaire Score: Knowledge Questionnaire Score - 01/04/19 1350      Knowledge Questionnaire Score   Pre Score  26/26       Core Components/Risk Factors/Patient Goals at Admission: Personal Goals and Risk Factors at Admission - 01/04/19 1349      Core Components/Risk Factors/Patient Goals on Admission     Weight Management  Yes;Weight Loss    Intervention  Weight Management: Develop a combined nutrition and exercise program designed to reach desired caloric intake, while maintaining appropriate intake of nutrient and fiber, sodium and fats, and appropriate energy expenditure required for the weight goal.;Weight Management: Provide education and appropriate resources to help participant work on and attain dietary goals.;Weight Management/Obesity: Establish reasonable short term and long term weight goals.    Admit Weight  164 lb 6.4 oz (74.6 kg)   wants to gain muscle back as well as lose weight   Expected Outcomes  Short Term: Continue to assess and modify interventions until short term weight is achieved;Long Term: Adherence to nutrition and physical activity/exercise program aimed toward attainment of established weight goal;Weight Loss: Understanding of general recommendations for a balanced deficit meal plan, which promotes 1-2 lb weight loss per week and includes a negative energy balance of [phone] kcal/d;Understanding recommendations for meals to include 15-35% energy as protein, 25-35% energy from fat, 35-60% energy from carbohydrates, less than '200mg'$  of dietary cholesterol, 20-35 gm of total fiber daily;Understanding of distribution of calorie intake throughout the day with the consumption of 4-5 meals/snacks    Lipids  Yes    Intervention  Provide education and support for participant on nutrition & aerobic/resistive exercise along with prescribed medications to achieve LDL '70mg'$ , HDL >'40mg'$ .    Expected Outcomes  Short Term: Participant states understanding of desired cholesterol values and is compliant with medications prescribed. Participant is following exercise prescription and nutrition guidelines.;Long Term: Cholesterol controlled with medications as prescribed, with individualized exercise RX and with personalized nutrition plan. Value goals: LDL < '70mg'$ , HDL > 40 mg.       Core  Components/Risk Factors/Patient Goals Review:  Goals and Risk Factor Review    Row Name 01/19/19 1023             Core Components/Risk Factors/Patient Goals Review   Personal Goals Review  Weight Management/Obesity;Lipids;Hypertension       Review  Kathy Wood is doing well in rehab. Her weight is starting to come down.  Her pressures have been good in class and she even bought a blood pressure to take with her on vacation and to start using at home.  She is feeling good with her medications as well.         Expected Outcomes  Short: Continue to work on weight loss.  Long: Continue to monitor her risk factors.           Core Components/Risk Factors/Patient Goals at Discharge (Final Review):  Goals and Risk Factor Review - 01/19/19 1023      Core Components/Risk Factors/Patient Goals Review   Personal Goals Review  Weight Management/Obesity;Lipids;Hypertension    Review  Kathy Wood is doing well in rehab. Her weight is starting to come down.  Her pressures have been good in class and she even bought a blood pressure to take with her on vacation and to start using at home.  She is feeling good with her medications as well.      Expected Outcomes  Short: Continue to work on weight loss.  Long: Continue to monitor her risk factors.        ITP Comments: ITP Comments    Row Name 01/04/19 1337 01/10/19 0550 01/19/19 1012 01/30/19 1416 02/07/19 1206   ITP Comments  Med Review completed. Initial ITP created. Diagnosis can be found in [facility] 1/28  30 day review. Continue with ITP unless directed changes by Medical Director chart review.  Kathy Wood will be out of town on vacation for the next three weeks.    Our program is currently closed due to COVID-19.  We are communicating with patient via phone calls and emails.    30 day review. Continue with ITP unless directed changes by Medical Director chart review.      Comments:

## 2019-02-12 ENCOUNTER — Encounter: Payer: Self-pay | Admitting: *Deleted

## 2019-02-12 DIAGNOSIS — Z952 Presence of prosthetic heart valve: Secondary | ICD-10-CM

## 2019-03-01 ENCOUNTER — Encounter: Payer: Self-pay | Admitting: *Deleted

## 2019-03-01 DIAGNOSIS — Z952 Presence of prosthetic heart valve: Secondary | ICD-10-CM

## 2019-03-06 ENCOUNTER — Encounter: Admission: RE | Payer: Self-pay | Source: Ambulatory Visit

## 2019-03-06 ENCOUNTER — Ambulatory Visit: Admission: RE | Admit: 2019-03-06 | Payer: Medicare HMO | Source: Ambulatory Visit | Admitting: Ophthalmology

## 2019-03-06 SURGERY — PHACOEMULSIFICATION, CATARACT, WITH IOL INSERTION
Anesthesia: Choice | Laterality: Right

## 2019-03-23 ENCOUNTER — Encounter: Payer: Self-pay | Admitting: *Deleted

## 2019-03-23 DIAGNOSIS — Z952 Presence of prosthetic heart valve: Secondary | ICD-10-CM

## 2019-04-13 DIAGNOSIS — Z952 Presence of prosthetic heart valve: Secondary | ICD-10-CM

## 2019-04-16 ENCOUNTER — Encounter: Payer: Self-pay | Admitting: *Deleted

## 2019-04-16 DIAGNOSIS — Z952 Presence of prosthetic heart valve: Secondary | ICD-10-CM

## 2019-05-09 ENCOUNTER — Encounter: Payer: Self-pay | Admitting: *Deleted

## 2019-05-09 DIAGNOSIS — Z952 Presence of prosthetic heart valve: Secondary | ICD-10-CM

## 2019-05-12 ENCOUNTER — Telehealth: Payer: Self-pay | Admitting: *Deleted

## 2019-05-12 NOTE — Telephone Encounter (Signed)
Opened in error

## 2019-05-16 ENCOUNTER — Other Ambulatory Visit: Payer: Self-pay

## 2019-05-16 ENCOUNTER — Encounter: Payer: Self-pay | Admitting: *Deleted

## 2019-05-16 NOTE — Anesthesia Preprocedure Evaluation (Addendum)
Anesthesia Evaluation  Patient identified by MRN, date of birth, ID band Patient awake    Reviewed: Allergy & Precautions, NPO status , Patient's Chart, lab work & pertinent test results  Airway Mallampati: II  TM Distance: >3 FB Neck ROM: Full    Dental no notable dental hx.    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      Musculoskeletal negative musculoskeletal ROS (+)   Abdominal   Peds  Hematology   Anesthesia Other Findings Seen by PCP 6/16 Cardiac clearance on chart - plan had been to wait 6 months post valve placement and pacemaker. Last cardiology visit 04/26/19.  Reproductive/Obstetrics                            Anesthesia Physical Anesthesia Plan  ASA: III  Anesthesia Plan: MAC   Post-op Pain Management:    Induction: Intravenous  PONV Risk Score and Plan: TIVA  Airway Management Planned:   Additional Equipment:   Intra-op Plan:   Post-operative Plan: Extubation in OR  Informed Consent: I have reviewed the patients History and Physical, chart, labs and discussed the procedure including the risks, benefits and alternatives for the proposed anesthesia with the patient or authorized representative who has indicated his/her understanding and acceptance.     Dental advisory given  Plan Discussed with: CRNA  Anesthesia Plan Comments:         Anesthesia Quick Evaluation

## 2019-05-18 ENCOUNTER — Other Ambulatory Visit: Payer: Self-pay

## 2019-05-18 ENCOUNTER — Other Ambulatory Visit
Admission: RE | Admit: 2019-05-18 | Discharge: 2019-05-18 | Disposition: A | Discharge status: Home / Self Care | Payer: Medicare HMO | Source: Ambulatory Visit | Attending: Ophthalmology | Admitting: Ophthalmology

## 2019-05-18 ENCOUNTER — Other Ambulatory Visit: Admission: RE | Admit: 2019-05-18 | Payer: Medicare HMO | Source: Ambulatory Visit

## 2019-05-18 DIAGNOSIS — Z1159 Encounter for screening for other viral diseases: Secondary | ICD-10-CM | POA: Insufficient documentation

## 2019-05-18 DIAGNOSIS — Z01812 Encounter for preprocedural laboratory examination: Secondary | ICD-10-CM | POA: Insufficient documentation

## 2019-05-19 LAB — SARS CORONAVIRUS 2 (TAT 6-24 HRS): SARS Coronavirus 2: NEGATIVE

## 2019-05-21 NOTE — Discharge Instructions (Signed)

## 2019-05-22 ENCOUNTER — Ambulatory Visit: Payer: Medicare HMO | Admitting: Anesthesiology

## 2019-05-22 ENCOUNTER — Encounter
Admission: RE | Disposition: A | Discharge status: Home / Self Care | Payer: Self-pay | Source: Ambulatory Visit | Attending: Ophthalmology

## 2019-05-22 ENCOUNTER — Ambulatory Visit
Admission: RE | Admit: 2019-05-22 | Discharge: 2019-05-22 | Disposition: A | Discharge status: Home / Self Care | Payer: Medicare HMO | Source: Ambulatory Visit | Attending: Ophthalmology | Admitting: Ophthalmology

## 2019-05-22 DIAGNOSIS — Z950 Presence of cardiac pacemaker: Secondary | ICD-10-CM | POA: Insufficient documentation

## 2019-05-22 DIAGNOSIS — Z7982 Long term (current) use of aspirin: Secondary | ICD-10-CM | POA: Insufficient documentation

## 2019-05-22 DIAGNOSIS — Z888 Allergy status to other drugs, medicaments and biological substances status: Secondary | ICD-10-CM | POA: Diagnosis not present

## 2019-05-22 DIAGNOSIS — Z79899 Other long term (current) drug therapy: Secondary | ICD-10-CM | POA: Diagnosis not present

## 2019-05-22 DIAGNOSIS — E039 Hypothyroidism, unspecified: Secondary | ICD-10-CM | POA: Diagnosis not present

## 2019-05-22 DIAGNOSIS — H2512 Age-related nuclear cataract, left eye: Secondary | ICD-10-CM | POA: Insufficient documentation

## 2019-05-22 DIAGNOSIS — E7800 Pure hypercholesterolemia, unspecified: Secondary | ICD-10-CM | POA: Diagnosis not present

## 2019-05-22 DIAGNOSIS — I442 Atrioventricular block, complete: Secondary | ICD-10-CM | POA: Diagnosis not present

## 2019-05-22 DIAGNOSIS — Z952 Presence of prosthetic heart valve: Secondary | ICD-10-CM | POA: Diagnosis not present

## 2019-05-22 HISTORY — DX: Age-related osteoporosis without current pathological fracture: M81.0

## 2019-05-22 HISTORY — PX: CATARACT EXTRACTION W/PHACO: SHX586

## 2019-05-22 HISTORY — DX: Nonrheumatic aortic (valve) stenosis: I35.0

## 2019-05-22 HISTORY — DX: Hyperlipidemia, unspecified: E78.5

## 2019-05-22 SURGERY — PHACOEMULSIFICATION, CATARACT, WITH IOL INSERTION
Anesthesia: Monitor Anesthesia Care | Site: Eye | Laterality: Left

## 2019-05-22 MED ORDER — TETRACAINE HCL 0.5 % OP SOLN
1.0000 [drp] | OPHTHALMIC | Status: DC | PRN
  Administered 2019-05-22 (×3): 1 [drp] via OPHTHALMIC

## 2019-05-22 MED ORDER — MOXIFLOXACIN HCL 0.5 % OP SOLN
OPHTHALMIC | Status: DC | PRN
  Administered 2019-05-22: 0.2 mL via OPHTHALMIC

## 2019-05-22 MED ORDER — LIDOCAINE HCL (PF) 2 % IJ SOLN
INTRAOCULAR | Status: DC | PRN
  Administered 2019-05-22: 2 mL

## 2019-05-22 MED ORDER — ARMC OPHTHALMIC DILATING DROPS
1.0000 "application " | OPHTHALMIC | Status: DC | PRN
  Administered 2019-05-22 (×3): 1 via OPHTHALMIC

## 2019-05-22 MED ORDER — OXYCODONE HCL 5 MG PO TABS: 5.0000 mg | ORAL_TABLET | Freq: Once | ORAL | Status: DC | PRN

## 2019-05-22 MED ORDER — FENTANYL CITRATE (PF) 100 MCG/2ML IJ SOLN
25.0000 ug | INTRAMUSCULAR | Status: DC | PRN
  Administered 2019-05-22: 11:00:00 50 ug via INTRAVENOUS

## 2019-05-22 MED ORDER — BRIMONIDINE TARTRATE-TIMOLOL 0.2-0.5 % OP SOLN
OPHTHALMIC | Status: DC | PRN
  Administered 2019-05-22: 1 [drp] via OPHTHALMIC

## 2019-05-22 MED ORDER — NA CHONDROIT SULF-NA HYALURON 40-17 MG/ML IO SOLN
INTRAOCULAR | Status: DC | PRN
  Administered 2019-05-22: 1 mL via INTRAOCULAR

## 2019-05-22 MED ORDER — EPINEPHRINE PF 1 MG/ML IJ SOLN
INTRAOCULAR | Status: DC | PRN
  Administered 2019-05-22: 11:00:00 60 mL via OPHTHALMIC

## 2019-05-22 MED ORDER — MIDAZOLAM HCL 2 MG/2ML IJ SOLN
INTRAMUSCULAR | Status: DC | PRN
  Administered 2019-05-22: 1 mg via INTRAVENOUS

## 2019-05-22 MED ORDER — OXYCODONE HCL 5 MG/5ML PO SOLN: 5.0000 mg | Freq: Once | ORAL | Status: DC | PRN

## 2019-05-22 MED ORDER — LACTATED RINGERS IV SOLN: INTRAVENOUS | Status: DC

## 2019-05-22 SURGICAL SUPPLY — 19 items
CANNULA ANT/CHMB 27G (MISCELLANEOUS) ×1 IMPLANT
CANNULA ANT/CHMB 27GA (MISCELLANEOUS) ×3 IMPLANT
GLOVE SURG LX 8.0 MICRO (GLOVE) ×2
GLOVE SURG LX STRL 8.0 MICRO (GLOVE) ×1 IMPLANT
GLOVE SURG TRIUMPH 8.0 PF LTX (GLOVE) ×3 IMPLANT
GOWN STRL REUS W/ TWL LRG LVL3 (GOWN DISPOSABLE) ×2 IMPLANT
GOWN STRL REUS W/TWL LRG LVL3 (GOWN DISPOSABLE) ×6
LENS IOL TECNIS ITEC 7.0 (Intraocular Lens) ×2 IMPLANT
MARKER SKIN DUAL TIP RULER LAB (MISCELLANEOUS) ×3 IMPLANT
NDL FILTER BLUNT 18X1 1/2 (NEEDLE) ×1 IMPLANT
NDL RETROBULBAR .5 NSTRL (NEEDLE) ×3 IMPLANT
NEEDLE FILTER BLUNT 18X 1/2SAF (NEEDLE) ×2
NEEDLE FILTER BLUNT 18X1 1/2 (NEEDLE) ×1 IMPLANT
PACK EYE AFTER SURG (MISCELLANEOUS) ×3 IMPLANT
PACK OPTHALMIC (MISCELLANEOUS) ×3 IMPLANT
PACK PORFILIO (MISCELLANEOUS) ×3 IMPLANT
SYR 3ML LL SCALE MARK (SYRINGE) ×3 IMPLANT
SYR TB 1ML LUER SLIP (SYRINGE) ×3 IMPLANT
WIPE NON LINTING 3.25X3.25 (MISCELLANEOUS) ×3 IMPLANT

## 2019-05-22 NOTE — Transfer of Care (Signed)
Immediate Anesthesia Transfer of Care Note  Patient: Kathy Wood  Procedure(s) Performed: CATARACT EXTRACTION PHACO AND INTRAOCULAR LENS PLACEMENT (IOC) left (Left Eye)  Patient Location: PACU  Anesthesia Type: MAC  Level of Consciousness: awake, alert  and patient cooperative  Airway and Oxygen Therapy: Patient Spontanous Breathing and Patient connected to supplemental oxygen  Post-op Assessment: Post-op Vital signs reviewed, Patient's Cardiovascular Status Stable, Respiratory Function Stable, Patent Airway and No signs of Nausea or vomiting  Post-op Vital Signs: Reviewed and stable  Complications: No apparent anesthesia complications

## 2019-05-22 NOTE — Op Note (Signed)
PREOPERATIVE DIAGNOSIS:  Nuclear sclerotic cataract of the left eye.   POSTOPERATIVE DIAGNOSIS:  Nuclear sclerotic cataract of the left eye.   OPERATIVE PROCEDURE: Procedure(s): CATARACT EXTRACTION PHACO AND INTRAOCULAR LENS PLACEMENT (IOC) left   SURGEON:  Birder Robson, MD.   ANESTHESIA:  Anesthesiologist: Shyrl Numbers, MD CRNA: Georga Bora, CRNA  1.      Managed anesthesia care. 2.     0.83ml of Shugarcaine was instilled following the paracentesis   COMPLICATIONS:  None.   TECHNIQUE:   Stop and chop   DESCRIPTION OF PROCEDURE:  The patient was examined and consented in the preoperative holding area where the aforementioned topical anesthesia was applied to the left eye and then brought back to the Operating Room where the left eye was prepped and draped in the usual sterile ophthalmic fashion and a lid speculum was placed. A paracentesis was created with the side port blade and the anterior chamber was filled with viscoelastic. A near clear corneal incision was performed with the steel keratome. A continuous curvilinear capsulorrhexis was performed with a cystotome followed by the capsulorrhexis forceps. Hydrodissection and hydrodelineation were carried out with BSS on a blunt cannula. The lens was removed in a stop and chop  technique and the remaining cortical material was removed with the irrigation-aspiration handpiece. The capsular bag was inflated with viscoelastic and the Technis [id number] lens was placed in the capsular bag without complication. The remaining viscoelastic was removed from the eye with the irrigation-aspiration handpiece. The wounds were hydrated. The anterior chamber was flushed with BSS and the eye was inflated to physiologic pressure. 0.40ml Vigamox was placed in the anterior chamber. The wounds were found to be water tight. The eye was dressed with Combigan. The patient was given protective glasses to wear throughout the day and a shield with which to  sleep tonight. The patient was also given drops with which to begin a drop regimen today and will follow-up with me in one day. Implant Name Type Inv. Item Serial No. Manufacturer Lot No. LRB No. Used Action  LENS IOL DIOP 07.0 - [id number] Intraocular Lens LENS IOL DIOP 07.0 [id number] AMO  Left 1 Implanted    Procedure(s): CATARACT EXTRACTION PHACO AND INTRAOCULAR LENS PLACEMENT (IOC) left (Left)  Electronically signed: Birder Robson 05/22/2019 11:01 AM

## 2019-05-22 NOTE — Anesthesia Postprocedure Evaluation (Signed)
Anesthesia Post Note  Patient: Kathy Wood  Procedure(s) Performed: CATARACT EXTRACTION PHACO AND INTRAOCULAR LENS PLACEMENT ([place]) left (Left Eye)  Patient location during evaluation: PACU Anesthesia Type: MAC Level of consciousness: awake and alert Pain management: pain level controlled Vital Signs Assessment: post-procedure vital signs reviewed and stable Respiratory status: spontaneous breathing, nonlabored ventilation, respiratory function stable and patient connected to nasal cannula oxygen Cardiovascular status: stable and blood pressure returned to baseline Postop Assessment: no apparent nausea or vomiting Anesthetic complications: no    Garima Chronis C

## 2019-05-22 NOTE — H&P (Signed)
All labs reviewed. Abnormal studies sent to patients PCP when indicated.  Previous H&P reviewed, patient examined, there are NO CHANGES.  Kathy Brass Porfilio7/7/202010:37 AM

## 2019-05-23 ENCOUNTER — Encounter: Payer: Self-pay | Admitting: Ophthalmology

## 2019-05-30 ENCOUNTER — Encounter: Payer: Self-pay | Admitting: *Deleted

## 2019-05-30 DIAGNOSIS — Z952 Presence of prosthetic heart valve: Secondary | ICD-10-CM

## 2019-05-30 NOTE — Progress Notes (Signed)
Cardiac Individual Treatment Plan  Patient Details  Name: Kathy Wood MRN: [id number] Date of Birth: [birth year] Referring Provider:     Cardiac Rehab from 01/04/2019 in [facility] Cardiac and Pulmonary Rehab  Referring Provider  Hande      Initial Encounter Date:    Cardiac Rehab from 01/04/2019 in [facility] Cardiac and Pulmonary Rehab  Date  01/04/19      Visit Diagnosis: S/P AVR (aortic valve replacement)  Patient's Home Medications on Admission:  Current Outpatient Medications:  .  alendronate (FOSAMAX) 70 MG tablet, Take 70 mg by mouth once a week., Disp: , Rfl:  .  aspirin 81 MG chewable tablet, Chew by mouth., Disp: , Rfl:  .  calcium carbonate (OS-CAL) 600 MG TABS tablet, Take 600 mg by mouth daily., Disp: , Rfl:  .  Cholecalciferol (VITAMIN D3) 25 MCG (1000 UT) CAPS, Take by mouth., Disp: , Rfl:  .  levothyroxine (SYNTHROID, LEVOTHROID) 75 MCG tablet, , Disp: , Rfl:  .  metoprolol tartrate (LOPRESSOR) 25 MG tablet, Take 12.5 mg by mouth 2 (two) times daily., Disp: , Rfl:  .  Multiple Vitamin (MULTIVITAMIN) tablet, Take 1 tablet by mouth daily., Disp: , Rfl:  .  pravastatin (PRAVACHOL) 40 MG tablet, Take 40 mg by mouth daily., Disp: , Rfl:   Past Medical History: Past Medical History:  Diagnosis Date  . Aortic valvar stenosis    Valve replacement. [facility]. 12/13/18  . 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 Cancer of left breast ([place]) 2009  . Hyperlipidemia   . Hypothyroidism   . Osteoporosis   . Personal history of chemotherapy 2009  . Personal history of radiation therapy 2006  . 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 Medtronic Azure Xt Dr Mcarthur Rossetti. [id number]    Tobacco Use: Social History   Tobacco Use  Smoking Status Never Smoker  Smokeless Tobacco Never Used    Labs: Recent Review Flowsheet Data    There is no flowsheet data to display.       Exercise Target  Goals: Exercise Program Goal: Individual exercise prescription set using results from initial 6 min walk test and THRR while considering  patient's activity barriers and safety.   Exercise Prescription Goal: Initial exercise prescription builds to 30-45 minutes a day of aerobic activity, 2-3 days per week.  Home exercise guidelines will be given to patient during program as part of exercise prescription that the participant will acknowledge.  Activity Barriers & Risk Stratification: Activity Barriers & Cardiac Risk Stratification - 01/04/19 1358      Activity Barriers & Cardiac Risk Stratification   Activity Barriers  None    Cardiac Risk Stratification  Low       6 Minute Walk: 6 Minute Walk    Row Name 01/04/19 1423         6 Minute Walk   Phase  Initial     Distance  1400 feet     Walk Time  6 minutes     # of Rest Breaks  0     MPH  2.65     METS  3.6     RPE  11     Perceived Dyspnea   1     VO2 Peak  12.6     Symptoms  No     Resting HR  118 bpm     Resting BP  126/82     Exercise Oxygen Saturation  during 6 min walk  98 %     Max Ex. HR  128 bpm     Max Ex. BP  136/78     2 Minute Post BP  124/76        Oxygen Initial Assessment:   Oxygen Re-Evaluation:   Oxygen Discharge (Final Oxygen Re-Evaluation):   Initial Exercise Prescription: Initial Exercise Prescription - 01/04/19 1400      Date of Initial Exercise RX and Referring Provider   Date  01/04/19    Referring Provider  Hande      Treadmill   MPH  2.6    Grade  1    Minutes  15    METs  3.35      Recumbant Bike   Level  4    RPM  60    Watts  38    Minutes  15    METs  3.6      Elliptical   Level  1    Speed  3    Minutes  15      REL-XR   Level  4    Speed  50    Minutes  15    METs  3.6      Prescription Details   Frequency (times per week)  3    Duration  Progress to 45 minutes of aerobic exercise without signs/symptoms of physical distress      Intensity   THRR  40-80% of Max Heartrate  132-148    Ratings of Perceived Exertion  11-15    Perceived Dyspnea  0-4      Progression   Progression  Continue to progress workloads to maintain intensity without signs/symptoms of physical distress.      Resistance Training   Training Prescription  Yes    Weight  3 lb   no overhead yet   Reps  10-15       Perform Capillary Blood Glucose checks as needed.  Exercise Prescription Changes: Exercise Prescription Changes    Row Name 01/04/19 1400 01/16/19 1600 01/17/19 0900         Response to Exercise   Blood Pressure (Admit)  126/82  124/70  -     Blood Pressure (Exercise)  136/78  156/72  -     Blood Pressure (Exit)  124/76  102/62  -     Heart Rate (Admit)  118 bpm  114 bpm  -     Heart Rate (Exercise)  128 bpm  130 bpm  -     Heart Rate (Exit)  123 bpm  86 bpm  -     Oxygen Saturation (Exit)  98 %  -  -     Rating of Perceived Exertion (Exercise)  11  17  -     Symptoms  -  none  -     Duration  -  Continue with 45 min of aerobic exercise without signs/symptoms of physical distress.  -     Intensity  -  THRR unchanged  -       Progression   Progression  -  Continue to progress workloads to maintain intensity without signs/symptoms of physical distress.  -     Average METs  -  3.48  -       Resistance Training   Training Prescription  -  Yes  -     Weight  -  3 lbs  -  Reps  -  10-15  -       Interval Training   Interval Training  -  No  -       Treadmill   MPH  -  2.6  -     Grade  -  1  -     Minutes  -  15  -     METs  -  3.35  -       Recumbant Bike   Level  -  3  -     Minutes  -  15  -     METs  -  3.6  -       Elliptical   Level  -  1  -     Speed  -  3  -     Minutes  -  15  -       Home Exercise Plan   Plans to continue exercise at  -  -  Home (comment) walking, country club     Frequency  -  -  Add 2 additional days to program exercise sessions.     Initial Home Exercises Provided  -  -  01/17/19         Exercise Comments: Exercise Comments    Row Name 01/08/19 9381 01/17/19 0908         Exercise Comments   First full day of exercise!  Patient was oriented to gym and equipment including functions, settings, policies, and procedures.  Patient's individual exercise prescription and treatment plan were reviewed.  All starting workloads were established based on the results of the 6 minute walk test done at initial orientation visit.  The plan for exercise progression was also introduced and progression will be customized based on patient's performance and goals.  Kathy Wood will be going on vacation for three weeks starting next week.  When she returns, she may only stay for a week before changing to [employer] due to her insurance copay.          Exercise Goals and Review: Exercise Goals    Row Name 01/04/19 1422             Exercise Goals   Increase Physical Activity  Yes       Intervention  Provide advice, education, support and counseling about physical activity/exercise needs.;Develop an individualized exercise prescription for aerobic and resistive training based on initial evaluation findings, risk stratification, comorbidities and participant's personal goals.       Expected Outcomes  Short Term: Attend rehab on a regular basis to increase amount of physical activity.;Long Term: Add in home exercise to make exercise part of routine and to increase amount of physical activity.;Long Term: Exercising regularly at least 3-5 days a week.       Increase Strength and Stamina  Yes       Intervention  Provide advice, education, support and counseling about physical activity/exercise needs.;Develop an individualized exercise prescription for aerobic and resistive training based on initial evaluation findings, risk stratification, comorbidities and participant's personal goals.       Expected Outcomes  Short Term: Increase workloads from initial exercise prescription for resistance, speed, and  METs.;Short Term: Perform resistance training exercises routinely during rehab and add in resistance training at home;Long Term: Improve cardiorespiratory fitness, muscular endurance and strength as measured by increased METs and functional capacity (6MWT)       Able to understand and use rate of perceived exertion (RPE)  scale  Yes       Intervention  Provide education and explanation on how to use RPE scale       Expected Outcomes  Short Term: Able to use RPE daily in rehab to express subjective intensity level;Long Term:  Able to use RPE to guide intensity level when exercising independently       Knowledge and understanding of Target Heart Rate Range (THRR)  Yes       Intervention  Provide education and explanation of THRR including how the numbers were predicted and where they are located for reference       Expected Outcomes  Short Term: Able to state/look up THRR;Short Term: Able to use daily as guideline for intensity in rehab;Long Term: Able to use THRR to govern intensity when exercising independently       Able to check pulse independently  Yes       Intervention  Provide education and demonstration on how to check pulse in carotid and radial arteries.;Review the importance of being able to check your own pulse for safety during independent exercise       Expected Outcomes  Short Term: Able to explain why pulse checking is important during independent exercise;Long Term: Able to check pulse independently and accurately       Understanding of Exercise Prescription  Yes       Intervention  Provide education, explanation, and written materials on patient's individual exercise prescription       Expected Outcomes  Short Term: Able to explain program exercise prescription;Long Term: Able to explain home exercise prescription to exercise independently          Exercise Goals Re-Evaluation : Exercise Goals Re-Evaluation    Row Name 01/08/19 0752 01/16/19 1604 01/17/19 0908 01/19/19 1012 01/30/19  1417     Exercise Goal Re-Evaluation   Exercise Goals Review  Increase Physical Activity;Increase Strength and Stamina;Able to understand and use rate of perceived exertion (RPE) scale;Knowledge and understanding of Target Heart Rate Range (THRR);Understanding of Exercise Prescription  Increase Physical Activity;Increase Strength and Stamina;Understanding of Exercise Prescription  Increase Physical Activity;Increase Strength and Stamina;Understanding of Exercise Prescription;Able to understand and use rate of perceived exertion (RPE) scale;Knowledge and understanding of Target Heart Rate Range (THRR);Able to check pulse independently  Increase Physical Activity;Increase Strength and Stamina;Understanding of Exercise Prescription  -   Comments  Reviewed RPE scale, THR and program prescription with pt today.  Pt voiced understanding and was given a copy of goals to take home.   Kathy Wood is off to a good start in rehab.  She is already up to 3.6 METs on the recumbent bike.  We will continue to monitor her progress.   Reviewed home exercise with pt today.  Pt plans to walk and use machines for exercise.  She is also a member of the [employer].  Reviewed THR, pulse, RPE, sign and symptoms, NTG use, and when to call 911 or MD.  Also discussed weather considerations and indoor options.  Pt voiced understanding.  Kathy Wood has been doing well with rehab.  She feels better about where she is now as she was terrified to exercise before coming into program after her event.  Now she is already much more confident and ready to get back to her routine.  She will be walking while out on vacation and using some machines at the hotel.   She feels that her stamina is back and still climbing.   Out on vacation since last  review   Expected Outcomes  Short: Use RPE daily to regulate intensity. Long: Follow program prescription in THR.  Short: Continue to attend regularly  Long: Continue to improve strength and stamina.   Short: Walk  while out on vacation.  Long: Continue to improve strength and stamina.   Short: Walk while out on vacation.  Long: Continue to improve strength and stamina.   -   Row Name 02/12/19 1131 03/01/19 1033 03/23/19 1010 04/16/19 1040 05/09/19 1134     Exercise Goal Re-Evaluation   Exercise Goals Review  Increase Physical Activity;Increase Strength and Stamina;Understanding of Exercise Prescription  Increase Physical Activity;Increase Strength and Stamina;Understanding of Exercise Prescription  Increase Physical Activity;Increase Strength and Stamina;Understanding of Exercise Prescription  -  Increase Physical Activity;Increase Strength and Stamina;Understanding of Exercise Prescription   Comments  Kathy Wood has been doing well.  She enjoyed her vacation to [place].  She was able to stay active while down there. Since getting back she has been using our videos and stuff from [place] to stay acitve.  She is feeling much better and more confident.  She would like to come back for one day once we get back to finalize everything and then graduation.   Kathy Wood has been doing well at home. She continues to get and walk regularly.  She has also been using the videos from [employer] and from the class [employer] has set up on [place].  She is enjoying all the videos and the antics of [employer] animals.  She has also used our videos for workouts several times.  She is feeling good and still planning to finsih quickly once we are able to return.   Kathy Wood is doing well.  She continues to walk daily and feels that she is getting faster!!  She also continues to use our videos and those from [employer] class.  She is eager to get back to officially graduate!  Kathy Wood has been doing well with exercise.  She continues to walk daily and use videos for variety. She is headed to the beach for the week and looking forward to walking on the beach.   She is planning to down load the new app once she returns.   Kathy Wood is doing well.  She  is at the beach again this week.  She is walking daily and doing some videos.  She would like to return to rehab.  She has several appts coming up that need to be schedule before getting back on our schedule.   Expected Outcomes  Short: Continue to exercise. Long: Continue to increase activity levels.   Short: Continue to use videos for exercise and walk daily.  Long: Continue to rebuild strength and stamina.   Short: Continue walk and use videos.  Long: Continue to build stamina  Short: Continue walk and use videos.  Long: Continue to build stamina  Short: Get rescheduled with [place] to graduate.  Long: Continue to exercise regularly.      Discharge Exercise Prescription (Final Exercise Prescription Changes): Exercise Prescription Changes - 01/17/19 0900      Home Exercise Plan   Plans to continue exercise at  Home (comment)   walking, country club   Frequency  Add 2 additional days to program exercise sessions.    Initial Home Exercises Provided  01/17/19       Nutrition:  Target Goals: Understanding of nutrition guidelines, daily intake of sodium <159m, cholesterol <2052m calories 30% from fat and 7% or less from saturated fats,  daily to have 5 or more servings of fruits and vegetables.  Biometrics: Pre Biometrics - 01/04/19 1422      Pre Biometrics   Height  5' 4.5" (1.638 m)    Weight  164 lb 6.4 oz (74.6 kg)    Waist Circumference  35.5 inches    Hip Circumference  41 inches    Waist to Hip Ratio  0.87 %    BMI (Calculated)  27.79    Single Leg Stand  30 seconds        Nutrition Therapy Plan and Nutrition Goals: Nutrition Therapy & Goals - 02/07/19 1154      Nutrition Therapy   Diet  Healthy heart, low sodium 1500-1600kcal (BMI 27.79)(wst: 35.5 inches)    Protein (specify units)  65-75g    Fiber  25 grams    Whole Grain Foods  3 servings    Saturated Fats  12 max. grams    Fruits and Vegetables  5 servings/day    Sodium  1.5 grams      Personal Nutrition Goals    Nutrition Goal  ST: increase exercise with current health crisis (COVID 19). LT: continue with HH diet    Comments  Pt reports gaining her appetite back, but her baseline is now lower than before the surgery; discussed her energy needs for normal functioning. B: yogurt w/ egg + black tea (mentioned iron and black tea - to just be mindful). L half soup or half sandwhich. D: lean meat with vegetable. Pt reports losing weight, discussed healthy weight loss and calroie needs as well as muscle mass.       Intervention Plan   Intervention  Prescribe, educate and counsel regarding individualized specific dietary modifications aiming towards targeted core components such as weight, hypertension, lipid management, diabetes, heart failure and other comorbidities.    Expected Outcomes  Short Term Goal: Understand basic principles of dietary content, such as calories, fat, sodium, cholesterol and nutrients.;Short Term Goal: A plan has been developed with personal nutrition goals set during dietitian appointment.;Long Term Goal: Adherence to prescribed nutrition plan.       Nutrition Assessments: Nutrition Assessments - 01/04/19 1355      MEDFICTS Scores   Pre Score  31       Nutrition Goals Re-Evaluation: Nutrition Goals Re-Evaluation    [place] Name 01/19/19 1020 04/13/19 1204           Goals   Current Weight  -  157 lb (71.2 kg)      Nutrition Goal  Heart Healthy, Increase fluids, watch sodium   ST/LT: continue [place] eating and exercise, "I'm back to normal, I don't really have anymore goals"      Comment  Kathy Wood has been doing well with her diet.  Her appetite is still not back to where she wants it to be yet.  However, her weight is coming down and she is trying to make good choices.  She is getting in fruits and vegetables.  She is also trying to watch her sodium intake.  She has cut out sugared beverages and drinks mostly water.  One thing she has noted that is weird for her is that she is no longer  craving sweets and especially not chocolate when that used to be one of her favorites.   Appetite is back! B: smoothies in morning, scrambled egg or english muffin or yogurt (will eat more) and fresh fruit. L: half sandwich - turkey/chicken/ham lunch meat, pickles, half orange. Drinks water  throughout the day and some tea in AM. D: piece of meat (chicken breat/thighs, meatballs, etc) and salad      Expected Outcome  Short: Continue to eat healthier and gain appetite.  Long: Continue to follow heart healthy diet.   Continue to follow heart healthy diet.          Nutrition Goals Discharge (Final Nutrition Goals Re-Evaluation): Nutrition Goals Re-Evaluation - 04/13/19 1204      Goals   Current Weight  157 lb (71.2 kg)    Nutrition Goal  ST/LT: continue HH eating and exercise, "I'm back to normal, I don't really have anymore goals"    Comment  Appetite is back! B: smoothies in morning, scrambled egg or english muffin or yogurt (will eat more) and fresh fruit. L: half sandwich - turkey/chicken/ham lunch meat, pickles, half orange. Drinks water throughout the day and some tea in AM. D: piece of meat (chicken breat/thighs, meatballs, etc) and salad    Expected Outcome  Continue to follow heart healthy diet.        Psychosocial: Target Goals: Acknowledge presence or absence of significant depression and/or stress, maximize coping skills, provide positive support system. Participant is able to verbalize types and ability to use techniques and skills needed for reducing stress and depression.   Initial Review & Psychosocial Screening: Initial Psych Review & Screening - 01/04/19 1350      Initial Review   Current issues with  Current Stress Concerns    Source of Stress Concerns  Unable to participate in former interests or hobbies    Comments  Kathy Wood's shortness of breath has become an issue after her valve replacement. She has lived a very active lifestyle and is looking to get back to that. She knows  it will take time and is very motivated to get going.       Family Dynamics   Good Support System?  Yes   spouse     Barriers   Psychosocial barriers to participate in program  There are no identifiable barriers or psychosocial needs.;The patient should benefit from training in stress management and relaxation.      Screening Interventions   Interventions  Encouraged to exercise;Program counselor consult;Provide feedback about the scores to participant;To provide support and resources with identified psychosocial needs    Expected Outcomes  Short Term goal: Utilizing psychosocial counselor, staff and physician to assist with identification of specific Stressors or current issues interfering with healing process. Setting desired goal for each stressor or current issue identified.;Long Term Goal: Stressors or current issues are controlled or eliminated.;Short Term goal: Identification and review with participant of any Quality of Life or Depression concerns found by scoring the questionnaire.;Long Term goal: The participant improves quality of Life and PHQ9 Scores as seen by post scores and/or verbalization of changes       Quality of Life Scores:  Quality of Life - 01/04/19 1355      Quality of Life   Select  Quality of Life      Quality of Life Scores   Health/Function Pre  25.9 %    Socioeconomic Pre  30 %    Psych/Spiritual Pre  30 %    Family Pre  30 %    GLOBAL Pre  28.14 %      Scores of 19 and below usually indicate a poorer quality of life in these areas.  A difference of  2-3 points is a clinically meaningful difference.  A difference of 2-3  points in the total score of the Quality of Life Index has been associated with significant improvement in overall quality of life, self-image, physical symptoms, and general health in studies assessing change in quality of life.  PHQ-9: Recent Review Flowsheet Data    Depression screen [facility] 2/9 01/04/2019   Decreased Interest 0   Down,  Depressed, Hopeless 0   PHQ - 2 Score 0   Altered sleeping 0   Tired, decreased energy 1   Change in appetite 1   Feeling bad or failure about yourself  0   Trouble concentrating 0   Moving slowly or fidgety/restless 0   PHQ-9 Score 2   Difficult doing work/chores Not difficult at all     Interpretation of Total Score  Total Score Depression Severity:  1-4 = Minimal depression, 5-9 = Mild depression, 10-14 = Moderate depression, 15-19 = Moderately severe depression, 20-27 = Severe depression   Psychosocial Evaluation and Intervention: Psychosocial Evaluation - 01/08/19 0910      Psychosocial Evaluation & Interventions   Interventions  Encouraged to exercise with the program and follow exercise prescription;Stress management education    Comments  Counselor met with Ms. Gauna Kathy Wood) today for initial psychosocial evaluation.  She is 68 year old who had a aortic valve replacement and pacemaker inserted (3) weeks ago.  She has a strong support system with a spouse of 43 years; (3) adult children and active involvement in her local church.  Kathy Wood is cancer free currently after having breast cancer twice and a mastectomy of her left breast.  She also struggles with hypothyroidism and takes medication for that.  She reports sleeping well and her appetite has not returned yet since the surgery. She denies a history of depression or anxiety or any current symptoms and is typically in a positive mood most of the time.  Her primary stressors are her health and being the caregiver for her 10 year old mother in law who lives in [place] assisted living.  Kathy Wood has goals to increase her energy and recognize her exercise limits while in this program.  She will be followed by staff.      Expected Outcomes  Short:  Kathy Wood will exercise consistently to re-gain her energy levels and determine her limits in her exercise regimen.  She will also exercise for stress reduction.  Long:  Kathy Wood will develop a  routine of exercise for her health and to manage her stress positively.     Continue Psychosocial Services   Follow up required by staff       Psychosocial Re-Evaluation: Psychosocial Re-Evaluation    [place] Name 01/19/19 1017 02/12/19 1136 03/01/19 1035 03/23/19 1014 04/16/19 1044     Psychosocial Re-Evaluation   Current issues with  Current Stress Concerns  Current Stress Concerns  Current Stress Concerns  Current Stress Concerns  Current Stress Concerns   Comments  Kathy Wood is doing well mentally. Her major stressors continue to be her health and her caregiver responsibilites.  In generally, things are going well and improving.  She has more confidence with exercise now.  She is looking forward to her vacation!!  When she gets back, she may only do the program for another week before switching to [employer] mode due to insurance copays.  Kathy Wood enjoyed her vacation.  She was able to get out to do most things.  She has been tolerating the stresses of the current situation well.  She continues to exercise to help her deal with everything.  Kathy Wood is doing well at home.  She is able to get to walk with a friend daily. She has been using FaceTime and Zoom to connect with friends and family.  She is also using her exercise to help with her stress.  She is having a little difficulty with a recurring UTI, but her doctor called in an antibiotic for her and she is feeling better again.  She mentioned that 70 yo mother in law is having a hard with face masking as she can't read lips anymore so the facetime is a great way for them to both get a good connection.   Kathy Wood is holding up well and staying connected. She is eager to get back to normal.   Kathy Wood is doing well at home.  She continues to stay connected with videos and zoom meetings.  She is headed to the beach for the week and looking forward to a chance to unplug some.   Expected Outcomes  Short: Enjoy vacation.  Long: Continue to stay positive!  Short:  Continue to exercise.  Long: Continue to stay positive!  Short: Continue to exercise for mood boost and connections.  Long: Continue to connect with friends and family virtually.   Short: Continue to connect.  Long: Continue to exercise for moof boost.   Short: Enjoy vacation.  Long: Continue to stay connected and exercise   Interventions  Encouraged to attend Cardiac Rehabilitation for the exercise;Stress management education  -  Encouraged to attend Cardiac Rehabilitation for the exercise  Encouraged to attend Cardiac Rehabilitation for the exercise  -   Continue Psychosocial Services   Follow up required by staff  -  Follow up required by staff  Follow up required by staff  Follow up required by staff   [place] Name 05/09/19 1135             Psychosocial Re-Evaluation   Current issues with  Current Stress Concerns       Comments  Kathy Wood is doing well at home. She is at the beach this week and waiting to hear from doctors to schedule her cataract surgery.  Once she knows her schedule she is eager to return to class to graduate!       Expected Outcomes  Short: Enjoy the beach and schedule surgery.  Long: Continue to stay positive.       Interventions  Encouraged to attend Cardiac Rehabilitation for the exercise       Continue Psychosocial Services   Follow up required by staff          Psychosocial Discharge (Final Psychosocial Re-Evaluation): Psychosocial Re-Evaluation - 05/09/19 1135      Psychosocial Re-Evaluation   Current issues with  Current Stress Concerns    Comments  Kathy Wood is doing well at home. She is at the beach this week and waiting to hear from doctors to schedule her cataract surgery.  Once she knows her schedule she is eager to return to class to graduate!    Expected Outcomes  Short: Enjoy the beach and schedule surgery.  Long: Continue to stay positive.    Interventions  Encouraged to attend Cardiac Rehabilitation for the exercise    Continue Psychosocial Services   Follow  up required by staff       Vocational Rehabilitation: Provide vocational rehab assistance to qualifying candidates.   Vocational Rehab Evaluation & Intervention: Vocational Rehab - 01/04/19 1350      Initial Vocational Rehab Evaluation & Intervention   Assessment shows  need for Vocational Rehabilitation  No       Education: Education Goals: Education classes will be provided on a variety of topics geared toward better understanding of heart health and risk factor modification. Participant will state understanding/return demonstration of topics presented as noted by education test scores.  Learning Barriers/Preferences: Learning Barriers/Preferences - 01/04/19 1350      Learning Barriers/Preferences   Learning Barriers  None    Learning Preferences  None       Education Topics:  AED/CPR: - Group verbal and written instruction with the use of models to demonstrate the basic use of the AED with the basic ABC's of resuscitation.   Cardiac Rehab from 01/17/2019 in [facility] Cardiac and Pulmonary Rehab  Date  01/08/19  Educator  SB  Instruction Review Code  1- Verbalizes Understanding      General Nutrition Guidelines/Fats and Fiber: -Group instruction provided by verbal, written material, models and posters to present the general guidelines for heart healthy nutrition. Gives an explanation and review of dietary fats and fiber.   Controlling Sodium/Reading Food Labels: -Group verbal and written material supporting the discussion of sodium use in heart healthy nutrition. Review and explanation with models, verbal and written materials for utilization of the food label.   Exercise Physiology & General Exercise Guidelines: - Group verbal and written instruction with models to review the exercise physiology of the cardiovascular system and associated critical values. Provides general exercise guidelines with specific guidelines to those with heart or lung disease.    Aerobic Exercise  & Resistance Training: - Gives group verbal and written instruction on the various components of exercise. Focuses on aerobic and resistive training programs and the benefits of this training and how to safely progress through these programs..   Flexibility, Balance, Mind/Body Relaxation: Provides group verbal/written instruction on the benefits of flexibility and balance training, including mind/body exercise modes such as yoga, pilates and tai chi.  Demonstration and skill practice provided.   Stress and Anxiety: - Provides group verbal and written instruction about the health risks of elevated stress and causes of high stress.  Discuss the correlation between heart/lung disease and anxiety and treatment options. Review healthy ways to manage with stress and anxiety.   Depression: - Provides group verbal and written instruction on the correlation between heart/lung disease and depressed mood, treatment options, and the stigmas associated with seeking treatment.   Cardiac Rehab from 01/17/2019 in [facility] Cardiac and Pulmonary Rehab  Date  01/17/19  Educator  KG  Instruction Review Code  1- Verbalizes Understanding      Anatomy & Physiology of the Heart: - Group verbal and written instruction and models provide basic cardiac anatomy and physiology, with the coronary electrical and arterial systems. Review of Valvular disease and Heart Failure   Cardiac Procedures: - Group verbal and written instruction to review commonly prescribed medications for heart disease. Reviews the medication, class of the drug, and side effects. Includes the steps to properly store meds and maintain the prescription regimen. (beta blockers and nitrates)   Cardiac Medications I: - Group verbal and written instruction to review commonly prescribed medications for heart disease. Reviews the medication, class of the drug, and side effects. Includes the steps to properly store meds and maintain the prescription  regimen.   Cardiac Medications II: -Group verbal and written instruction to review commonly prescribed medications for heart disease. Reviews the medication, class of the drug, and side effects. (all other drug classes)   Cardiac Rehab from  01/17/2019 in [facility] Cardiac and Pulmonary Rehab  Date  01/15/19  Educator  SB  Instruction Review Code  1- Verbalizes Understanding       Go Sex-Intimacy & Heart Disease, Get SMART - Goal Setting: - Group verbal and written instruction through game format to discuss heart disease and the return to sexual intimacy. Provides group verbal and written material to discuss and apply goal setting through the application of the S.M.A.R.T. Method.   Other Matters of the Heart: - Provides group verbal, written materials and models to describe Stable Angina and Peripheral Artery. Includes description of the disease process and treatment options available to the cardiac patient.   Exercise & Equipment Safety: - Individual verbal instruction and demonstration of equipment use and safety with use of the equipment.   Cardiac Rehab from 01/17/2019 in [facility] Cardiac and Pulmonary Rehab  Date  01/04/19  Educator  [facility]  Instruction Review Code  1- Verbalizes Understanding      Infection Prevention: - Provides verbal and written material to individual with discussion of infection control including proper hand washing and proper equipment cleaning during exercise session.   Cardiac Rehab from 01/17/2019 in [facility] Cardiac and Pulmonary Rehab  Date  01/04/19  Educator  [facility]  Instruction Review Code  1- Verbalizes Understanding      Falls Prevention: - Provides verbal and written material to individual with discussion of falls prevention and safety.   Cardiac Rehab from 01/17/2019 in [facility] Cardiac and Pulmonary Rehab  Date  01/04/19  Educator  [facility]  Instruction Review Code  1- Verbalizes Understanding      Diabetes: - Individual verbal and written instruction to review  signs/symptoms of diabetes, desired ranges of glucose level fasting, after meals and with exercise. Acknowledge that pre and post exercise glucose checks will be done for 3 sessions at entry of program.   Know Your Numbers and Risk Factors: -Group verbal and written instruction about important numbers in your health.  Discussion of what are risk factors and how they play a role in the disease process.  Review of Cholesterol, Blood Pressure, Diabetes, and BMI and the role they play in your overall health.   Cardiac Rehab from 01/17/2019 in [facility] Cardiac and Pulmonary Rehab  Date  01/15/19  Educator  SB  Instruction Review Code  1- Verbalizes Understanding      Sleep Hygiene: -Provides group verbal and written instruction about how sleep can affect your health.  Define sleep hygiene, discuss sleep cycles and impact of sleep habits. Review good sleep hygiene tips.    Other: -Provides group and verbal instruction on various topics (see comments)   Knowledge Questionnaire Score: Knowledge Questionnaire Score - 01/04/19 1350      Knowledge Questionnaire Score   Pre Score  26/26       Core Components/Risk Factors/Patient Goals at Admission: Personal Goals and Risk Factors at Admission - 01/04/19 1349      Core Components/Risk Factors/Patient Goals on Admission    Weight Management  Yes;Weight Loss    Intervention  Weight Management: Develop a combined nutrition and exercise program designed to reach desired caloric intake, while maintaining appropriate intake of nutrient and fiber, sodium and fats, and appropriate energy expenditure required for the weight goal.;Weight Management: Provide education and appropriate resources to help participant work on and attain dietary goals.;Weight Management/Obesity: Establish reasonable short term and long term weight goals.    Admit Weight  164 lb 6.4 oz (74.6 kg)   wants to gain  muscle back as well as lose weight   Expected Outcomes  Short Term:  Continue to assess and modify interventions until short term weight is achieved;Long Term: Adherence to nutrition and physical activity/exercise program aimed toward attainment of established weight goal;Weight Loss: Understanding of general recommendations for a balanced deficit meal plan, which promotes 1-2 lb weight loss per week and includes a negative energy balance of [phone] kcal/d;Understanding recommendations for meals to include 15-35% energy as protein, 25-35% energy from fat, 35-60% energy from carbohydrates, less than 225m of dietary cholesterol, 20-35 gm of total fiber daily;Understanding of distribution of calorie intake throughout the day with the consumption of 4-5 meals/snacks    Lipids  Yes    Intervention  Provide education and support for participant on nutrition & aerobic/resistive exercise along with prescribed medications to achieve LDL <750m HDL >4034m   Expected Outcomes  Short Term: Participant states understanding of desired cholesterol values and is compliant with medications prescribed. Participant is following exercise prescription and nutrition guidelines.;Long Term: Cholesterol controlled with medications as prescribed, with individualized exercise RX and with personalized nutrition plan. Value goals: LDL < 88m13mDL > 40 mg.       Core Components/Risk Factors/Patient Goals Review:  Goals and Risk Factor Review    Row Name 01/19/19 1023 02/12/19 1138 03/01/19 1040 04/16/19 1046       Core Components/Risk Factors/Patient Goals Review   Personal Goals Review  Weight Management/Obesity;Lipids;Hypertension  Weight Management/Obesity;Lipids;Hypertension  Weight Management/Obesity;Lipids;Hypertension  Weight Management/Obesity;Lipids;Hypertension    Review  DebbJackelyn Polingdoing well in rehab. Her weight is starting to come down.  Her pressures have been good in class and she even bought a blood pressure to take with her on vacation and to start using at home.  She is feeling  good with her medications as well.    DebbJackelyn Wood still been able to lose some weight.  She enjoyed talking with the dietician and is eating better now than before her event.  Her blood pressures have been good, but she thinks that she may need to get a new cuff.   DebbJackelyn Wood been doing well with her weight.  She continues to exercise to help.  She also continues to eat better.  She continues to check her pressures and they have been good.     DebbJackelyn Wood been doing well.  Her weight has been good and staying stable.  She continues to have good blood pressures readings.  She is planning to down load our app after her trip to the beach.     Expected Outcomes  Short: Continue to work on weight loss.  Long: Continue to monitor her risk factors.   Short: Get new BP cuff.  Long: Continue to work weight loss.   Short: Continue to work on weight loss.  Long: Continue to monitor risk factors.   Short: Continue to work on weight loss.  Long: Continueto monitor risk factors.        Core Components/Risk Factors/Patient Goals at Discharge (Final Review):  Goals and Risk Factor Review - 04/16/19 1046      Core Components/Risk Factors/Patient Goals Review   Personal Goals Review  Weight Management/Obesity;Lipids;Hypertension    Review  DebbJackelyn Wood been doing well.  Her weight has been good and staying stable.  She continues to have good blood pressures readings.  She is planning to down load our app after her trip to the beach.     Expected Outcomes  Short: Continue to  work on weight loss.  Long: Continueto monitor risk factors.        ITP Comments: ITP Comments    Row Name 01/04/19 1337 01/10/19 0550 01/19/19 1012 01/30/19 1416 02/07/19 1206   ITP Comments  Med Review completed. Initial ITP created. Diagnosis can be found in [facility] 1/28  30 day review. Continue with ITP unless directed changes by Medical Director chart review.  Kathy Wood will be out of town on vacation for the next three weeks.    Our program is  currently closed due to COVID-19.  We are communicating with patient via phone calls and emails.    30 day review. Continue with ITP unless directed changes by Medical Director chart review.   [place] Name 05/30/19 1329           ITP Comments  30 day review cycle restarting  after being closed since March 16 because of  Covid 19 pandemic. Program opened to patients on July 6. Not all have returned. ITP updated and sent to Medical Director for review,changes as needed and signature          Comments:

## 2019-06-07 ENCOUNTER — Other Ambulatory Visit
Admission: RE | Admit: 2019-06-07 | Discharge: 2019-06-07 | Disposition: A | Discharge status: Home / Self Care | Payer: Medicare HMO | Source: Ambulatory Visit | Attending: Ophthalmology | Admitting: Ophthalmology

## 2019-06-07 ENCOUNTER — Other Ambulatory Visit: Payer: Self-pay

## 2019-06-07 DIAGNOSIS — Z1159 Encounter for screening for other viral diseases: Secondary | ICD-10-CM | POA: Diagnosis present

## 2019-06-07 LAB — SARS CORONAVIRUS 2 (TAT 6-24 HRS): SARS Coronavirus 2: NEGATIVE

## 2019-06-07 NOTE — Discharge Instructions (Signed)

## 2019-06-12 ENCOUNTER — Ambulatory Visit
Admission: RE | Admit: 2019-06-12 | Discharge: 2019-06-12 | Disposition: A | Discharge status: Home / Self Care | Payer: Medicare HMO | Attending: Ophthalmology | Admitting: Ophthalmology

## 2019-06-12 ENCOUNTER — Encounter
Admission: RE | Disposition: A | Discharge status: Home / Self Care | Payer: Self-pay | Source: Home / Self Care | Attending: Ophthalmology

## 2019-06-12 ENCOUNTER — Ambulatory Visit: Payer: Medicare HMO | Admitting: Anesthesiology

## 2019-06-12 DIAGNOSIS — Z7982 Long term (current) use of aspirin: Secondary | ICD-10-CM | POA: Diagnosis not present

## 2019-06-12 DIAGNOSIS — Z79890 Hormone replacement therapy (postmenopausal): Secondary | ICD-10-CM | POA: Insufficient documentation

## 2019-06-12 DIAGNOSIS — M810 Age-related osteoporosis without current pathological fracture: Secondary | ICD-10-CM | POA: Diagnosis not present

## 2019-06-12 DIAGNOSIS — E039 Hypothyroidism, unspecified: Secondary | ICD-10-CM | POA: Insufficient documentation

## 2019-06-12 DIAGNOSIS — Z79899 Other long term (current) drug therapy: Secondary | ICD-10-CM | POA: Diagnosis not present

## 2019-06-12 DIAGNOSIS — H2511 Age-related nuclear cataract, right eye: Secondary | ICD-10-CM | POA: Diagnosis present

## 2019-06-12 DIAGNOSIS — Z952 Presence of prosthetic heart valve: Secondary | ICD-10-CM | POA: Diagnosis not present

## 2019-06-12 HISTORY — PX: CATARACT EXTRACTION W/PHACO: SHX586

## 2019-06-12 SURGERY — PHACOEMULSIFICATION, CATARACT, WITH IOL INSERTION
Anesthesia: Monitor Anesthesia Care | Site: Eye | Laterality: Right

## 2019-06-12 MED ORDER — TETRACAINE HCL 0.5 % OP SOLN
1.0000 [drp] | OPHTHALMIC | Status: DC | PRN
  Administered 2019-06-12 (×3): 1 [drp] via OPHTHALMIC

## 2019-06-12 MED ORDER — MIDAZOLAM HCL 2 MG/2ML IJ SOLN
INTRAMUSCULAR | Status: DC | PRN
  Administered 2019-06-12: 2 mg via INTRAVENOUS

## 2019-06-12 MED ORDER — ACETAMINOPHEN 325 MG PO TABS: 325.0000 mg | ORAL_TABLET | Freq: Once | ORAL | Status: DC

## 2019-06-12 MED ORDER — LACTATED RINGERS IV SOLN: INTRAVENOUS | Status: DC

## 2019-06-12 MED ORDER — EPINEPHRINE PF 1 MG/ML IJ SOLN
INTRAOCULAR | Status: DC | PRN
  Administered 2019-06-12: 54 mL via OPHTHALMIC

## 2019-06-12 MED ORDER — ARMC OPHTHALMIC DILATING DROPS
1.0000 "application " | OPHTHALMIC | Status: DC | PRN
  Administered 2019-06-12 (×3): 1 via OPHTHALMIC

## 2019-06-12 MED ORDER — NA CHONDROIT SULF-NA HYALURON 40-17 MG/ML IO SOLN
INTRAOCULAR | Status: DC | PRN
  Administered 2019-06-12: 1 mL via INTRAOCULAR

## 2019-06-12 MED ORDER — FENTANYL CITRATE (PF) 100 MCG/2ML IJ SOLN
INTRAMUSCULAR | Status: DC | PRN
  Administered 2019-06-12: 100 ug via INTRAVENOUS

## 2019-06-12 MED ORDER — MOXIFLOXACIN HCL 0.5 % OP SOLN
OPHTHALMIC | Status: DC | PRN
  Administered 2019-06-12: 0.2 mL via OPHTHALMIC

## 2019-06-12 MED ORDER — BRIMONIDINE TARTRATE-TIMOLOL 0.2-0.5 % OP SOLN
OPHTHALMIC | Status: DC | PRN
  Administered 2019-06-12: 1 [drp] via OPHTHALMIC

## 2019-06-12 MED ORDER — ACETAMINOPHEN 160 MG/5ML PO SOLN: 325.0000 mg | Freq: Once | ORAL | Status: DC

## 2019-06-12 MED ORDER — LIDOCAINE HCL (PF) 2 % IJ SOLN
INTRAOCULAR | Status: DC | PRN
  Administered 2019-06-12: 13:00:00 2 mL

## 2019-06-12 SURGICAL SUPPLY — 19 items
CANNULA ANT/CHMB 27G (MISCELLANEOUS) ×1 IMPLANT
CANNULA ANT/CHMB 27GA (MISCELLANEOUS) ×3 IMPLANT
GLOVE SURG LX 8.0 MICRO (GLOVE) ×2
GLOVE SURG LX STRL 8.0 MICRO (GLOVE) ×1 IMPLANT
GLOVE SURG TRIUMPH 8.0 PF LTX (GLOVE) ×3 IMPLANT
GOWN STRL REUS W/ TWL LRG LVL3 (GOWN DISPOSABLE) ×2 IMPLANT
GOWN STRL REUS W/TWL LRG LVL3 (GOWN DISPOSABLE) ×6
LENS IOL TECNIS ITEC 12.0 (Intraocular Lens) ×2 IMPLANT
MARKER SKIN DUAL TIP RULER LAB (MISCELLANEOUS) ×3 IMPLANT
NDL FILTER BLUNT 18X1 1/2 (NEEDLE) ×1 IMPLANT
NDL RETROBULBAR .5 NSTRL (NEEDLE) ×3 IMPLANT
NEEDLE FILTER BLUNT 18X 1/2SAF (NEEDLE) ×2
NEEDLE FILTER BLUNT 18X1 1/2 (NEEDLE) ×1 IMPLANT
PACK EYE AFTER SURG (MISCELLANEOUS) ×3 IMPLANT
PACK OPTHALMIC (MISCELLANEOUS) ×3 IMPLANT
PACK PORFILIO (MISCELLANEOUS) ×3 IMPLANT
SYR 3ML LL SCALE MARK (SYRINGE) ×3 IMPLANT
SYR TB 1ML LUER SLIP (SYRINGE) ×3 IMPLANT
WIPE NON LINTING 3.25X3.25 (MISCELLANEOUS) ×3 IMPLANT

## 2019-06-12 NOTE — Anesthesia Postprocedure Evaluation (Signed)
Anesthesia Post Note  Patient: DAMYRA LUSCHER  Procedure(s) Performed: CATARACT EXTRACTION PHACO AND INTRAOCULAR LENS PLACEMENT (IOC)  RIGHT (Right Eye)  Patient location during evaluation: PACU Anesthesia Type: MAC Level of consciousness: awake and alert and oriented Pain management: satisfactory to patient Vital Signs Assessment: post-procedure vital signs reviewed and stable Respiratory status: spontaneous breathing, nonlabored ventilation and respiratory function stable Cardiovascular status: blood pressure returned to baseline and stable Postop Assessment: Adequate PO intake and No signs of nausea or vomiting Anesthetic complications: no    Raliegh Ip

## 2019-06-12 NOTE — Op Note (Signed)
PREOPERATIVE DIAGNOSIS:  Nuclear sclerotic cataract of the right eye.   POSTOPERATIVE DIAGNOSIS:  H25.11  CATARACT   OPERATIVE PROCEDURE: Procedure(s): CATARACT EXTRACTION PHACO AND INTRAOCULAR LENS PLACEMENT (IOC)  RIGHT   SURGEON:  Birder Robson, MD.   ANESTHESIA:  Anesthesiologist: Ronelle Nigh, MD CRNA: Mayme Genta, CRNA  1.      Managed anesthesia care. 2.      0.34ml of Shugarcaine was instilled in the eye following the paracentesis.   COMPLICATIONS:  None.   TECHNIQUE:   Stop and chop   DESCRIPTION OF PROCEDURE:  The patient was examined and consented in the preoperative holding area where the aforementioned topical anesthesia was applied to the right eye and then brought back to the Operating Room where the right eye was prepped and draped in the usual sterile ophthalmic fashion and a lid speculum was placed. A paracentesis was created with the side port blade and the anterior chamber was filled with viscoelastic. A near clear corneal incision was performed with the steel keratome. A continuous curvilinear capsulorrhexis was performed with a cystotome followed by the capsulorrhexis forceps. Hydrodissection and hydrodelineation were carried out with BSS on a blunt cannula. The lens was removed in a stop and chop  technique and the remaining cortical material was removed with the irrigation-aspiration handpiece. The capsular bag was inflated with viscoelastic and the Technis [id number]  lens was placed in the capsular bag without complication. The remaining viscoelastic was removed from the eye with the irrigation-aspiration handpiece. The wounds were hydrated. The anterior chamber was flushed with BSS and the eye was inflated to physiologic pressure. 0.71ml of Vigamox was placed in the anterior chamber. The wounds were found to be water tight. The eye was dressed with Combigan. The patient was given protective glasses to wear throughout the day and a shield with which to sleep tonight.  The patient was also given drops with which to begin a drop regimen today and will follow-up with me in one day. Implant Name Type Inv. Item Serial No. Manufacturer Lot No. LRB No. Used Action  LENS IOL DIOP 12.0 - [id number] Intraocular Lens LENS IOL DIOP 12.0 [id number] AMO  Right 1 Implanted   Procedure(s): CATARACT EXTRACTION PHACO AND INTRAOCULAR LENS PLACEMENT (IOC)  RIGHT (Right)  Electronically signed: Birder Robson 06/12/2019 1:38 PM

## 2019-06-12 NOTE — H&P (Signed)
All labs reviewed. Abnormal studies sent to patients PCP when indicated.  Previous H&P reviewed, patient examined, there are NO CHANGES.  Kathy Coon Porfilio7/28/20201:11 PM

## 2019-06-12 NOTE — Anesthesia Procedure Notes (Signed)
Procedure Name: MAC Performed by: Shyteria Lewis, CRNA Pre-anesthesia Checklist: Patient identified, Emergency Drugs available, Suction available, Timeout performed and Patient being monitored Patient Re-evaluated:Patient Re-evaluated prior to induction Oxygen Delivery Method: Nasal cannula Placement Confirmation: positive ETCO2       

## 2019-06-12 NOTE — Transfer of Care (Signed)
Immediate Anesthesia Transfer of Care Note  Patient: Kathy Wood  Procedure(s) Performed: CATARACT EXTRACTION PHACO AND INTRAOCULAR LENS PLACEMENT (IOC)  RIGHT (Right Eye)  Patient Location: PACU  Anesthesia Type: MAC  Level of Consciousness: awake, alert  and patient cooperative  Airway and Oxygen Therapy: Patient Spontanous Breathing and Patient connected to supplemental oxygen  Post-op Assessment: Post-op Vital signs reviewed, Patient's Cardiovascular Status Stable, Respiratory Function Stable, Patent Airway and No signs of Nausea or vomiting  Post-op Vital Signs: Reviewed and stable  Complications: No apparent anesthesia complications

## 2019-06-12 NOTE — Anesthesia Preprocedure Evaluation (Signed)
Anesthesia Evaluation  Patient identified by MRN, date of birth, ID band Patient awake    Reviewed: Allergy & Precautions, H&P , NPO status , Patient's Chart, lab work & pertinent test results  Airway Mallampati: II  TM Distance: >3 FB Neck ROM: full    Dental no notable dental hx.    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      Musculoskeletal negative musculoskeletal ROS (+)   Abdominal   Peds  Hematology   Anesthesia Other Findings Seen by PCP 6/16 Cardiac clearance on chart - plan had been to wait 6 months post valve placement and pacemaker. Last cardiology visit 04/26/19.  Reproductive/Obstetrics                             Anesthesia Physical  Anesthesia Plan  ASA: II  Anesthesia Plan: MAC   Post-op Pain Management:    Induction: Intravenous  PONV Risk Score and Plan: 2 and TIVA, Midazolam and Treatment may vary due to age or medical condition  Airway Management Planned:   Additional Equipment:   Intra-op Plan:   Post-operative Plan: Extubation in OR  Informed Consent: I have reviewed the patients History and Physical, chart, labs and discussed the procedure including the risks, benefits and alternatives for the proposed anesthesia with the patient or authorized representative who has indicated his/her understanding and acceptance.     Dental advisory given  Plan Discussed with: CRNA  Anesthesia Plan  Comments:         Anesthesia Quick Evaluation

## 2019-06-13 ENCOUNTER — Encounter: Payer: Self-pay | Admitting: Ophthalmology

## 2019-06-15 ENCOUNTER — Encounter: Payer: Self-pay | Admitting: *Deleted

## 2019-06-15 DIAGNOSIS — Z952 Presence of prosthetic heart valve: Secondary | ICD-10-CM

## 2019-06-15 NOTE — Progress Notes (Signed)
Discharge Progress Report  Patient Details  Name: Kathy Wood MRN: [id number] Date of Birth: May 22, 1951 Referring Provider:     Cardiac Rehab from 01/04/2019 in [facility] Cardiac [facility] Pulmonary Rehab  Referring Provider  Hande       Number of Visits: 11  Reason for Discharge:  Early Exit:  Personal  Smoking History:  Social History   Tobacco Use  Smoking Status Never Smoker  Smokeless Tobacco Never Used    Diagnosis:  S/P AVR (aortic valve replacement)  ADL UCSD:   Initial Exercise Prescription: Initial Exercise Prescription - 01/04/19 1400      Date of Initial Exercise RX and Referring Provider   Date  01/04/19    Referring Provider  Hande      Treadmill   MPH  2.6    Grade  1    Minutes  15    METs  3.35      Recumbant Bike   Level  4    RPM  60    Watts  38    Minutes  15    METs  3.6      Elliptical   Level  1    Speed  3    Minutes  15      REL-XR   Level  4    Speed  50    Minutes  15    METs  3.6      Prescription Details   Frequency (times per week)  3    Duration  Progress to 45 minutes of aerobic exercise without signs/symptoms of physical distress      Intensity   THRR 40-80% of Max Heartrate  132-148    Ratings of Perceived Exertion  11-15    Perceived Dyspnea  0-4      Progression   Progression  Continue to progress workloads to maintain intensity without signs/symptoms of physical distress.      Resistance Training   Training Prescription  Yes    Weight  3 lb   no overhead yet   Reps  10-15       Discharge Exercise Prescription (Final Exercise Prescription Changes): Exercise Prescription Changes - 01/17/19 0900      Home Exercise Plan   Plans to continue exercise at  Home (comment)   walking, country club   Frequency  Add 2 additional days to program exercise sessions.    Initial Home Exercises Provided  01/17/19       Functional Capacity: 6 Minute Walk    Row Name 01/04/19 1423         6 Minute Walk    Phase  Initial     Distance  1400 feet     Walk Time  6 minutes     # of Rest Breaks  0     MPH  2.65     METS  3.6     RPE  11     Perceived Dyspnea   1     VO2 Peak  12.6     Symptoms  No     Resting HR  118 bpm     Resting BP  126/82     Exercise Oxygen Saturation  during 6 min walk  98 %     Max Ex. HR  128 bpm     Max Ex. BP  136/78     2 Minute Post BP  124/76        Psychological, QOL, Others -  Outcomes: PHQ 2/9: Depression screen PHQ 2/9 01/04/2019  Decreased Interest 0  Down, Depressed, Hopeless 0  PHQ - 2 Score 0  Altered sleeping 0  Tired, decreased energy 1  Change in appetite 1  Feeling bad or failure about yourself  0  Trouble concentrating 0  Moving slowly or fidgety/restless 0  PHQ-9 Score 2  Difficult doing work/chores Not difficult at all    Quality of Life: Quality of Life - 01/04/19 1355      Quality of Life   Select  Quality of Life      Quality of Life Scores   Health/Function Pre  25.9 %    Socioeconomic Pre  30 %    Psych/Spiritual Pre  30 %    Family Pre  30 %    GLOBAL Pre  28.14 %       Personal Goals: Goals established at orientation with interventions provided to work toward goal. Personal Goals and Risk Factors at Admission - 01/04/19 1349      Core Components/Risk Factors/Patient Goals on Admission    Weight Management  Yes;Weight Loss    Intervention  Weight Management: Develop a combined nutrition and exercise program designed to reach desired caloric intake, while maintaining appropriate intake of nutrient and fiber, sodium and fats, and appropriate energy expenditure required for the weight goal.;Weight Management: Provide education and appropriate resources to help participant work on and attain dietary goals.;Weight Management/Obesity: Establish reasonable short term and long term weight goals.    Admit Weight  164 lb 6.4 oz (74.6 kg)   wants to gain muscle back as well as lose weight   Expected Outcomes  Short Term:  Continue to assess and modify interventions until short term weight is achieved;Long Term: Adherence to nutrition and physical activity/exercise program aimed toward attainment of established weight goal;Weight Loss: Understanding of general recommendations for a balanced deficit meal plan, which promotes 1-2 lb weight loss per week and includes a negative energy balance of [phone] kcal/d;Understanding recommendations for meals to include 15-35% energy as protein, 25-35% energy from fat, 35-60% energy from carbohydrates, less than 200mg  of dietary cholesterol, 20-35 gm of total fiber daily;Understanding of distribution of calorie intake throughout the day with the consumption of 4-5 meals/snacks    Lipids  Yes    Intervention  Provide education and support for participant on nutrition & aerobic/resistive exercise along with prescribed medications to achieve LDL 70mg , HDL >40mg .    Expected Outcomes  Short Term: Participant states understanding of desired cholesterol values and is compliant with medications prescribed. Participant is following exercise prescription and nutrition guidelines.;Long Term: Cholesterol controlled with medications as prescribed, with individualized exercise RX and with personalized nutrition plan. Value goals: LDL < 70mg , HDL > 40 mg.        Personal Goals Discharge: Goals and Risk Factor Review    Row Name 01/19/19 1023 02/12/19 1138 03/01/19 1040 04/16/19 1046       Core Components/Risk Factors/Patient Goals Review   Personal Goals Review  Weight Management/Obesity;Lipids;Hypertension  Weight Management/Obesity;Lipids;Hypertension  Weight Management/Obesity;Lipids;Hypertension  Weight Management/Obesity;Lipids;Hypertension    Review  [name] is doing well in rehab. Her weight is starting to come down.  Her pressures have been good in class and she even bought a blood pressure to take with her on vacation and to start using at home.  She is feeling good with her medications  as well.    [name] has still been able to lose some weight.  She enjoyed talking  with the dietician and is eating better now than before her event.  Her blood pressures have been good, but she thinks that she may need to get a new cuff.   [name] has been doing well with her weight.  She continues to exercise to help.  She also continues to eat better.  She continues to check her pressures and they have been good.     [name] has been doing well.  Her weight has been good and staying stable.  She continues to have good blood pressures readings.  She is planning to down load our app after her trip to the beach.     Expected Outcomes  Short: Continue to work on weight loss.  Long: Continue to monitor her risk factors.   Short: Get new BP cuff.  Long: Continue to work weight loss.   Short: Continue to work on weight loss.  Long: Continue to monitor risk factors.   Short: Continue to work on weight loss.  Long: Continueto monitor risk factors.        Exercise Goals and Review: Exercise Goals    Row Name 01/04/19 1422             Exercise Goals   Increase Physical Activity  Yes       Intervention  Provide advice, education, support and counseling about physical activity/exercise needs.;Develop an individualized exercise prescription for aerobic and resistive training based on initial evaluation findings, risk stratification, comorbidities and participant's personal goals.       Expected Outcomes  Short Term: Attend rehab on a regular basis to increase amount of physical activity.;Long Term: Add in home exercise to make exercise part of routine and to increase amount of physical activity.;Long Term: Exercising regularly at least 3-5 days a week.       Increase Strength and Stamina  Yes       Intervention  Provide advice, education, support and counseling about physical activity/exercise needs.;Develop an individualized exercise prescription for aerobic and resistive training based on initial evaluation  findings, risk stratification, comorbidities and participant's personal goals.       Expected Outcomes  Short Term: Increase workloads from initial exercise prescription for resistance, speed, and METs.;Short Term: Perform resistance training exercises routinely during rehab and add in resistance training at home;Long Term: Improve cardiorespiratory fitness, muscular endurance and strength as measured by increased METs and functional capacity (6MWT)       Able to understand and use rate of perceived exertion (RPE) scale  Yes       Intervention  Provide education and explanation on how to use RPE scale       Expected Outcomes  Short Term: Able to use RPE daily in rehab to express subjective intensity level;Long Term:  Able to use RPE to guide intensity level when exercising independently       Knowledge and understanding of Target Heart Rate Range (THRR)  Yes       Intervention  Provide education and explanation of THRR including how the numbers were predicted and where they are located for reference       Expected Outcomes  Short Term: Able to state/look up THRR;Short Term: Able to use daily as guideline for intensity in rehab;Long Term: Able to use THRR to govern intensity when exercising independently       Able to check pulse independently  Yes       Intervention  Provide education and demonstration on how to check pulse in carotid and  radial arteries.;Review the importance of being able to check your own pulse for safety during independent exercise       Expected Outcomes  Short Term: Able to explain why pulse checking is important during independent exercise;Long Term: Able to check pulse independently and accurately       Understanding of Exercise Prescription  Yes       Intervention  Provide education, explanation, and written materials on patient's individual exercise prescription       Expected Outcomes  Short Term: Able to explain program exercise prescription;Long Term: Able to explain home  exercise prescription to exercise independently          Exercise Goals Re-Evaluation: Exercise Goals Re-Evaluation    Row Name 01/08/19 0752 01/16/19 1604 01/17/19 0908 01/19/19 1012 01/30/19 1417     Exercise Goal Re-Evaluation   Exercise Goals Review  Increase Physical Activity;Increase Strength and Stamina;Able to understand and use rate of perceived exertion (RPE) scale;Knowledge and understanding of Target Heart Rate Range (THRR);Understanding of Exercise Prescription  Increase Physical Activity;Increase Strength and Stamina;Understanding of Exercise Prescription  Increase Physical Activity;Increase Strength and Stamina;Understanding of Exercise Prescription;Able to understand and use rate of perceived exertion (RPE) scale;Knowledge and understanding of Target Heart Rate Range (THRR);Able to check pulse independently  Increase Physical Activity;Increase Strength and Stamina;Understanding of Exercise Prescription  -   Comments  Reviewed RPE scale, THR and program prescription with pt today.  Pt voiced understanding and was given a copy of goals to take home.   [name] is off to a good start in rehab.  She is already up to 3.6 METs on the recumbent bike.  We will continue to monitor her progress.   Reviewed home exercise with pt today.  Pt plans to walk and use machines for exercise.  She is also a member of the [employer].  Reviewed THR, pulse, RPE, sign and symptoms, NTG use, and when to call 911 or MD.  Also discussed weather considerations and indoor options.  Pt voiced understanding.  [name] has been doing well with rehab.  She feels better about where she is now as she was terrified to exercise before coming into program after her event.  Now she is already much more confident and ready to get back to her routine.  She will be walking while out on vacation and using some machines at the hotel.   She feels that her stamina is back and still climbing.   Out on vacation since last review    Expected Outcomes  Short: Use RPE daily to regulate intensity. Long: Follow program prescription in THR.  Short: Continue to attend regularly  Long: Continue to improve strength and stamina.   Short: Walk while out on vacation.  Long: Continue to improve strength and stamina.   Short: Walk while out on vacation.  Long: Continue to improve strength and stamina.   -   Row Name 02/12/19 1131 03/01/19 1033 03/23/19 1010 04/16/19 1040 05/09/19 1134     Exercise Goal Re-Evaluation   Exercise Goals Review  Increase Physical Activity;Increase Strength and Stamina;Understanding of Exercise Prescription  Increase Physical Activity;Increase Strength and Stamina;Understanding of Exercise Prescription  Increase Physical Activity;Increase Strength and Stamina;Understanding of Exercise Prescription  -  Increase Physical Activity;Increase Strength and Stamina;Understanding of Exercise Prescription   Comments  [name] has been doing well.  She enjoyed her vacation to [place].  She was able to stay active while down there. Since getting back she has been using our videos and  stuff from [employer] to stay acitve.  She is feeling much better and more confident.  She would like to come back for one day once we get back to finalize everything and then graduation.   [name] has been doing well at home. She continues to get and walk regularly.  She has also been using the videos from [employer] and from the class [employer] has set up on [place].  She is enjoying all the videos and the antics of [employer] animals.  She has also used our videos for workouts several times.  She is feeling good and still planning to finsih quickly once we are able to return.   [name] is doing well.  She continues to walk daily and feels that she is getting faster!!  She also continues to use our videos and those from [employer]'s class.  She is eager to get back to officially graduate!  [name] has been doing well with exercise.  She continues to walk  daily and use videos for variety. She is headed to the beach for the week and looking forward to walking on the beach.   She is planning to down load the new app once she returns.   [name] is doing well.  She is at the beach again this week.  She is walking daily and doing some videos.  She would like to return to rehab.  She has several appts coming up that need to be schedule before getting back on our schedule.   Expected Outcomes  Short: Continue to exercise. Long: Continue to increase activity levels.   Short: Continue to use videos for exercise and walk daily.  Long: Continue to rebuild strength and stamina.   Short: Continue walk and use videos.  Long: Continue to build stamina  Short: Continue walk and use videos.  Long: Continue to build stamina  Short: Get rescheduled with [place] to graduate.  Long: Continue to exercise regularly.      Nutrition & Weight - Outcomes: Pre Biometrics - 01/04/19 1422      Pre Biometrics   Height  5' 4.5" (1.638 m)    Weight  164 lb 6.4 oz (74.6 kg)    Waist Circumference  35.5 inches    Hip Circumference  41 inches    Waist to Hip Ratio  0.87 %    BMI (Calculated)  27.79    Single Leg Stand  30 seconds        Nutrition: Nutrition Therapy & Goals - 02/07/19 1154      Nutrition Therapy   Diet  Healthy heart, low sodium 1500-1600kcal (BMI 27.79)(wst: 35.5 inches)    Protein (specify units)  65-75g    Fiber  25 grams    Whole Grain Foods  3 servings    Saturated Fats  12 max. grams    Fruits and Vegetables  5 servings/day    Sodium  1.5 grams      Personal Nutrition Goals   Nutrition Goal  ST: increase exercise with current health crisis (COVID 19). LT: continue with HH diet    Comments  Pt reports gaining her appetite back, but her baseline is now lower than before the surgery; discussed her energy needs for normal functioning. B: yogurt w/ egg + black tea (mentioned iron and black tea - to just be mindful). L half soup or half sandwhich. D: lean  meat with vegetable. Pt reports losing weight, discussed healthy weight loss and calroie needs as well as muscle mass.  Intervention Plan   Intervention  Prescribe, educate and counsel regarding individualized specific dietary modifications aiming towards targeted core components such as weight, hypertension, lipid management, diabetes, heart failure and other comorbidities.    Expected Outcomes  Short Term Goal: Understand basic principles of dietary content, such as calories, fat, sodium, cholesterol and nutrients.;Short Term Goal: A plan has been developed with personal nutrition goals set during dietitian appointment.;Long Term Goal: Adherence to prescribed nutrition plan.       Nutrition Discharge: Nutrition Assessments - 01/04/19 1355      MEDFICTS Scores   Pre Score  31       Education Questionnaire Score: Knowledge Questionnaire Score - 01/04/19 1350      Knowledge Questionnaire Score   Pre Score  26/26       Goals reviewed with patient; copy given to patient.

## 2019-06-15 NOTE — Progress Notes (Signed)
Cardiac Individual Treatment Plan  Patient Details  Name: Kathy Wood MRN: [id number] Date of Birth: 1951/02/09 Referring Provider:     Cardiac Rehab from 01/04/2019 in [facility] Cardiac and Pulmonary Rehab  Referring Provider  Hande      Initial Encounter Date:    Cardiac Rehab from 01/04/2019 in [facility] Cardiac and Pulmonary Rehab  Date  01/04/19      Visit Diagnosis: No diagnosis found.  Patient's Home Medications on Admission:  Current Outpatient Medications:  .  alendronate (FOSAMAX) 70 MG tablet, Take 70 mg by mouth once a week., Disp: , Rfl:  .  aspirin 81 MG chewable tablet, Chew by mouth., Disp: , Rfl:  .  calcium carbonate (OS-CAL) 600 MG TABS tablet, Take 600 mg by mouth daily., Disp: , Rfl:  .  Cholecalciferol (VITAMIN D3) 25 MCG (1000 UT) CAPS, Take by mouth., Disp: , Rfl:  .  levothyroxine (SYNTHROID, LEVOTHROID) 75 MCG tablet, , Disp: , Rfl:  .  metoprolol tartrate (LOPRESSOR) 25 MG tablet, Take 12.5 mg by mouth 2 (two) times daily., Disp: , Rfl:  .  Multiple Vitamin (MULTIVITAMIN) tablet, Take 1 tablet by mouth daily., Disp: , Rfl:  .  pravastatin (PRAVACHOL) 40 MG tablet, Take 40 mg by mouth daily., Disp: , Rfl:   Past Medical History: Past Medical History:  Diagnosis Date  . Aortic valvar stenosis    Valve replacement. [facility]. 12/13/18  . 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 Cancer of left breast ([place]) 2009  . Hyperlipidemia   . Hypothyroidism   . Osteoporosis   . Personal history of chemotherapy 2009  . Personal history of radiation therapy 2006  . 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 Medtronic Azure Xt Dr Mcarthur Rossetti. [id number]    Tobacco Use: Social History   Tobacco Use  Smoking Status Never Smoker  Smokeless Tobacco Never Used    Labs: Recent Review Flowsheet Data    There is no flowsheet data to display.       Exercise Target Goals: Exercise  Program Goal: Individual exercise prescription set using results from initial 6 min walk test and THRR while considering  patient's activity barriers and safety.   Exercise Prescription Goal: Initial exercise prescription builds to 30-45 minutes a day of aerobic activity, 2-3 days per week.  Home exercise guidelines will be given to patient during program as part of exercise prescription that the participant will acknowledge.  Activity Barriers & Risk Stratification: Activity Barriers & Cardiac Risk Stratification - 01/04/19 1358      Activity Barriers & Cardiac Risk Stratification   Activity Barriers  None    Cardiac Risk Stratification  Low       6 Minute Walk: 6 Minute Walk    Row Name 01/04/19 1423         6 Minute Walk   Phase  Initial     Distance  1400 feet     Walk Time  6 minutes     # of Rest Breaks  0     MPH  2.65     METS  3.6     RPE  11     Perceived Dyspnea   1     VO2 Peak  12.6     Symptoms  No     Resting HR  118 bpm     Resting BP  126/82  Exercise Oxygen Saturation  during 6 min walk  98 %     Max Ex. HR  128 bpm     Max Ex. BP  136/78     2 Minute Post BP  124/76        Oxygen Initial Assessment:   Oxygen Re-Evaluation:   Oxygen Discharge (Final Oxygen Re-Evaluation):   Initial Exercise Prescription: Initial Exercise Prescription - 01/04/19 1400      Date of Initial Exercise RX and Referring Provider   Date  01/04/19    Referring Provider  Hande      Treadmill   MPH  2.6    Grade  1    Minutes  15    METs  3.35      Recumbant Bike   Level  4    RPM  60    Watts  38    Minutes  15    METs  3.6      Elliptical   Level  1    Speed  3    Minutes  15      REL-XR   Level  4    Speed  50    Minutes  15    METs  3.6      Prescription Details   Frequency (times per week)  3    Duration  Progress to 45 minutes of aerobic exercise without signs/symptoms of physical distress      Intensity   THRR 40-80% of Max  Heartrate  132-148    Ratings of Perceived Exertion  11-15    Perceived Dyspnea  0-4      Progression   Progression  Continue to progress workloads to maintain intensity without signs/symptoms of physical distress.      Resistance Training   Training Prescription  Yes    Weight  3 lb   no overhead yet   Reps  10-15       Perform Capillary Blood Glucose checks as needed.  Exercise Prescription Changes: Exercise Prescription Changes    Row Name 01/04/19 1400 01/16/19 1600 01/17/19 0900         Response to Exercise   Blood Pressure (Admit)  126/82  124/70  -     Blood Pressure (Exercise)  136/78  156/72  -     Blood Pressure (Exit)  124/76  102/62  -     Heart Rate (Admit)  118 bpm  114 bpm  -     Heart Rate (Exercise)  128 bpm  130 bpm  -     Heart Rate (Exit)  123 bpm  86 bpm  -     Oxygen Saturation (Exit)  98 %  -  -     Rating of Perceived Exertion (Exercise)  11  17  -     Symptoms  -  none  -     Duration  -  Continue with 45 min of aerobic exercise without signs/symptoms of physical distress.  -     Intensity  -  THRR unchanged  -       Progression   Progression  -  Continue to progress workloads to maintain intensity without signs/symptoms of physical distress.  -     Average METs  -  3.48  -       Resistance Training   Training Prescription  -  Yes  -     Weight  -  3 lbs  -  Reps  -  10-15  -       Interval Training   Interval Training  -  No  -       Treadmill   MPH  -  2.6  -     Grade  -  1  -     Minutes  -  15  -     METs  -  3.35  -       Recumbant Bike   Level  -  3  -     Minutes  -  15  -     METs  -  3.6  -       Elliptical   Level  -  1  -     Speed  -  3  -     Minutes  -  15  -       Home Exercise Plan   Plans to continue exercise at  -  -  Home (comment) walking, country club     Frequency  -  -  Add 2 additional days to program exercise sessions.     Initial Home Exercises Provided  -  -  01/17/19        Exercise  Comments: Exercise Comments    Row Name 01/08/19 6644 01/17/19 0908         Exercise Comments   First full day of exercise!  Patient was oriented to gym and equipment including functions, settings, policies, and procedures.  Patient's individual exercise prescription and treatment plan were reviewed.  All starting workloads were established based on the results of the 6 minute walk test done at initial orientation visit.  The plan for exercise progression was also introduced and progression will be customized based on patient's performance and goals.  Kathy Wood will be going on vacation for three weeks starting next week.  When she returns, she may only stay for a week before changing to [employer] due to her insurance copay.          Exercise Goals and Review: Exercise Goals    Row Name 01/04/19 1422             Exercise Goals   Increase Physical Activity  Yes       Intervention  Provide advice, education, support and counseling about physical activity/exercise needs.;Develop an individualized exercise prescription for aerobic and resistive training based on initial evaluation findings, risk stratification, comorbidities and participant's personal goals.       Expected Outcomes  Short Term: Attend rehab on a regular basis to increase amount of physical activity.;Long Term: Add in home exercise to make exercise part of routine and to increase amount of physical activity.;Long Term: Exercising regularly at least 3-5 days a week.       Increase Strength and Stamina  Yes       Intervention  Provide advice, education, support and counseling about physical activity/exercise needs.;Develop an individualized exercise prescription for aerobic and resistive training based on initial evaluation findings, risk stratification, comorbidities and participant's personal goals.       Expected Outcomes  Short Term: Increase workloads from initial exercise prescription for resistance, speed, and METs.;Short  Term: Perform resistance training exercises routinely during rehab and add in resistance training at home;Long Term: Improve cardiorespiratory fitness, muscular endurance and strength as measured by increased METs and functional capacity (6MWT)       Able to understand and use rate of perceived exertion (RPE)  scale  Yes       Intervention  Provide education and explanation on how to use RPE scale       Expected Outcomes  Short Term: Able to use RPE daily in rehab to express subjective intensity level;Long Term:  Able to use RPE to guide intensity level when exercising independently       Knowledge and understanding of Target Heart Rate Range (THRR)  Yes       Intervention  Provide education and explanation of THRR including how the numbers were predicted and where they are located for reference       Expected Outcomes  Short Term: Able to state/look up THRR;Short Term: Able to use daily as guideline for intensity in rehab;Long Term: Able to use THRR to govern intensity when exercising independently       Able to check pulse independently  Yes       Intervention  Provide education and demonstration on how to check pulse in carotid and radial arteries.;Review the importance of being able to check your own pulse for safety during independent exercise       Expected Outcomes  Short Term: Able to explain why pulse checking is important during independent exercise;Long Term: Able to check pulse independently and accurately       Understanding of Exercise Prescription  Yes       Intervention  Provide education, explanation, and written materials on patient's individual exercise prescription       Expected Outcomes  Short Term: Able to explain program exercise prescription;Long Term: Able to explain home exercise prescription to exercise independently          Exercise Goals Re-Evaluation : Exercise Goals Re-Evaluation    Row Name 01/08/19 0752 01/16/19 1604 01/17/19 0908 01/19/19 1012 01/30/19 1417      Exercise Goal Re-Evaluation   Exercise Goals Review  Increase Physical Activity;Increase Strength and Stamina;Able to understand and use rate of perceived exertion (RPE) scale;Knowledge and understanding of Target Heart Rate Range (THRR);Understanding of Exercise Prescription  Increase Physical Activity;Increase Strength and Stamina;Understanding of Exercise Prescription  Increase Physical Activity;Increase Strength and Stamina;Understanding of Exercise Prescription;Able to understand and use rate of perceived exertion (RPE) scale;Knowledge and understanding of Target Heart Rate Range (THRR);Able to check pulse independently  Increase Physical Activity;Increase Strength and Stamina;Understanding of Exercise Prescription  -   Comments  Reviewed RPE scale, THR and program prescription with pt today.  Pt voiced understanding and was given a copy of goals to take home.   Kathy Wood is off to a good start in rehab.  She is already up to 3.6 METs on the recumbent bike.  We will continue to monitor her progress.   Reviewed home exercise with pt today.  Pt plans to walk and use machines for exercise.  She is also a member of the [employer].  Reviewed THR, pulse, RPE, sign and symptoms, NTG use, and when to call 911 or MD.  Also discussed weather considerations and indoor options.  Pt voiced understanding.  Kathy Wood has been doing well with rehab.  She feels better about where she is now as she was terrified to exercise before coming into program after her event.  Now she is already much more confident and ready to get back to her routine.  She will be walking while out on vacation and using some machines at the hotel.   She feels that her stamina is back and still climbing.   Out on vacation since last  review   Expected Outcomes  Short: Use RPE daily to regulate intensity. Long: Follow program prescription in THR.  Short: Continue to attend regularly  Long: Continue to improve strength and stamina.   Short: Walk while out on  vacation.  Long: Continue to improve strength and stamina.   Short: Walk while out on vacation.  Long: Continue to improve strength and stamina.   -   Row Name 02/12/19 1131 03/01/19 1033 03/23/19 1010 04/16/19 1040 05/09/19 1134     Exercise Goal Re-Evaluation   Exercise Goals Review  Increase Physical Activity;Increase Strength and Stamina;Understanding of Exercise Prescription  Increase Physical Activity;Increase Strength and Stamina;Understanding of Exercise Prescription  Increase Physical Activity;Increase Strength and Stamina;Understanding of Exercise Prescription  -  Increase Physical Activity;Increase Strength and Stamina;Understanding of Exercise Prescription   Comments  Kathy Wood has been doing well.  She enjoyed her vacation to [place].  She was able to stay active while down there. Since getting back she has been using our videos and stuff from [place] to stay acitve.  She is feeling much better and more confident.  She would like to come back for one day once we get back to finalize everything and then graduation.   Kathy Wood has been doing well at home. She continues to get and walk regularly.  She has also been using the videos from [employer] and from the class [employer] has set up on [place].  She is enjoying all the videos and the antics of [employer] animals.  She has also used our videos for workouts several times.  She is feeling good and still planning to finsih quickly once we are able to return.   Kathy Wood is doing well.  She continues to walk daily and feels that she is getting faster!!  She also continues to use our videos and those from [employer] class.  She is eager to get back to officially graduate!  Kathy Wood has been doing well with exercise.  She continues to walk daily and use videos for variety. She is headed to the beach for the week and looking forward to walking on the beach.   She is planning to down load the new app once she returns.   Kathy Wood is doing well.  She is at the  beach again this week.  She is walking daily and doing some videos.  She would like to return to rehab.  She has several appts coming up that need to be schedule before getting back on our schedule.   Expected Outcomes  Short: Continue to exercise. Long: Continue to increase activity levels.   Short: Continue to use videos for exercise and walk daily.  Long: Continue to rebuild strength and stamina.   Short: Continue walk and use videos.  Long: Continue to build stamina  Short: Continue walk and use videos.  Long: Continue to build stamina  Short: Get rescheduled with [place] to graduate.  Long: Continue to exercise regularly.      Discharge Exercise Prescription (Final Exercise Prescription Changes): Exercise Prescription Changes - 01/17/19 0900      Home Exercise Plan   Plans to continue exercise at  Home (comment)   walking, country club   Frequency  Add 2 additional days to program exercise sessions.    Initial Home Exercises Provided  01/17/19       Nutrition:  Target Goals: Understanding of nutrition guidelines, daily intake of sodium <1559m, cholesterol <201m calories 30% from fat and 7% or less from saturated fats,  daily to have 5 or more servings of fruits and vegetables.  Biometrics: Pre Biometrics - 01/04/19 1422      Pre Biometrics   Height  5' 4.5" (1.638 m)    Weight  164 lb 6.4 oz (74.6 kg)    Waist Circumference  35.5 inches    Hip Circumference  41 inches    Waist to Hip Ratio  0.87 %    BMI (Calculated)  27.79    Single Leg Stand  30 seconds        Nutrition Therapy Plan and Nutrition Goals: Nutrition Therapy & Goals - 02/07/19 1154      Nutrition Therapy   Diet  Healthy heart, low sodium 1500-1600kcal (BMI 27.79)(wst: 35.5 inches)    Protein (specify units)  65-75g    Fiber  25 grams    Whole Grain Foods  3 servings    Saturated Fats  12 max. grams    Fruits and Vegetables  5 servings/day    Sodium  1.5 grams      Personal Nutrition Goals   Nutrition  Goal  ST: increase exercise with current health crisis (COVID 19). LT: continue with HH diet    Comments  Pt reports gaining her appetite back, but her baseline is now lower than before the surgery; discussed her energy needs for normal functioning. B: yogurt w/ egg + black tea (mentioned iron and black tea - to just be mindful). L half soup or half sandwhich. D: lean meat with vegetable. Pt reports losing weight, discussed healthy weight loss and calroie needs as well as muscle mass.       Intervention Plan   Intervention  Prescribe, educate and counsel regarding individualized specific dietary modifications aiming towards targeted core components such as weight, hypertension, lipid management, diabetes, heart failure and other comorbidities.    Expected Outcomes  Short Term Goal: Understand basic principles of dietary content, such as calories, fat, sodium, cholesterol and nutrients.;Short Term Goal: A plan has been developed with personal nutrition goals set during dietitian appointment.;Long Term Goal: Adherence to prescribed nutrition plan.       Nutrition Assessments: Nutrition Assessments - 01/04/19 1355      MEDFICTS Scores   Pre Score  31       Nutrition Goals Re-Evaluation: Nutrition Goals Re-Evaluation    [place] Name 01/19/19 1020 04/13/19 1204           Goals   Current Weight  -  157 lb (71.2 kg)      Nutrition Goal  Heart Healthy, Increase fluids, watch sodium   ST/LT: continue [place] eating and exercise, "I'm back to normal, I don't really have anymore goals"      Comment  Kathy Wood has been doing well with her diet.  Her appetite is still not back to where she wants it to be yet.  However, her weight is coming down and she is trying to make good choices.  She is getting in fruits and vegetables.  She is also trying to watch her sodium intake.  She has cut out sugared beverages and drinks mostly water.  One thing she has noted that is weird for her is that she is no longer craving  sweets and especially not chocolate when that used to be one of her favorites.   Appetite is back! B: smoothies in morning, scrambled egg or english muffin or yogurt (will eat more) and fresh fruit. L: half sandwich - turkey/chicken/ham lunch meat, pickles, half orange. Drinks water  throughout the day and some tea in AM. D: piece of meat (chicken breat/thighs, meatballs, etc) and salad      Expected Outcome  Short: Continue to eat healthier and gain appetite.  Long: Continue to follow heart healthy diet.   Continue to follow heart healthy diet.          Nutrition Goals Discharge (Final Nutrition Goals Re-Evaluation): Nutrition Goals Re-Evaluation - 04/13/19 1204      Goals   Current Weight  157 lb (71.2 kg)    Nutrition Goal  ST/LT: continue HH eating and exercise, "I'm back to normal, I don't really have anymore goals"    Comment  Appetite is back! B: smoothies in morning, scrambled egg or english muffin or yogurt (will eat more) and fresh fruit. L: half sandwich - turkey/chicken/ham lunch meat, pickles, half orange. Drinks water throughout the day and some tea in AM. D: piece of meat (chicken breat/thighs, meatballs, etc) and salad    Expected Outcome  Continue to follow heart healthy diet.        Psychosocial: Target Goals: Acknowledge presence or absence of significant depression and/or stress, maximize coping skills, provide positive support system. Participant is able to verbalize types and ability to use techniques and skills needed for reducing stress and depression.   Initial Review & Psychosocial Screening: Initial Psych Review & Screening - 01/04/19 1350      Initial Review   Current issues with  Current Stress Concerns    Source of Stress Concerns  Unable to participate in former interests or hobbies    Comments  Debbie's shortness of breath has become an issue after her valve replacement. She has lived a very active lifestyle and is looking to get back to that. She knows it will  take time and is very motivated to get going.       Family Dynamics   Good Support System?  Yes   spouse     Barriers   Psychosocial barriers to participate in program  There are no identifiable barriers or psychosocial needs.;The patient should benefit from training in stress management and relaxation.      Screening Interventions   Interventions  Encouraged to exercise;Program counselor consult;Provide feedback about the scores to participant;To provide support and resources with identified psychosocial needs    Expected Outcomes  Short Term goal: Utilizing psychosocial counselor, staff and physician to assist with identification of specific Stressors or current issues interfering with healing process. Setting desired goal for each stressor or current issue identified.;Long Term Goal: Stressors or current issues are controlled or eliminated.;Short Term goal: Identification and review with participant of any Quality of Life or Depression concerns found by scoring the questionnaire.;Long Term goal: The participant improves quality of Life and PHQ9 Scores as seen by post scores and/or verbalization of changes       Quality of Life Scores:  Quality of Life - 01/04/19 1355      Quality of Life   Select  Quality of Life      Quality of Life Scores   Health/Function Pre  25.9 %    Socioeconomic Pre  30 %    Psych/Spiritual Pre  30 %    Family Pre  30 %    GLOBAL Pre  28.14 %      Scores of 19 and below usually indicate a poorer quality of life in these areas.  A difference of  2-3 points is a clinically meaningful difference.  A difference of 2-3  points in the total score of the Quality of Life Index has been associated with significant improvement in overall quality of life, self-image, physical symptoms, and general health in studies assessing change in quality of life.  PHQ-9: Recent Review Flowsheet Data    Depression screen [facility] 2/9 01/04/2019   Decreased Interest 0   Down,  Depressed, Hopeless 0   PHQ - 2 Score 0   Altered sleeping 0   Tired, decreased energy 1   Change in appetite 1   Feeling bad or failure about yourself  0   Trouble concentrating 0   Moving slowly or fidgety/restless 0   PHQ-9 Score 2   Difficult doing work/chores Not difficult at all     Interpretation of Total Score  Total Score Depression Severity:  1-4 = Minimal depression, 5-9 = Mild depression, 10-14 = Moderate depression, 15-19 = Moderately severe depression, 20-27 = Severe depression   Psychosocial Evaluation and Intervention: Psychosocial Evaluation - 01/08/19 0910      Psychosocial Evaluation & Interventions   Interventions  Encouraged to exercise with the program and follow exercise prescription;Stress management education    Comments  Counselor met with Ms. Juncaj Kathy Wood) today for initial psychosocial evaluation.  She is 68 year old who had a aortic valve replacement and pacemaker inserted (3) weeks ago.  She has a strong support system with a spouse of 89 years; (3) adult children and active involvement in her local church.  Kathy Wood is cancer free currently after having breast cancer twice and a mastectomy of her left breast.  She also struggles with hypothyroidism and takes medication for that.  She reports sleeping well and her appetite has not returned yet since the surgery. She denies a history of depression or anxiety or any current symptoms and is typically in a positive mood most of the time.  Her primary stressors are her health and being the caregiver for her 48 year old mother in law who lives in [facility] assisted living.  Kathy Wood has goals to increase her energy and recognize her exercise limits while in this program.  She will be followed by staff.      Expected Outcomes  Short:  Kathy Wood will exercise consistently to re-gain her energy levels and determine her limits in her exercise regimen.  She will also exercise for stress reduction.  Long:  Kathy Wood will develop a  routine of exercise for her health and to manage her stress positively.     Continue Psychosocial Services   Follow up required by staff       Psychosocial Re-Evaluation: Psychosocial Re-Evaluation    [place] Name 01/19/19 1017 02/12/19 1136 03/01/19 1035 03/23/19 1014 04/16/19 1044     Psychosocial Re-Evaluation   Current issues with  Current Stress Concerns  Current Stress Concerns  Current Stress Concerns  Current Stress Concerns  Current Stress Concerns   Comments  Kathy Wood is doing well mentally. Her major stressors continue to be her health and her caregiver responsibilites.  In generally, things are going well and improving.  She has more confidence with exercise now.  She is looking forward to her vacation!!  When she gets back, she may only do the program for another week before switching to [employer] mode due to insurance copays.  Debbie enjoyed her vacation.  She was able to get out to do most things.  She has been tolerating the stresses of the current situation well.  She continues to exercise to help her deal with everything.  Kathy Wood is doing well at home.  She is able to get to walk with a friend daily. She has been using FaceTime and Zoom to connect with friends and family.  She is also using her exercise to help with her stress.  She is having a little difficulty with a recurring UTI, but her doctor called in an antibiotic for her and she is feeling better again.  She mentioned that 6 yo mother in law is having a hard with face masking as she can't read lips anymore so the facetime is a great way for them to both get a good connection.   Kathy Wood is holding up well and staying connected. She is eager to get back to normal.   Kathy Wood is doing well at home.  She continues to stay connected with videos and zoom meetings.  She is headed to the beach for the week and looking forward to a chance to unplug some.   Expected Outcomes  Short: Enjoy vacation.  Long: Continue to stay positive!  Short:  Continue to exercise.  Long: Continue to stay positive!  Short: Continue to exercise for mood boost and connections.  Long: Continue to connect with friends and family virtually.   Short: Continue to connect.  Long: Continue to exercise for moof boost.   Short: Enjoy vacation.  Long: Continue to stay connected and exercise   Interventions  Encouraged to attend Cardiac Rehabilitation for the exercise;Stress management education  -  Encouraged to attend Cardiac Rehabilitation for the exercise  Encouraged to attend Cardiac Rehabilitation for the exercise  -   Continue Psychosocial Services   Follow up required by staff  -  Follow up required by staff  Follow up required by staff  Follow up required by staff   [place] Name 05/09/19 1135             Psychosocial Re-Evaluation   Current issues with  Current Stress Concerns       Comments  Kathy Wood is doing well at home. She is at the beach this week and waiting to hear from doctors to schedule her cataract surgery.  Once she knows her schedule she is eager to return to class to graduate!       Expected Outcomes  Short: Enjoy the beach and schedule surgery.  Long: Continue to stay positive.       Interventions  Encouraged to attend Cardiac Rehabilitation for the exercise       Continue Psychosocial Services   Follow up required by staff          Psychosocial Discharge (Final Psychosocial Re-Evaluation): Psychosocial Re-Evaluation - 05/09/19 1135      Psychosocial Re-Evaluation   Current issues with  Current Stress Concerns    Comments  Kathy Wood is doing well at home. She is at the beach this week and waiting to hear from doctors to schedule her cataract surgery.  Once she knows her schedule she is eager to return to class to graduate!    Expected Outcomes  Short: Enjoy the beach and schedule surgery.  Long: Continue to stay positive.    Interventions  Encouraged to attend Cardiac Rehabilitation for the exercise    Continue Psychosocial Services   Follow  up required by staff       Vocational Rehabilitation: Provide vocational rehab assistance to qualifying candidates.   Vocational Rehab Evaluation & Intervention: Vocational Rehab - 01/04/19 1350      Initial Vocational Rehab Evaluation & Intervention   Assessment shows  need for Vocational Rehabilitation  No       Education: Education Goals: Education classes will be provided on a variety of topics geared toward better understanding of heart health and risk factor modification. Participant will state understanding/return demonstration of topics presented as noted by education test scores.  Learning Barriers/Preferences: Learning Barriers/Preferences - 01/04/19 1350      Learning Barriers/Preferences   Learning Barriers  None    Learning Preferences  None       Education Topics:  AED/CPR: - Group verbal and written instruction with the use of models to demonstrate the basic use of the AED with the basic ABC's of resuscitation.   Cardiac Rehab from 01/17/2019 in [facility] Cardiac and Pulmonary Rehab  Date  01/08/19  Educator  SB  Instruction Review Code  1- Verbalizes Understanding      General Nutrition Guidelines/Fats and Fiber: -Group instruction provided by verbal, written material, models and posters to present the general guidelines for heart healthy nutrition. Gives an explanation and review of dietary fats and fiber.   Controlling Sodium/Reading Food Labels: -Group verbal and written material supporting the discussion of sodium use in heart healthy nutrition. Review and explanation with models, verbal and written materials for utilization of the food label.   Exercise Physiology & General Exercise Guidelines: - Group verbal and written instruction with models to review the exercise physiology of the cardiovascular system and associated critical values. Provides general exercise guidelines with specific guidelines to those with heart or lung disease.    Aerobic Exercise  & Resistance Training: - Gives group verbal and written instruction on the various components of exercise. Focuses on aerobic and resistive training programs and the benefits of this training and how to safely progress through these programs..   Flexibility, Balance, Mind/Body Relaxation: Provides group verbal/written instruction on the benefits of flexibility and balance training, including mind/body exercise modes such as yoga, pilates and tai chi.  Demonstration and skill practice provided.   Stress and Anxiety: - Provides group verbal and written instruction about the health risks of elevated stress and causes of high stress.  Discuss the correlation between heart/lung disease and anxiety and treatment options. Review healthy ways to manage with stress and anxiety.   Depression: - Provides group verbal and written instruction on the correlation between heart/lung disease and depressed mood, treatment options, and the stigmas associated with seeking treatment.   Cardiac Rehab from 01/17/2019 in [facility] Cardiac and Pulmonary Rehab  Date  01/17/19  Educator  KG  Instruction Review Code  1- Verbalizes Understanding      Anatomy & Physiology of the Heart: - Group verbal and written instruction and models provide basic cardiac anatomy and physiology, with the coronary electrical and arterial systems. Review of Valvular disease and Heart Failure   Cardiac Procedures: - Group verbal and written instruction to review commonly prescribed medications for heart disease. Reviews the medication, class of the drug, and side effects. Includes the steps to properly store meds and maintain the prescription regimen. (beta blockers and nitrates)   Cardiac Medications I: - Group verbal and written instruction to review commonly prescribed medications for heart disease. Reviews the medication, class of the drug, and side effects. Includes the steps to properly store meds and maintain the prescription  regimen.   Cardiac Medications II: -Group verbal and written instruction to review commonly prescribed medications for heart disease. Reviews the medication, class of the drug, and side effects. (all other drug classes)   Cardiac Rehab from  01/17/2019 in [facility] Cardiac and Pulmonary Rehab  Date  01/15/19  Educator  SB  Instruction Review Code  1- Verbalizes Understanding       Go Sex-Intimacy & Heart Disease, Get SMART - Goal Setting: - Group verbal and written instruction through game format to discuss heart disease and the return to sexual intimacy. Provides group verbal and written material to discuss and apply goal setting through the application of the S.M.A.R.T. Method.   Other Matters of the Heart: - Provides group verbal, written materials and models to describe Stable Angina and Peripheral Artery. Includes description of the disease process and treatment options available to the cardiac patient.   Exercise & Equipment Safety: - Individual verbal instruction and demonstration of equipment use and safety with use of the equipment.   Cardiac Rehab from 01/17/2019 in [facility] Cardiac and Pulmonary Rehab  Date  01/04/19  Educator  [facility]  Instruction Review Code  1- Verbalizes Understanding      Infection Prevention: - Provides verbal and written material to individual with discussion of infection control including proper hand washing and proper equipment cleaning during exercise session.   Cardiac Rehab from 01/17/2019 in [facility] Cardiac and Pulmonary Rehab  Date  01/04/19  Educator  [facility]  Instruction Review Code  1- Verbalizes Understanding      Falls Prevention: - Provides verbal and written material to individual with discussion of falls prevention and safety.   Cardiac Rehab from 01/17/2019 in [facility] Cardiac and Pulmonary Rehab  Date  01/04/19  Educator  [facility]  Instruction Review Code  1- Verbalizes Understanding      Diabetes: - Individual verbal and written instruction to review  signs/symptoms of diabetes, desired ranges of glucose level fasting, after meals and with exercise. Acknowledge that pre and post exercise glucose checks will be done for 3 sessions at entry of program.   Know Your Numbers and Risk Factors: -Group verbal and written instruction about important numbers in your health.  Discussion of what are risk factors and how they play a role in the disease process.  Review of Cholesterol, Blood Pressure, Diabetes, and BMI and the role they play in your overall health.   Cardiac Rehab from 01/17/2019 in [facility] Cardiac and Pulmonary Rehab  Date  01/15/19  Educator  SB  Instruction Review Code  1- Verbalizes Understanding      Sleep Hygiene: -Provides group verbal and written instruction about how sleep can affect your health.  Define sleep hygiene, discuss sleep cycles and impact of sleep habits. Review good sleep hygiene tips.    Other: -Provides group and verbal instruction on various topics (see comments)   Knowledge Questionnaire Score: Knowledge Questionnaire Score - 01/04/19 1350      Knowledge Questionnaire Score   Pre Score  26/26       Core Components/Risk Factors/Patient Goals at Admission: Personal Goals and Risk Factors at Admission - 01/04/19 1349      Core Components/Risk Factors/Patient Goals on Admission    Weight Management  Yes;Weight Loss    Intervention  Weight Management: Develop a combined nutrition and exercise program designed to reach desired caloric intake, while maintaining appropriate intake of nutrient and fiber, sodium and fats, and appropriate energy expenditure required for the weight goal.;Weight Management: Provide education and appropriate resources to help participant work on and attain dietary goals.;Weight Management/Obesity: Establish reasonable short term and long term weight goals.    Admit Weight  164 lb 6.4 oz (74.6 kg)   wants to gain  muscle back as well as lose weight   Expected Outcomes  Short Term:  Continue to assess and modify interventions until short term weight is achieved;Long Term: Adherence to nutrition and physical activity/exercise program aimed toward attainment of established weight goal;Weight Loss: Understanding of general recommendations for a balanced deficit meal plan, which promotes 1-2 lb weight loss per week and includes a negative energy balance of [phone] kcal/d;Understanding recommendations for meals to include 15-35% energy as protein, 25-35% energy from fat, 35-60% energy from carbohydrates, less than 255m of dietary cholesterol, 20-35 gm of total fiber daily;Understanding of distribution of calorie intake throughout the day with the consumption of 4-5 meals/snacks    Lipids  Yes    Intervention  Provide education and support for participant on nutrition & aerobic/resistive exercise along with prescribed medications to achieve LDL <775m HDL >40106m   Expected Outcomes  Short Term: Participant states understanding of desired cholesterol values and is compliant with medications prescribed. Participant is following exercise prescription and nutrition guidelines.;Long Term: Cholesterol controlled with medications as prescribed, with individualized exercise RX and with personalized nutrition plan. Value goals: LDL < 50m73mDL > 40 mg.       Core Components/Risk Factors/Patient Goals Review:  Goals and Risk Factor Review    Row Name 01/19/19 1023 02/12/19 1138 03/01/19 1040 04/16/19 1046       Core Components/Risk Factors/Patient Goals Review   Personal Goals Review  Weight Management/Obesity;Lipids;Hypertension  Weight Management/Obesity;Lipids;Hypertension  Weight Management/Obesity;Lipids;Hypertension  Weight Management/Obesity;Lipids;Hypertension    Review  DebbJackelyn Polingdoing well in rehab. Her weight is starting to come down.  Her pressures have been good in class and she even bought a blood pressure to take with her on vacation and to start using at home.  She is feeling  good with her medications as well.    DebbJackelyn Wood still been able to lose some weight.  She enjoyed talking with the dietician and is eating better now than before her event.  Her blood pressures have been good, but she thinks that she may need to get a new cuff.   DebbJackelyn Wood been doing well with her weight.  She continues to exercise to help.  She also continues to eat better.  She continues to check her pressures and they have been good.     DebbJackelyn Wood been doing well.  Her weight has been good and staying stable.  She continues to have good blood pressures readings.  She is planning to down load our app after her trip to the beach.     Expected Outcomes  Short: Continue to work on weight loss.  Long: Continue to monitor her risk factors.   Short: Get new BP cuff.  Long: Continue to work weight loss.   Short: Continue to work on weight loss.  Long: Continue to monitor risk factors.   Short: Continue to work on weight loss.  Long: Continueto monitor risk factors.        Core Components/Risk Factors/Patient Goals at Discharge (Final Review):  Goals and Risk Factor Review - 04/16/19 1046      Core Components/Risk Factors/Patient Goals Review   Personal Goals Review  Weight Management/Obesity;Lipids;Hypertension    Review  DebbJackelyn Wood been doing well.  Her weight has been good and staying stable.  She continues to have good blood pressures readings.  She is planning to down load our app after her trip to the beach.     Expected Outcomes  Short: Continue to  work on weight loss.  Long: Continueto monitor risk factors.        ITP Comments: ITP Comments    Row Name 01/04/19 1337 01/10/19 0550 01/19/19 1012 01/30/19 1416 02/07/19 1206   ITP Comments  Med Review completed. Initial ITP created. Diagnosis can be found in [facility] 1/28  30 day review. Continue with ITP unless directed changes by Medical Director chart review.  Kathy Wood will be out of town on vacation for the next three weeks.    Our program is  currently closed due to COVID-19.  We are communicating with patient via phone calls and emails.    30 day review. Continue with ITP unless directed changes by Medical Director chart review.   [place] Name 05/30/19 1329 06/15/19 1108         ITP Comments  30 day review cycle restarting  after being closed since March 16 because of  Covid 19 pandemic. Program opened to patients on July 6. Not all have returned. ITP updated and sent to Medical Director for review,changes as needed and signature  Discharged  today per patient request.         Comments: Discharged today

## 2019-06-15 NOTE — Progress Notes (Signed)
Cardiac Individual Treatment Plan  Patient Details  Name: Kathy Wood MRN: [id number] Date of Birth: 16-Sep-1951 Referring Provider:     Cardiac Rehab from 01/04/2019 in [facility] Cardiac and Pulmonary Rehab  Referring Provider  Hande      Initial Encounter Date:    Cardiac Rehab from 01/04/2019 in [facility] Cardiac and Pulmonary Rehab  Date  01/04/19      Visit Diagnosis: S/P AVR (aortic valve replacement)   Patient's Home Medications on Admission:  Current Outpatient Medications:  .  alendronate (FOSAMAX) 70 MG tablet, Take 70 mg by mouth once a week., Disp: , Rfl:  .  aspirin 81 MG chewable tablet, Chew by mouth., Disp: , Rfl:  .  calcium carbonate (OS-CAL) 600 MG TABS tablet, Take 600 mg by mouth daily., Disp: , Rfl:  .  Cholecalciferol (VITAMIN D3) 25 MCG (1000 UT) CAPS, Take by mouth., Disp: , Rfl:  .  levothyroxine (SYNTHROID, LEVOTHROID) 75 MCG tablet, , Disp: , Rfl:  .  metoprolol tartrate (LOPRESSOR) 25 MG tablet, Take 12.5 mg by mouth 2 (two) times daily., Disp: , Rfl:  .  Multiple Vitamin (MULTIVITAMIN) tablet, Take 1 tablet by mouth daily., Disp: , Rfl:  .  pravastatin (PRAVACHOL) 40 MG tablet, Take 40 mg by mouth daily., Disp: , Rfl:   Past Medical History: Past Medical History:  Diagnosis Date  . Aortic valvar stenosis    Valve replacement. [facility]. 12/13/18  . 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 Cancer of left breast ([place]) 2009  . Hyperlipidemia   . Hypothyroidism   . Osteoporosis   . Personal history of chemotherapy 2009  . Personal history of radiation therapy 2006  . 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 Medtronic Azure Xt Dr Mcarthur Rossetti. [id number]    Tobacco Use: Social History   Tobacco Use  Smoking Status Never Smoker  Smokeless Tobacco Never Used    Labs: Recent Review Flowsheet Data    There is no flowsheet data to display.       Exercise Target  Goals: Exercise Program Goal: Individual exercise prescription set using results from initial 6 min walk test and THRR while considering  patient's activity barriers and safety.   Exercise Prescription Goal: Initial exercise prescription builds to 30-45 minutes a day of aerobic activity, 2-3 days per week.  Home exercise guidelines will be given to patient during program as part of exercise prescription that the participant will acknowledge.  Activity Barriers & Risk Stratification: Activity Barriers & Cardiac Risk Stratification - 01/04/19 1358      Activity Barriers & Cardiac Risk Stratification   Activity Barriers  None    Cardiac Risk Stratification  Low       6 Minute Walk: 6 Minute Walk    Row Name 01/04/19 1423         6 Minute Walk   Phase  Initial     Distance  1400 feet     Walk Time  6 minutes     # of Rest Breaks  0     MPH  2.65     METS  3.6     RPE  11     Perceived Dyspnea   1     VO2 Peak  12.6     Symptoms  No     Resting HR  118 bpm     Resting BP  126/82     Exercise Oxygen Saturation  during 6 min walk  98 %     Max Ex. HR  128 bpm     Max Ex. BP  136/78     2 Minute Post BP  124/76        Oxygen Initial Assessment:   Oxygen Re-Evaluation:   Oxygen Discharge (Final Oxygen Re-Evaluation):   Initial Exercise Prescription: Initial Exercise Prescription - 01/04/19 1400      Date of Initial Exercise RX and Referring Provider   Date  01/04/19    Referring Provider  Hande      Treadmill   MPH  2.6    Grade  1    Minutes  15    METs  3.35      Recumbant Bike   Level  4    RPM  60    Watts  38    Minutes  15    METs  3.6      Elliptical   Level  1    Speed  3    Minutes  15      REL-XR   Level  4    Speed  50    Minutes  15    METs  3.6      Prescription Details   Frequency (times per week)  3    Duration  Progress to 45 minutes of aerobic exercise without signs/symptoms of physical distress      Intensity   THRR  40-80% of Max Heartrate  132-148    Ratings of Perceived Exertion  11-15    Perceived Dyspnea  0-4      Progression   Progression  Continue to progress workloads to maintain intensity without signs/symptoms of physical distress.      Resistance Training   Training Prescription  Yes    Weight  3 lb   no overhead yet   Reps  10-15       Perform Capillary Blood Glucose checks as needed.  Exercise Prescription Changes:  Exercise Prescription Changes    Row Name 01/04/19 1400 01/16/19 1600 01/17/19 0900         Response to Exercise   Blood Pressure (Admit)  126/82  124/70  -     Blood Pressure (Exercise)  136/78  156/72  -     Blood Pressure (Exit)  124/76  102/62  -     Heart Rate (Admit)  118 bpm  114 bpm  -     Heart Rate (Exercise)  128 bpm  130 bpm  -     Heart Rate (Exit)  123 bpm  86 bpm  -     Oxygen Saturation (Exit)  98 %  -  -     Rating of Perceived Exertion (Exercise)  11  17  -     Symptoms  -  none  -     Duration  -  Continue with 45 min of aerobic exercise without signs/symptoms of physical distress.  -     Intensity  -  THRR unchanged  -       Progression   Progression  -  Continue to progress workloads to maintain intensity without signs/symptoms of physical distress.  -     Average METs  -  3.48  -       Resistance Training   Training Prescription  -  Yes  -     Weight  -  3  lbs  -     Reps  -  10-15  -       Interval Training   Interval Training  -  No  -       Treadmill   MPH  -  2.6  -     Grade  -  1  -     Minutes  -  15  -     METs  -  3.35  -       Recumbant Bike   Level  -  3  -     Minutes  -  15  -     METs  -  3.6  -       Elliptical   Level  -  1  -     Speed  -  3  -     Minutes  -  15  -       Home Exercise Plan   Plans to continue exercise at  -  -  Home (comment) walking, country club     Frequency  -  -  Add 2 additional days to program exercise sessions.     Initial Home Exercises Provided  -  -  01/17/19         Exercise Comments:  Exercise Comments    Row Name 01/08/19 1610 01/17/19 0908         Exercise Comments   First full day of exercise!  Patient was oriented to gym and equipment including functions, settings, policies, and procedures.  Patient's individual exercise prescription and treatment plan were reviewed.  All starting workloads were established based on the results of the 6 minute walk test done at initial orientation visit.  The plan for exercise progression was also introduced and progression will be customized based on patient's performance and goals.  Kathy Wood will be going on vacation for three weeks starting next week.  When she returns, she may only stay for a week before changing to [employer] due to her insurance copay.          Exercise Goals and Review:  Exercise Goals    Row Name 01/04/19 1422             Exercise Goals   Increase Physical Activity  Yes       Intervention  Provide advice, education, support and counseling about physical activity/exercise needs.;Develop an individualized exercise prescription for aerobic and resistive training based on initial evaluation findings, risk stratification, comorbidities and participant's personal goals.       Expected Outcomes  Short Term: Attend rehab on a regular basis to increase amount of physical activity.;Long Term: Add in home exercise to make exercise part of routine and to increase amount of physical activity.;Long Term: Exercising regularly at least 3-5 days a week.       Increase Strength and Stamina  Yes       Intervention  Provide advice, education, support and counseling about physical activity/exercise needs.;Develop an individualized exercise prescription for aerobic and resistive training based on initial evaluation findings, risk stratification, comorbidities and participant's personal goals.       Expected Outcomes  Short Term: Increase workloads from initial exercise prescription for resistance, speed,  and METs.;Short Term: Perform resistance training exercises routinely during rehab and add in resistance training at home;Long Term: Improve cardiorespiratory fitness, muscular endurance and strength as measured by increased METs and functional capacity (6MWT)       Able  to understand and use rate of perceived exertion (RPE) scale  Yes       Intervention  Provide education and explanation on how to use RPE scale       Expected Outcomes  Short Term: Able to use RPE daily in rehab to express subjective intensity level;Long Term:  Able to use RPE to guide intensity level when exercising independently       Knowledge and understanding of Target Heart Rate Range (THRR)  Yes       Intervention  Provide education and explanation of THRR including how the numbers were predicted and where they are located for reference       Expected Outcomes  Short Term: Able to state/look up THRR;Short Term: Able to use daily as guideline for intensity in rehab;Long Term: Able to use THRR to govern intensity when exercising independently       Able to check pulse independently  Yes       Intervention  Provide education and demonstration on how to check pulse in carotid and radial arteries.;Review the importance of being able to check your own pulse for safety during independent exercise       Expected Outcomes  Short Term: Able to explain why pulse checking is important during independent exercise;Long Term: Able to check pulse independently and accurately       Understanding of Exercise Prescription  Yes       Intervention  Provide education, explanation, and written materials on patient's individual exercise prescription       Expected Outcomes  Short Term: Able to explain program exercise prescription;Long Term: Able to explain home exercise prescription to exercise independently          Exercise Goals Re-Evaluation : Exercise Goals Re-Evaluation    Row Name 01/08/19 0752 01/16/19 1604 01/17/19 0908 01/19/19 1012  01/30/19 1417     Exercise Goal Re-Evaluation   Exercise Goals Review  Increase Physical Activity;Increase Strength and Stamina;Able to understand and use rate of perceived exertion (RPE) scale;Knowledge and understanding of Target Heart Rate Range (THRR);Understanding of Exercise Prescription  Increase Physical Activity;Increase Strength and Stamina;Understanding of Exercise Prescription  Increase Physical Activity;Increase Strength and Stamina;Understanding of Exercise Prescription;Able to understand and use rate of perceived exertion (RPE) scale;Knowledge and understanding of Target Heart Rate Range (THRR);Able to check pulse independently  Increase Physical Activity;Increase Strength and Stamina;Understanding of Exercise Prescription  -   Comments  Reviewed RPE scale, THR and program prescription with pt today.  Pt voiced understanding and was given a copy of goals to take home.   Kathy Wood is off to a good start in rehab.  She is already up to 3.6 METs on the recumbent bike.  We will continue to monitor her progress.   Reviewed home exercise with pt today.  Pt plans to walk and use machines for exercise.  She is also a member of the [employer].  Reviewed THR, pulse, RPE, sign and symptoms, NTG use, and when to call 911 or MD.  Also discussed weather considerations and indoor options.  Pt voiced understanding.  Kathy Wood has been doing well with rehab.  She feels better about where she is now as she was terrified to exercise before coming into program after her event.  Now she is already much more confident and ready to get back to her routine.  She will be walking while out on vacation and using some machines at the hotel.   She feels that her stamina is back and  still climbing.   Out on vacation since last review   Expected Outcomes  Short: Use RPE daily to regulate intensity. Long: Follow program prescription in THR.  Short: Continue to attend regularly  Long: Continue to improve strength and stamina.    Short: Walk while out on vacation.  Long: Continue to improve strength and stamina.   Short: Walk while out on vacation.  Long: Continue to improve strength and stamina.   -   Row Name 02/12/19 1131 03/01/19 1033 03/23/19 1010 04/16/19 1040 05/09/19 1134     Exercise Goal Re-Evaluation   Exercise Goals Review  Increase Physical Activity;Increase Strength and Stamina;Understanding of Exercise Prescription  Increase Physical Activity;Increase Strength and Stamina;Understanding of Exercise Prescription  Increase Physical Activity;Increase Strength and Stamina;Understanding of Exercise Prescription  -  Increase Physical Activity;Increase Strength and Stamina;Understanding of Exercise Prescription   Comments  Kathy Wood has been doing well.  She enjoyed her vacation to [place].  She was able to stay active while down there. Since getting back she has been using our videos and stuff from [place] to stay acitve.  She is feeling much better and more confident.  She would like to come back for one day once we get back to finalize everything and then graduation.   Kathy Wood has been doing well at home. She continues to get and walk regularly.  She has also been using the videos from [employer] and from the class [employer] has set up on [place].  She is enjoying all the videos and the antics of [employer] animals.  She has also used our videos for workouts several times.  She is feeling good and still planning to finsih quickly once we are able to return.   Kathy Wood is doing well.  She continues to walk daily and feels that she is getting faster!!  She also continues to use our videos and those from [employer] class.  She is eager to get back to officially graduate!  Kathy Wood has been doing well with exercise.  She continues to walk daily and use videos for variety. She is headed to the beach for the week and looking forward to walking on the beach.   She is planning to down load the new app once she returns.   Kathy Wood is doing  well.  She is at the beach again this week.  She is walking daily and doing some videos.  She would like to return to rehab.  She has several appts coming up that need to be schedule before getting back on our schedule.   Expected Outcomes  Short: Continue to exercise. Long: Continue to increase activity levels.   Short: Continue to use videos for exercise and walk daily.  Long: Continue to rebuild strength and stamina.   Short: Continue walk and use videos.  Long: Continue to build stamina  Short: Continue walk and use videos.  Long: Continue to build stamina  Short: Get rescheduled with [place] to graduate.  Long: Continue to exercise regularly.      Discharge Exercise Prescription (Final Exercise Prescription Changes): Exercise Prescription Changes - 01/17/19 0900      Home Exercise Plan   Plans to continue exercise at  Home (comment)   walking, country club   Frequency  Add 2 additional days to program exercise sessions.    Initial Home Exercises Provided  01/17/19       Nutrition:  Target Goals: Understanding of nutrition guidelines, daily intake of sodium <1520m, cholesterol <2062m calories 30%  from fat and 7% or less from saturated fats, daily to have 5 or more servings of fruits and vegetables.  Biometrics: Pre Biometrics - 01/04/19 1422      Pre Biometrics   Height  5' 4.5" (1.638 m)    Weight  164 lb 6.4 oz (74.6 kg)    Waist Circumference  35.5 inches    Hip Circumference  41 inches    Waist to Hip Ratio  0.87 %    BMI (Calculated)  27.79    Single Leg Stand  30 seconds        Nutrition Therapy Plan and Nutrition Goals: Nutrition Therapy & Goals - 02/07/19 1154      Nutrition Therapy   Diet  Healthy heart, low sodium 1500-1600kcal (BMI 27.79)(wst: 35.5 inches)    Protein (specify units)  65-75g    Fiber  25 grams    Whole Grain Foods  3 servings    Saturated Fats  12 max. grams    Fruits and Vegetables  5 servings/day    Sodium  1.5 grams      Personal Nutrition  Goals   Nutrition Goal  ST: increase exercise with current health crisis (COVID 19). LT: continue with HH diet    Comments  Pt reports gaining her appetite back, but her baseline is now lower than before the surgery; discussed her energy needs for normal functioning. B: yogurt w/ egg + black tea (mentioned iron and black tea - to just be mindful). L half soup or half sandwhich. D: lean meat with vegetable. Pt reports losing weight, discussed healthy weight loss and calroie needs as well as muscle mass.       Intervention Plan   Intervention  Prescribe, educate and counsel regarding individualized specific dietary modifications aiming towards targeted core components such as weight, hypertension, lipid management, diabetes, heart failure and other comorbidities.    Expected Outcomes  Short Term Goal: Understand basic principles of dietary content, such as calories, fat, sodium, cholesterol and nutrients.;Short Term Goal: A plan has been developed with personal nutrition goals set during dietitian appointment.;Long Term Goal: Adherence to prescribed nutrition plan.       Nutrition Assessments: Nutrition Assessments - 01/04/19 1355      MEDFICTS Scores   Pre Score  31       Nutrition Goals Re-Evaluation: Nutrition Goals Re-Evaluation    [place] Name 01/19/19 1020 04/13/19 1204           Goals   Current Weight  -  157 lb (71.2 kg)      Nutrition Goal  Heart Healthy, Increase fluids, watch sodium   ST/LT: continue [place] eating and exercise, "I'm back to normal, I don't really have anymore goals"      Comment  Kathy Wood has been doing well with her diet.  Her appetite is still not back to where she wants it to be yet.  However, her weight is coming down and she is trying to make good choices.  She is getting in fruits and vegetables.  She is also trying to watch her sodium intake.  She has cut out sugared beverages and drinks mostly water.  One thing she has noted that is weird for her is that she is no  longer craving sweets and especially not chocolate when that used to be one of her favorites.   Appetite is back! B: smoothies in morning, scrambled egg or english muffin or yogurt (will eat more) and fresh fruit. L: half sandwich -  turkey/chicken/ham lunch meat, pickles, half orange. Drinks water throughout the day and some tea in AM. D: piece of meat (chicken breat/thighs, meatballs, etc) and salad      Expected Outcome  Short: Continue to eat healthier and gain appetite.  Long: Continue to follow heart healthy diet.   Continue to follow heart healthy diet.          Nutrition Goals Discharge (Final Nutrition Goals Re-Evaluation): Nutrition Goals Re-Evaluation - 04/13/19 1204      Goals   Current Weight  157 lb (71.2 kg)    Nutrition Goal  ST/LT: continue HH eating and exercise, "I'm back to normal, I don't really have anymore goals"    Comment  Appetite is back! B: smoothies in morning, scrambled egg or english muffin or yogurt (will eat more) and fresh fruit. L: half sandwich - turkey/chicken/ham lunch meat, pickles, half orange. Drinks water throughout the day and some tea in AM. D: piece of meat (chicken breat/thighs, meatballs, etc) and salad    Expected Outcome  Continue to follow heart healthy diet.        Psychosocial: Target Goals: Acknowledge presence or absence of significant depression and/or stress, maximize coping skills, provide positive support system. Participant is able to verbalize types and ability to use techniques and skills needed for reducing stress and depression.   Initial Review & Psychosocial Screening: Initial Psych Review & Screening - 01/04/19 1350      Initial Review   Current issues with  Current Stress Concerns    Source of Stress Concerns  Unable to participate in former interests or hobbies    Comments  Kathy Wood's shortness of breath has become an issue after her valve replacement. She has lived a very active lifestyle and is looking to get back to that.  She knows it will take time and is very motivated to get going.       Family Dynamics   Good Support System?  Yes   spouse     Barriers   Psychosocial barriers to participate in program  There are no identifiable barriers or psychosocial needs.;The patient should benefit from training in stress management and relaxation.      Screening Interventions   Interventions  Encouraged to exercise;Program counselor consult;Provide feedback about the scores to participant;To provide support and resources with identified psychosocial needs    Expected Outcomes  Short Term goal: Utilizing psychosocial counselor, staff and physician to assist with identification of specific Stressors or current issues interfering with healing process. Setting desired goal for each stressor or current issue identified.;Long Term Goal: Stressors or current issues are controlled or eliminated.;Short Term goal: Identification and review with participant of any Quality of Life or Depression concerns found by scoring the questionnaire.;Long Term goal: The participant improves quality of Life and PHQ9 Scores as seen by post scores and/or verbalization of changes       Quality of Life Scores:  Quality of Life - 01/04/19 1355      Quality of Life   Select  Quality of Life      Quality of Life Scores   Health/Function Pre  25.9 %    Socioeconomic Pre  30 %    Psych/Spiritual Pre  30 %    Family Pre  30 %    GLOBAL Pre  28.14 %      Scores of 19 and below usually indicate a poorer quality of life in these areas.  A difference of  2-3 points is a  clinically meaningful difference.  A difference of 2-3 points in the total score of the Quality of Life Index has been associated with significant improvement in overall quality of life, self-image, physical symptoms, and general health in studies assessing change in quality of life.  PHQ-9: Recent Review Flowsheet Data    Depression screen [facility] 2/9 01/04/2019   Decreased Interest 0    Down, Depressed, Hopeless 0   PHQ - 2 Score 0   Altered sleeping 0   Tired, decreased energy 1   Change in appetite 1   Feeling bad or failure about yourself  0   Trouble concentrating 0   Moving slowly or fidgety/restless 0   PHQ-9 Score 2   Difficult doing work/chores Not difficult at all     Interpretation of Total Score  Total Score Depression Severity:  1-4 = Minimal depression, 5-9 = Mild depression, 10-14 = Moderate depression, 15-19 = Moderately severe depression, 20-27 = Severe depression   Psychosocial Evaluation and Intervention: Psychosocial Evaluation - 01/08/19 0910      Psychosocial Evaluation & Interventions   Interventions  Encouraged to exercise with the program and follow exercise prescription;Stress management education    Comments  Counselor met with Ms. Kerchner Kathy Wood) today for initial psychosocial evaluation.  She is 68 year old who had a aortic valve replacement and pacemaker inserted (3) weeks ago.  She has a strong support system with a spouse of 63 years; (3) adult children and active involvement in her local church.  Kathy Wood is cancer free currently after having breast cancer twice and a mastectomy of her left breast.  She also struggles with hypothyroidism and takes medication for that.  She reports sleeping well and her appetite has not returned yet since the surgery. She denies a history of depression or anxiety or any current symptoms and is typically in a positive mood most of the time.  Her primary stressors are her health and being the caregiver for her 75 year old mother in law who lives in [place] assisted living.  Kathy Wood has goals to increase her energy and recognize her exercise limits while in this program.  She will be followed by staff.      Expected Outcomes  Short:  Kathy Wood will exercise consistently to re-gain her energy levels and determine her limits in her exercise regimen.  She will also exercise for stress reduction.  Long:  Kathy Wood will  develop a routine of exercise for her health and to manage her stress positively.     Continue Psychosocial Services   Follow up required by staff       Psychosocial Re-Evaluation: Psychosocial Re-Evaluation    [place] Name 01/19/19 1017 02/12/19 1136 03/01/19 1035 03/23/19 1014 04/16/19 1044     Psychosocial Re-Evaluation   Current issues with  Current Stress Concerns  Current Stress Concerns  Current Stress Concerns  Current Stress Concerns  Current Stress Concerns   Comments  Kathy Wood is doing well mentally. Her major stressors continue to be her health and her caregiver responsibilites.  In generally, things are going well and improving.  She has more confidence with exercise now.  She is looking forward to her vacation!!  When she gets back, she may only do the program for another week before switching to [employer] mode due to insurance copays.  Kathy Wood enjoyed her vacation.  She was able to get out to do most things.  She has been tolerating the stresses of the current situation well.  She continues  to exercise to help her deal with everything.    Kathy Wood is doing well at home.  She is able to get to walk with a friend daily. She has been using FaceTime and Zoom to connect with friends and family.  She is also using her exercise to help with her stress.  She is having a little difficulty with a recurring UTI, but her doctor called in an antibiotic for her and she is feeling better again.  She mentioned that 54 yo mother in law is having a hard with face masking as she can't read lips anymore so the facetime is a great way for them to both get a good connection.   Kathy Wood is holding up well and staying connected. She is eager to get back to normal.   Kathy Wood is doing well at home.  She continues to stay connected with videos and zoom meetings.  She is headed to the beach for the week and looking forward to a chance to unplug some.   Expected Outcomes  Short: Enjoy vacation.  Long: Continue to stay positive!   Short: Continue to exercise.  Long: Continue to stay positive!  Short: Continue to exercise for mood boost and connections.  Long: Continue to connect with friends and family virtually.   Short: Continue to connect.  Long: Continue to exercise for moof boost.   Short: Enjoy vacation.  Long: Continue to stay connected and exercise   Interventions  Encouraged to attend Cardiac Rehabilitation for the exercise;Stress management education  -  Encouraged to attend Cardiac Rehabilitation for the exercise  Encouraged to attend Cardiac Rehabilitation for the exercise  -   Continue Psychosocial Services   Follow up required by staff  -  Follow up required by staff  Follow up required by staff  Follow up required by staff   [place] Name 05/09/19 1135             Psychosocial Re-Evaluation   Current issues with  Current Stress Concerns       Comments  Kathy Wood is doing well at home. She is at the beach this week and waiting to hear from doctors to schedule her cataract surgery.  Once she knows her schedule she is eager to return to class to graduate!       Expected Outcomes  Short: Enjoy the beach and schedule surgery.  Long: Continue to stay positive.       Interventions  Encouraged to attend Cardiac Rehabilitation for the exercise       Continue Psychosocial Services   Follow up required by staff          Psychosocial Discharge (Final Psychosocial Re-Evaluation): Psychosocial Re-Evaluation - 05/09/19 1135      Psychosocial Re-Evaluation   Current issues with  Current Stress Concerns    Comments  Kathy Wood is doing well at home. She is at the beach this week and waiting to hear from doctors to schedule her cataract surgery.  Once she knows her schedule she is eager to return to class to graduate!    Expected Outcomes  Short: Enjoy the beach and schedule surgery.  Long: Continue to stay positive.    Interventions  Encouraged to attend Cardiac Rehabilitation for the exercise    Continue Psychosocial Services    Follow up required by staff       Vocational Rehabilitation: Provide vocational rehab assistance to qualifying candidates.   Vocational Rehab Evaluation & Intervention: Vocational Rehab - 01/04/19 1350  Initial Vocational Rehab Evaluation & Intervention   Assessment shows need for Vocational Rehabilitation  No       Education: Education Goals: Education classes will be provided on a variety of topics geared toward better understanding of heart health and risk factor modification. Participant will state understanding/return demonstration of topics presented as noted by education test scores.  Learning Barriers/Preferences: Learning Barriers/Preferences - 01/04/19 1350      Learning Barriers/Preferences   Learning Barriers  None    Learning Preferences  None       Education Topics:  AED/CPR: - Group verbal and written instruction with the use of models to demonstrate the basic use of the AED with the basic ABC's of resuscitation.   Cardiac Rehab from 01/17/2019 in [facility] Cardiac and Pulmonary Rehab  Date  01/08/19  Educator  SB  Instruction Review Code  1- Verbalizes Understanding      General Nutrition Guidelines/Fats and Fiber: -Group instruction provided by verbal, written material, models and posters to present the general guidelines for heart healthy nutrition. Gives an explanation and review of dietary fats and fiber.   Controlling Sodium/Reading Food Labels: -Group verbal and written material supporting the discussion of sodium use in heart healthy nutrition. Review and explanation with models, verbal and written materials for utilization of the food label.   Exercise Physiology & General Exercise Guidelines: - Group verbal and written instruction with models to review the exercise physiology of the cardiovascular system and associated critical values. Provides general exercise guidelines with specific guidelines to those with heart or lung disease.    Aerobic  Exercise & Resistance Training: - Gives group verbal and written instruction on the various components of exercise. Focuses on aerobic and resistive training programs and the benefits of this training and how to safely progress through these programs..   Flexibility, Balance, Mind/Body Relaxation: Provides group verbal/written instruction on the benefits of flexibility and balance training, including mind/body exercise modes such as yoga, pilates and tai chi.  Demonstration and skill practice provided.   Stress and Anxiety: - Provides group verbal and written instruction about the health risks of elevated stress and causes of high stress.  Discuss the correlation between heart/lung disease and anxiety and treatment options. Review healthy ways to manage with stress and anxiety.   Depression: - Provides group verbal and written instruction on the correlation between heart/lung disease and depressed mood, treatment options, and the stigmas associated with seeking treatment.   Cardiac Rehab from 01/17/2019 in [facility] Cardiac and Pulmonary Rehab  Date  01/17/19  Educator  KG  Instruction Review Code  1- Verbalizes Understanding      Anatomy & Physiology of the Heart: - Group verbal and written instruction and models provide basic cardiac anatomy and physiology, with the coronary electrical and arterial systems. Review of Valvular disease and Heart Failure   Cardiac Procedures: - Group verbal and written instruction to review commonly prescribed medications for heart disease. Reviews the medication, class of the drug, and side effects. Includes the steps to properly store meds and maintain the prescription regimen. (beta blockers and nitrates)   Cardiac Medications I: - Group verbal and written instruction to review commonly prescribed medications for heart disease. Reviews the medication, class of the drug, and side effects. Includes the steps to properly store meds and maintain the prescription  regimen.   Cardiac Medications II: -Group verbal and written instruction to review commonly prescribed medications for heart disease. Reviews the medication, class of the drug, and side  effects. (all other drug classes)   Cardiac Rehab from 01/17/2019 in [facility] Cardiac and Pulmonary Rehab  Date  01/15/19  Educator  SB  Instruction Review Code  1- Verbalizes Understanding       Go Sex-Intimacy & Heart Disease, Get SMART - Goal Setting: - Group verbal and written instruction through game format to discuss heart disease and the return to sexual intimacy. Provides group verbal and written material to discuss and apply goal setting through the application of the S.M.A.R.T. Method.   Other Matters of the Heart: - Provides group verbal, written materials and models to describe Stable Angina and Peripheral Artery. Includes description of the disease process and treatment options available to the cardiac patient.   Exercise & Equipment Safety: - Individual verbal instruction and demonstration of equipment use and safety with use of the equipment.   Cardiac Rehab from 01/17/2019 in [facility] Cardiac and Pulmonary Rehab  Date  01/04/19  Educator  [facility]  Instruction Review Code  1- Verbalizes Understanding      Infection Prevention: - Provides verbal and written material to individual with discussion of infection control including proper hand washing and proper equipment cleaning during exercise session.   Cardiac Rehab from 01/17/2019 in [facility] Cardiac and Pulmonary Rehab  Date  01/04/19  Educator  [facility]  Instruction Review Code  1- Verbalizes Understanding      Falls Prevention: - Provides verbal and written material to individual with discussion of falls prevention and safety.   Cardiac Rehab from 01/17/2019 in [facility] Cardiac and Pulmonary Rehab  Date  01/04/19  Educator  [facility]  Instruction Review Code  1- Verbalizes Understanding      Diabetes: - Individual verbal and written instruction to review  signs/symptoms of diabetes, desired ranges of glucose level fasting, after meals and with exercise. Acknowledge that pre and post exercise glucose checks will be done for 3 sessions at entry of program.   Know Your Numbers and Risk Factors: -Group verbal and written instruction about important numbers in your health.  Discussion of what are risk factors and how they play a role in the disease process.  Review of Cholesterol, Blood Pressure, Diabetes, and BMI and the role they play in your overall health.   Cardiac Rehab from 01/17/2019 in [facility] Cardiac and Pulmonary Rehab  Date  01/15/19  Educator  SB  Instruction Review Code  1- Verbalizes Understanding      Sleep Hygiene: -Provides group verbal and written instruction about how sleep can affect your health.  Define sleep hygiene, discuss sleep cycles and impact of sleep habits. Review good sleep hygiene tips.    Other: -Provides group and verbal instruction on various topics (see comments)   Knowledge Questionnaire Score: Knowledge Questionnaire Score - 01/04/19 1350      Knowledge Questionnaire Score   Pre Score  26/26       Core Components/Risk Factors/Patient Goals at Admission: Personal Goals and Risk Factors at Admission - 01/04/19 1349      Core Components/Risk Factors/Patient Goals on Admission    Weight Management  Yes;Weight Loss    Intervention  Weight Management: Develop a combined nutrition and exercise program designed to reach desired caloric intake, while maintaining appropriate intake of nutrient and fiber, sodium and fats, and appropriate energy expenditure required for the weight goal.;Weight Management: Provide education and appropriate resources to help participant work on and attain dietary goals.;Weight Management/Obesity: Establish reasonable short term and long term weight goals.    Admit Weight  164  lb 6.4 oz (74.6 kg)   wants to gain muscle back as well as lose weight   Expected Outcomes  Short Term:  Continue to assess and modify interventions until short term weight is achieved;Long Term: Adherence to nutrition and physical activity/exercise program aimed toward attainment of established weight goal;Weight Loss: Understanding of general recommendations for a balanced deficit meal plan, which promotes 1-2 lb weight loss per week and includes a negative energy balance of [phone] kcal/d;Understanding recommendations for meals to include 15-35% energy as protein, 25-35% energy from fat, 35-60% energy from carbohydrates, less than 237m of dietary cholesterol, 20-35 gm of total fiber daily;Understanding of distribution of calorie intake throughout the day with the consumption of 4-5 meals/snacks    Lipids  Yes    Intervention  Provide education and support for participant on nutrition & aerobic/resistive exercise along with prescribed medications to achieve LDL <730m HDL >4075m   Expected Outcomes  Short Term: Participant states understanding of desired cholesterol values and is compliant with medications prescribed. Participant is following exercise prescription and nutrition guidelines.;Long Term: Cholesterol controlled with medications as prescribed, with individualized exercise RX and with personalized nutrition plan. Value goals: LDL < 14m55mDL > 40 mg.       Core Components/Risk Factors/Patient Goals Review:  Goals and Risk Factor Review    Row Name 01/19/19 1023 02/12/19 1138 03/01/19 1040 04/16/19 1046       Core Components/Risk Factors/Patient Goals Review   Personal Goals Review  Weight Management/Obesity;Lipids;Hypertension  Weight Management/Obesity;Lipids;Hypertension  Weight Management/Obesity;Lipids;Hypertension  Weight Management/Obesity;Lipids;Hypertension    Review  DebbJackelyn Polingdoing well in rehab. Her weight is starting to come down.  Her pressures have been good in class and she even bought a blood pressure to take with her on vacation and to start using at home.  She is feeling  good with her medications as well.    DebbJackelyn Wood still been able to lose some weight.  She enjoyed talking with the dietician and is eating better now than before her event.  Her blood pressures have been good, but she thinks that she may need to get a new cuff.   DebbJackelyn Wood been doing well with her weight.  She continues to exercise to help.  She also continues to eat better.  She continues to check her pressures and they have been good.     DebbJackelyn Wood been doing well.  Her weight has been good and staying stable.  She continues to have good blood pressures readings.  She is planning to down load our app after her trip to the beach.     Expected Outcomes  Short: Continue to work on weight loss.  Long: Continue to monitor her risk factors.   Short: Get new BP cuff.  Long: Continue to work weight loss.   Short: Continue to work on weight loss.  Long: Continue to monitor risk factors.   Short: Continue to work on weight loss.  Long: Continueto monitor risk factors.        Core Components/Risk Factors/Patient Goals at Discharge (Final Review):  Goals and Risk Factor Review - 04/16/19 1046      Core Components/Risk Factors/Patient Goals Review   Personal Goals Review  Weight Management/Obesity;Lipids;Hypertension    Review  DebbJackelyn Wood been doing well.  Her weight has been good and staying stable.  She continues to have good blood pressures readings.  She is planning to down load our app after her trip to the beach.  Expected Outcomes  Short: Continue to work on weight loss.  Long: Continueto monitor risk factors.        ITP Comments: ITP Comments    Row Name 01/04/19 1337 01/10/19 0550 01/19/19 1012 01/30/19 1416 02/07/19 1206   ITP Comments  Med Review completed. Initial ITP created. Diagnosis can be found in [facility] 1/28  30 day review. Continue with ITP unless directed changes by Medical Director chart review.  Kathy Wood will be out of town on vacation for the next three weeks.    Our program is  currently closed due to COVID-19.  We are communicating with patient via phone calls and emails.    30 day review. Continue with ITP unless directed changes by Medical Director chart review.   [place] Name 05/30/19 1329 06/15/19 1108         ITP Comments  30 day review cycle restarting  after being closed since March 16 because of  Covid 19 pandemic. Program opened to patients on July 6. Not all have returned. ITP updated and sent to Medical Director for review,changes as needed and signature  Discharged  today per patient request.         Comments: Discharge ITP

## 2019-09-04 ENCOUNTER — Other Ambulatory Visit: Payer: Self-pay | Admitting: Internal Medicine

## 2019-09-04 DIAGNOSIS — Z1231 Encounter for screening mammogram for malignant neoplasm of breast: Secondary | ICD-10-CM

## 2019-10-03 ENCOUNTER — Other Ambulatory Visit: Payer: Self-pay | Admitting: Internal Medicine

## 2019-10-03 DIAGNOSIS — Z1231 Encounter for screening mammogram for malignant neoplasm of breast: Secondary | ICD-10-CM

## 2019-10-03 DIAGNOSIS — N644 Mastodynia: Secondary | ICD-10-CM

## 2019-11-27 ENCOUNTER — Ambulatory Visit: Payer: Medicare HMO | Attending: Internal Medicine

## 2019-11-30 ENCOUNTER — Ambulatory Visit
Admission: RE | Admit: 2019-11-30 | Discharge: 2019-11-30 | Disposition: A | Discharge status: Home / Self Care | Payer: Medicare HMO | Source: Ambulatory Visit | Attending: Internal Medicine | Admitting: Internal Medicine

## 2019-11-30 DIAGNOSIS — N644 Mastodynia: Secondary | ICD-10-CM

## 2019-11-30 DIAGNOSIS — Z1231 Encounter for screening mammogram for malignant neoplasm of breast: Secondary | ICD-10-CM | POA: Insufficient documentation

## 2019-12-06 ENCOUNTER — Ambulatory Visit: Payer: Medicare HMO | Attending: Internal Medicine

## 2019-12-06 DIAGNOSIS — Z23 Encounter for immunization: Secondary | ICD-10-CM

## 2019-12-06 NOTE — Progress Notes (Signed)
   Covid-19 Vaccination Clinic  Name:  Kathy Wood    MRN: [id number] DOB: July 01, 1951  12/06/2019  [name] was observed post Covid-19 immunization for 30 minutes based on pre-vaccination screening without incidence. She was provided with Vaccine Information Sheet and instruction to access the V-Safe system.   [name] was instructed to call 911 with any severe reactions post vaccine: Marland Kitchen Difficulty breathing  . Swelling of your face and throat  . A fast heartbeat  . A bad rash all over your body  . Dizziness and weakness    Immunizations Administered    Name Date Dose VIS Date Route   Pfizer COVID-19 Vaccine 12/06/2019 10:09 AM 0.3 mL 10/26/2019 Intramuscular   Manufacturer: [place]   Lot: [id number]   [place]: [id number]

## 2019-12-27 ENCOUNTER — Ambulatory Visit: Payer: Medicare HMO | Attending: Internal Medicine

## 2019-12-27 DIAGNOSIS — Z23 Encounter for immunization: Secondary | ICD-10-CM | POA: Insufficient documentation

## 2019-12-27 NOTE — Progress Notes (Signed)
   Covid-19 Vaccination Clinic  Name:  Kathy Wood    MRN: [id number] DOB: 1951-08-17  12/27/2019  [name] was observed post Covid-19 immunization for 30 minutes based on pre-vaccination screening without incidence. She was provided with Vaccine Information Sheet and instruction to access the V-Safe system.   [name] was instructed to call 911 with any severe reactions post vaccine: Marland Kitchen Difficulty breathing  . Swelling of your face and throat  . A fast heartbeat  . A bad rash all over your body  . Dizziness and weakness    Immunizations Administered    Name Date Dose VIS Date Route   Pfizer COVID-19 Vaccine 12/27/2019 10:08 AM 0.3 mL 10/26/2019 Intramuscular   Manufacturer: [employer]   Lot: [id number]   [place]: [id number]

## 2020-10-02 ENCOUNTER — Ambulatory Visit: Payer: Medicare HMO | Admitting: Dermatology

## 2020-10-02 ENCOUNTER — Other Ambulatory Visit: Payer: Self-pay

## 2020-10-02 ENCOUNTER — Encounter: Payer: Self-pay | Admitting: Dermatology

## 2020-10-02 DIAGNOSIS — Z1283 Encounter for screening for malignant neoplasm of skin: Secondary | ICD-10-CM | POA: Diagnosis not present

## 2020-10-02 DIAGNOSIS — L820 Inflamed seborrheic keratosis: Secondary | ICD-10-CM

## 2020-10-02 DIAGNOSIS — L821 Other seborrheic keratosis: Secondary | ICD-10-CM | POA: Diagnosis not present

## 2020-10-02 DIAGNOSIS — L570 Actinic keratosis: Secondary | ICD-10-CM | POA: Diagnosis not present

## 2020-10-02 DIAGNOSIS — L578 Other skin changes due to chronic exposure to nonionizing radiation: Secondary | ICD-10-CM

## 2020-10-02 DIAGNOSIS — Z85828 Personal history of other malignant neoplasm of skin: Secondary | ICD-10-CM | POA: Diagnosis not present

## 2020-10-02 DIAGNOSIS — L814 Other melanin hyperpigmentation: Secondary | ICD-10-CM

## 2020-10-02 DIAGNOSIS — D1800 Hemangioma unspecified site: Secondary | ICD-10-CM

## 2020-10-02 DIAGNOSIS — D229 Melanocytic nevi, unspecified: Secondary | ICD-10-CM

## 2020-10-02 NOTE — Progress Notes (Signed)
Follow-Up Visit   Subjective  Kathy Wood is a 69 y.o. 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 The patient presents for Total-Body Skin Exam (TBSE) for skin cancer screening and mole check.  The following portions of the chart were reviewed this encounter and updated as appropriate:  Tobacco  Allergies  Meds  Problems  Med Hx  Surg Hx  Fam Hx     Review of Systems:  No other skin or systemic complaints except as noted in HPI or Assessment and Plan.  Objective  Well appearing patient in no apparent distress; mood and affect are within normal limits.  A full examination was performed including scalp, head, eyes, ears, nose, lips, neck, chest, axillae, abdomen, back, buttocks, bilateral upper extremities, bilateral lower extremities, hands, feet, fingers, toes, fingernails, and toenails. All findings within normal limits unless otherwise noted below.  Objective  Left Eyelid area, nasolabial: Well healed scar with no evidence of recurrence.   Objective  Left Ear x 1: Pink scaly macules   Objective  R chest x 1: Erythematous keratotic or waxy stuck-on papule or plaque.    Assessment & Plan    Lentigines - Scattered tan macules - Discussed due to sun exposure - Benign, observe - Call for any changes  Seborrheic Keratoses - Stuck-on, waxy, tan-brown papules and plaques  - Discussed benign etiology and prognosis. - Observe - Call for any changes  Melanocytic Nevi - Tan-brown and/or pink-flesh-colored symmetric macules and papules - Benign appearing on exam today - Observation - Call clinic for new or changing moles - Recommend daily use of broad spectrum spf 30+ sunscreen to sun-exposed areas.   Hemangiomas - Red papules - Discussed benign nature - Observe - Call for any  changes  Actinic Damage - Chronic, secondary to cumulative UV/sun exposure - diffuse scaly erythematous macules with underlying dyspigmentation - Recommend daily broad spectrum sunscreen SPF 30+ to sun-exposed areas, reapply every 2 hours as needed.  - Call for new or changing lesions.  Skin cancer screening performed today.  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 Observe for recurrence. Call clinic for new or changing lesions.  Recommend regular skin exams, daily broad-spectrum spf 30+ sunscreen use, and photoprotection.     AK (actinic keratosis) Left Ear x 1  Destruction of lesion - Left Ear x 1 Complexity: simple   Destruction method: cryotherapy   Informed consent: discussed and consent obtained   Timeout:  patient name, date of birth, surgical site, and procedure verified Lesion destroyed using liquid nitrogen: Yes   Region frozen until ice ball extended beyond lesion: Yes   Outcome: patient tolerated procedure well with no complications   Post-procedure details: wound care instructions given    Inflamed seborrheic keratosis R chest x 1  Destruction of lesion - R chest x 1 Complexity: simple   Destruction method: cryotherapy   Informed consent: discussed and consent obtained   Timeout:  patient name, date of birth, surgical site, and procedure verified Lesion destroyed using liquid nitrogen: Yes   Region frozen until ice ball extended beyond lesion: Yes   Outcome: patient tolerated procedure well with no complications   Post-procedure details: wound care instructions given    Skin cancer screening  Return in about 1 year (around 10/02/2021)  for TBSE, hx of BCCs.   I, Othelia Pulling, RMA, am acting as scribe for Sarina Ser, MD .  Documentation: I have reviewed the above documentation for accuracy and completeness, and I agree with the above.  Sarina Ser, MD

## 2020-10-07 ENCOUNTER — Encounter: Payer: Self-pay | Admitting: Dermatology

## 2020-12-18 ENCOUNTER — Other Ambulatory Visit: Payer: Self-pay | Admitting: Internal Medicine

## 2020-12-18 DIAGNOSIS — Z1231 Encounter for screening mammogram for malignant neoplasm of breast: Secondary | ICD-10-CM

## 2020-12-22 ENCOUNTER — Other Ambulatory Visit: Payer: Self-pay

## 2020-12-22 ENCOUNTER — Ambulatory Visit
Admission: RE | Admit: 2020-12-22 | Discharge: 2020-12-22 | Disposition: A | Discharge status: Home / Self Care | Payer: Medicare HMO | Source: Ambulatory Visit | Attending: Internal Medicine | Admitting: Internal Medicine

## 2020-12-22 DIAGNOSIS — Z1231 Encounter for screening mammogram for malignant neoplasm of breast: Secondary | ICD-10-CM | POA: Insufficient documentation

## 2021-10-05 ENCOUNTER — Ambulatory Visit: Payer: Medicare HMO | Admitting: Dermatology

## 2021-10-05 ENCOUNTER — Other Ambulatory Visit: Payer: Self-pay

## 2021-10-05 ENCOUNTER — Other Ambulatory Visit: Payer: Self-pay | Admitting: Dermatology

## 2021-10-05 DIAGNOSIS — Z85828 Personal history of other malignant neoplasm of skin: Secondary | ICD-10-CM | POA: Diagnosis not present

## 2021-10-05 DIAGNOSIS — D485 Neoplasm of uncertain behavior of skin: Secondary | ICD-10-CM

## 2021-10-05 DIAGNOSIS — D229 Melanocytic nevi, unspecified: Secondary | ICD-10-CM

## 2021-10-05 DIAGNOSIS — D1801 Hemangioma of skin and subcutaneous tissue: Secondary | ICD-10-CM

## 2021-10-05 DIAGNOSIS — L578 Other skin changes due to chronic exposure to nonionizing radiation: Secondary | ICD-10-CM | POA: Diagnosis not present

## 2021-10-05 DIAGNOSIS — Z1283 Encounter for screening for malignant neoplasm of skin: Secondary | ICD-10-CM

## 2021-10-05 DIAGNOSIS — L820 Inflamed seborrheic keratosis: Secondary | ICD-10-CM

## 2021-10-05 DIAGNOSIS — L821 Other seborrheic keratosis: Secondary | ICD-10-CM

## 2021-10-05 DIAGNOSIS — L814 Other melanin hyperpigmentation: Secondary | ICD-10-CM

## 2021-10-05 NOTE — Patient Instructions (Addendum)

## 2021-10-05 NOTE — Progress Notes (Signed)
Follow-Up Visit   Subjective  Kathy Wood is a 70 y.o. female who presents for the following: Annual Exam (History of BCC - TBSE today). The patient presents for Total-Body Skin Exam (TBSE) for skin cancer screening and mole check. The patient has spots, moles and lesions to be evaluated, some may be new or changing and the patient has concerns that these could be cancer.  The following portions of the chart were reviewed this encounter and updated as appropriate:   Tobacco  Allergies  Meds  Problems  Med Hx  Surg Hx  Fam Hx     Review of Systems:  No other skin or systemic complaints except as noted in HPI or Assessment and Plan.  Objective  Well appearing patient in no apparent distress; mood and affect are within normal limits.  A full examination was performed including scalp, head, eyes, ears, nose, lips, neck, chest, axillae, abdomen, back, buttocks, bilateral upper extremities, bilateral lower extremities, hands, feet, fingers, toes, fingernails, and toenails. All findings within normal limits unless otherwise noted below.  Right chest Brown tan papule 0.6 cm        Assessment & Plan   History of Basal Cell Carcinoma of the Skin - No evidence of recurrence today - Recommend regular full body skin exams - Recommend daily broad spectrum sunscreen SPF 30+ to sun-exposed areas, reapply every 2 hours as needed.  - Call if any new or changing lesions are noted between office visits  Lentigines - Scattered tan macules - Due to sun exposure - Benign-appearing, observe - Recommend daily broad spectrum sunscreen SPF 30+ to sun-exposed areas, reapply every 2 hours as needed. - Call for any changes  Seborrheic Keratoses - Stuck-on, waxy, tan-brown papules and/or plaques  - Benign-appearing - Discussed benign etiology and prognosis. - Observe - Call for any changes  Melanocytic Nevi - Tan-brown and/or pink-flesh-colored symmetric macules and papules - Benign  appearing on exam today - Observation - Call clinic for new or changing moles - Recommend daily use of broad spectrum spf 30+ sunscreen to sun-exposed areas.   Hemangiomas - Red papules - Discussed benign nature - Observe - Call for any changes  Actinic Damage - Chronic condition, secondary to cumulative UV/sun exposure - diffuse scaly erythematous macules with underlying dyspigmentation - Recommend daily broad spectrum sunscreen SPF 30+ to sun-exposed areas, reapply every 2 hours as needed.  - Staying in the shade or wearing long sleeves, sun glasses (UVA+UVB protection) and wide brim hats (4-inch brim around the entire circumference of the hat) are also recommended for sun protection.  - Call for new or changing lesions.  Skin cancer screening performed today.  Neoplasm of uncertain behavior of skin Right chest  Epidermal / dermal shaving  Lesion diameter (cm):  0.6 Informed consent: discussed and consent obtained   Timeout: patient name, date of birth, surgical site, and procedure verified   Procedure prep:  Patient was prepped and draped in usual sterile fashion Prep type:  Isopropyl alcohol Anesthesia: the lesion was anesthetized in a standard fashion   Anesthetic:  1% lidocaine w/ epinephrine 1-100,000 buffered w/ 8.4% NaHCO3 Instrument used: flexible razor blade   Hemostasis achieved with: pressure, aluminum chloride and electrodesiccation   Outcome: patient tolerated procedure well   Post-procedure details: sterile dressing applied and wound care instructions given   Dressing type: bandage and petrolatum    Specimen 1 - Surgical pathology Differential Diagnosis: SK vs Hemangioma vs Nevus vs other Check Margins: No  Skin cancer screening  Return in about 1 year (around 10/05/2022) for TBSE.  I, Ashok Cordia, CMA, am acting as scribe for Sarina Ser, MD . Documentation: I have reviewed the above documentation for accuracy and completeness, and I agree with the  above.  Sarina Ser, MD

## 2021-10-07 ENCOUNTER — Telehealth: Payer: Self-pay

## 2021-10-07 NOTE — Telephone Encounter (Signed)
-----   Message from Brendolyn Patty, MD sent at 10/06/2021  5:55 PM EST ----- Skin , right chest SEBORRHEIC KERATOSIS, INFLAMED  Benign ISK - please call pt

## 2021-10-07 NOTE — Telephone Encounter (Signed)
Left message to advise patient results were benign/hd

## 2021-10-18 ENCOUNTER — Encounter: Payer: Self-pay | Admitting: Dermatology

## 2021-11-04 IMAGING — MG MM DIGITAL SCREENING UNILAT*R* W/ TOMO W/ CAD
8 series · 8 of 24 positions shown · non-contrast
Comparison: Previous exam(s).

ACR Breast Density Category a: The breast tissue is almost entirely
fatty.

CLINICAL DATA: Screening.

EXAM:
DIGITAL SCREENING UNILATERAL RIGHT MAMMOGRAM WITH CAD AND
TOMOSYNTHESIS
TECHNIQUE: Right screening digital craniocaudal and mediolateral oblique
mammograms were obtained. Right screening digital breast
tomosynthesis was performed. The images were evaluated with
computer-aided detection.

[R CC synth-2D]
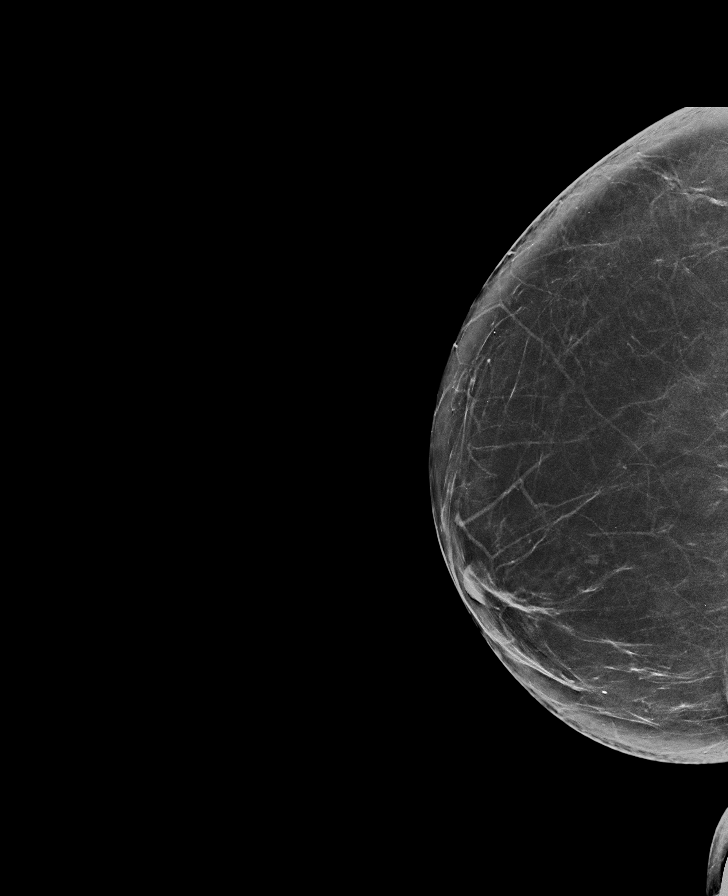

[R MLO synth-2D (1 of 2)]
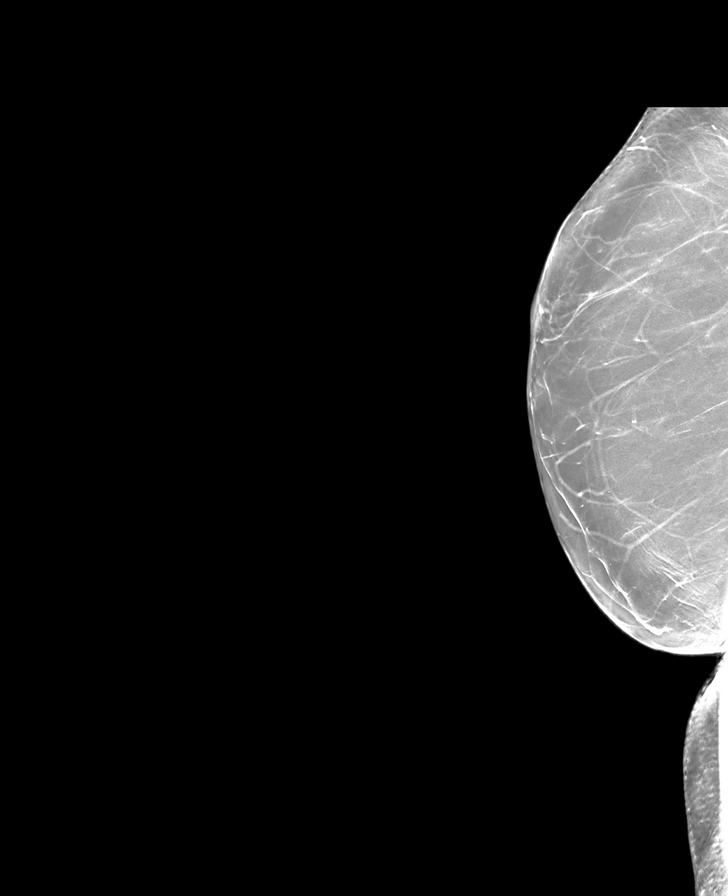

[R MLO synth-2D (2 of 2)]
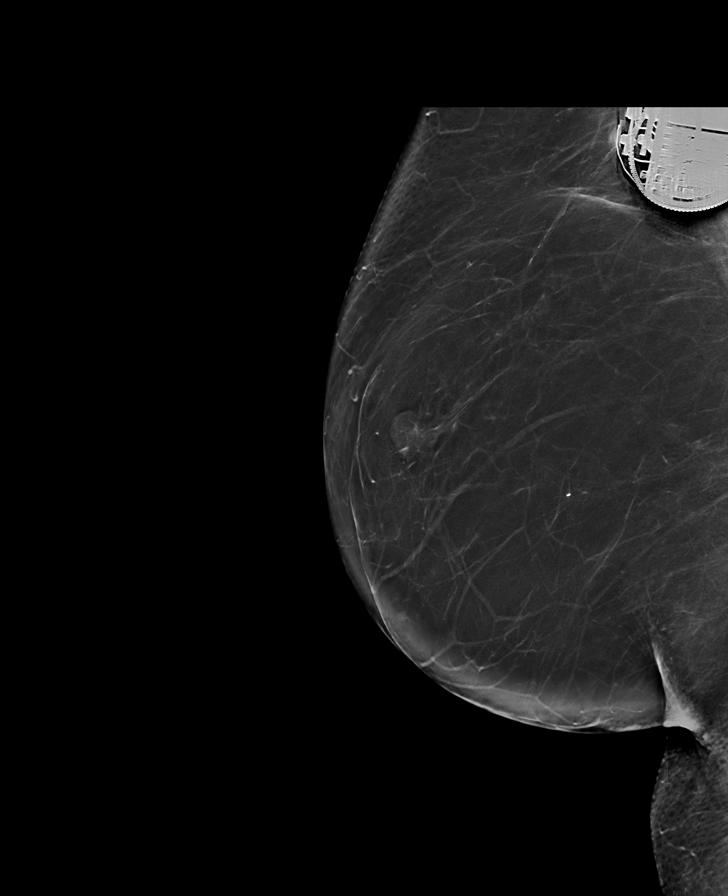

[R CV synth-2D]
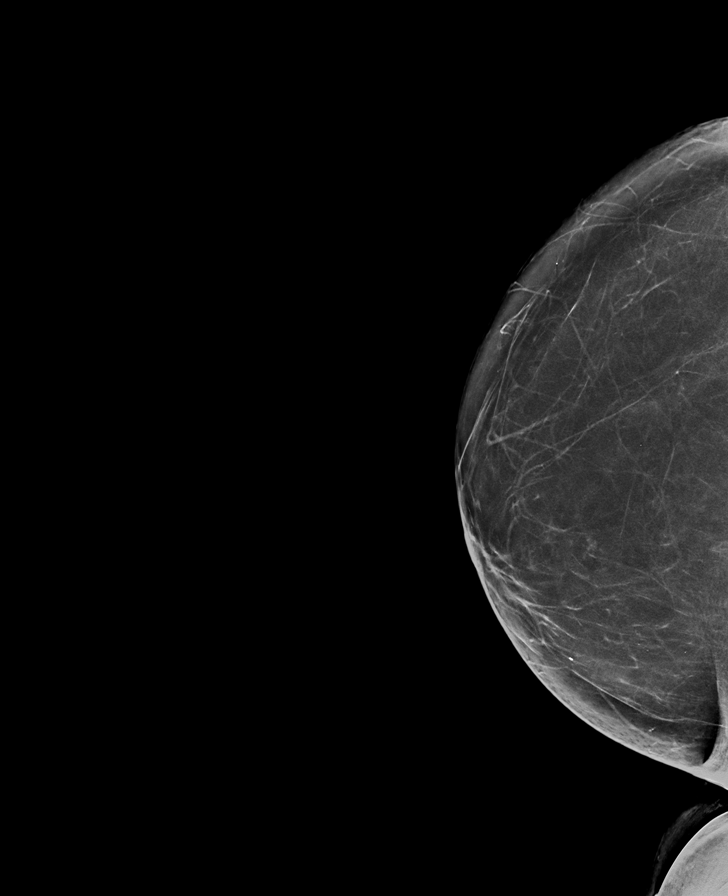

[R MLO tomo (1 of 2) · tomo slice 42/83.0]
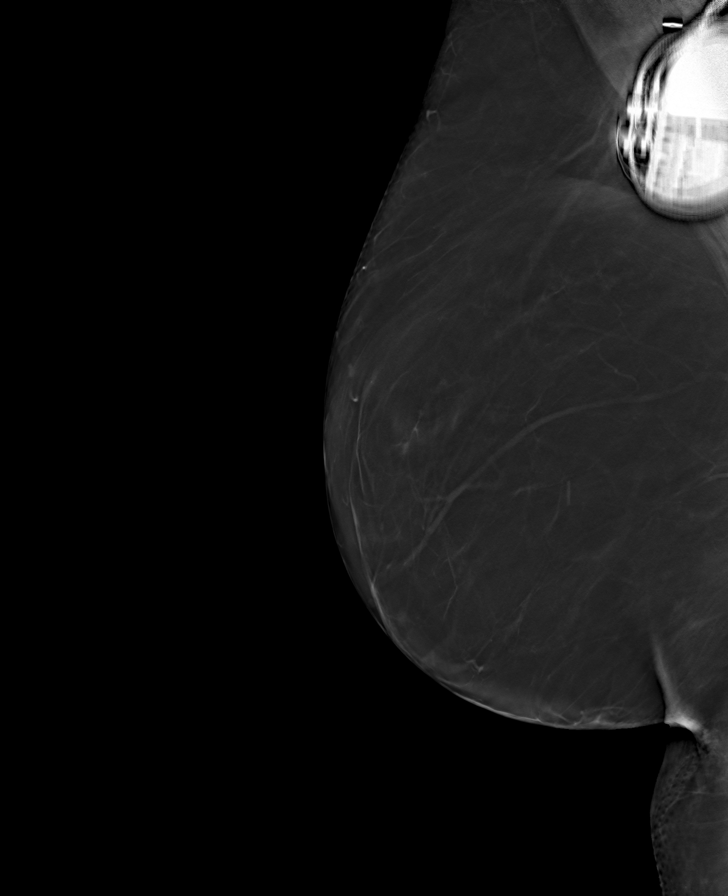

[R CC tomo · tomo slice 37/74.0]
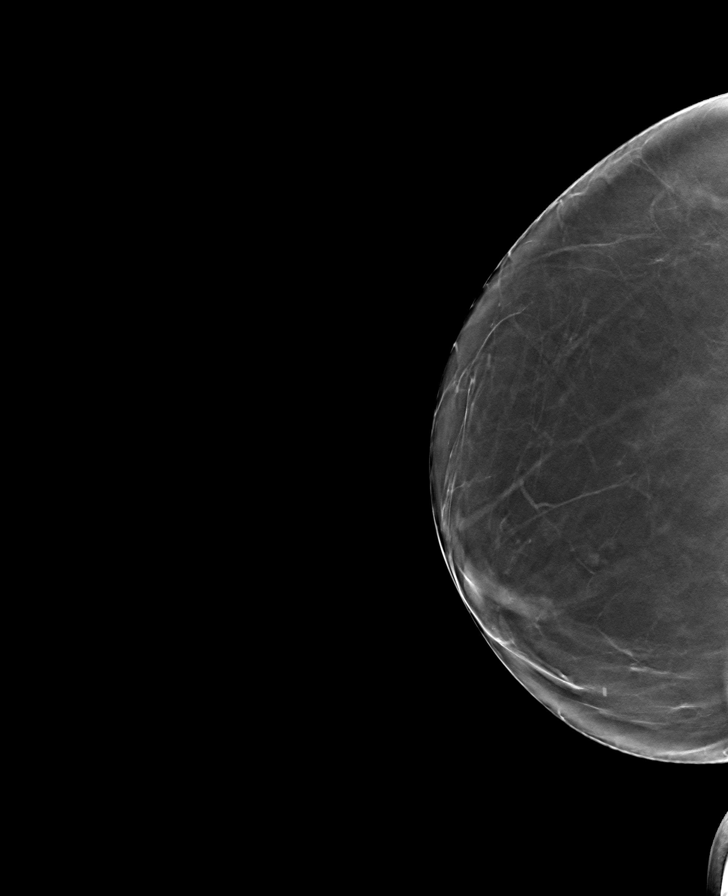

[R CV tomo · tomo slice 37/73.0]
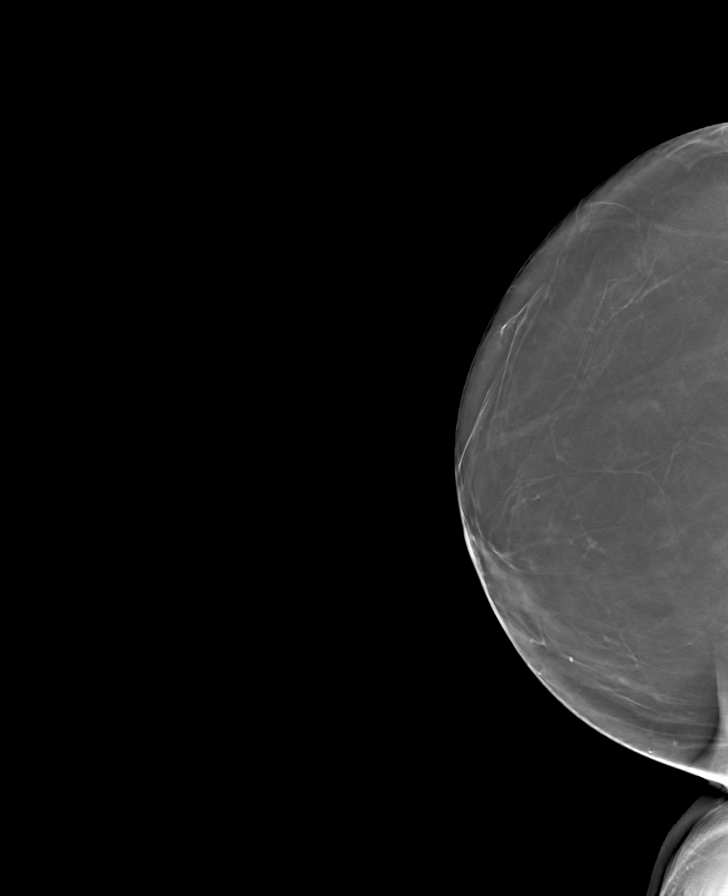

[R MLO tomo (2 of 2) · tomo slice 34/67.0]
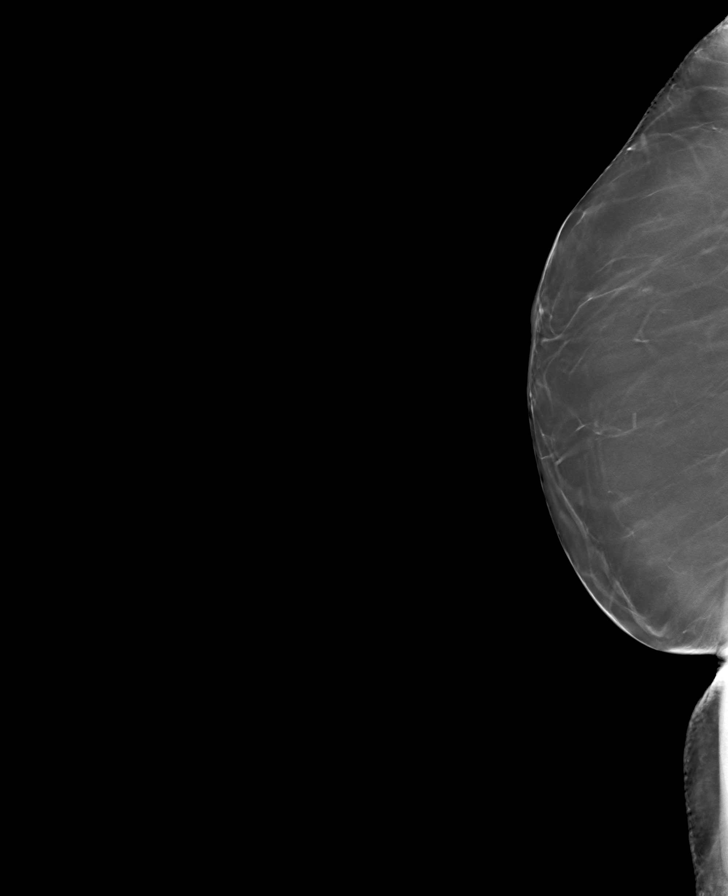

[8 of 24 positions shown; findings below may reference images not displayed]

FINDINGS: There are no findings suspicious for malignancy.
IMPRESSION: No mammographic evidence of malignancy. A result letter of this
screening mammogram will be mailed directly to the patient.

RECOMMENDATION:
Screening mammogram in one year. (Code:AM-U-ACL)

BI-RADS CATEGORY  1: Negative.

## 2021-12-08 ENCOUNTER — Other Ambulatory Visit: Payer: Self-pay | Admitting: Internal Medicine

## 2021-12-08 DIAGNOSIS — Z1231 Encounter for screening mammogram for malignant neoplasm of breast: Secondary | ICD-10-CM

## 2022-02-24 ENCOUNTER — Ambulatory Visit
Admission: RE | Admit: 2022-02-24 | Discharge: 2022-02-24 | Disposition: A | Discharge status: Home / Self Care | Payer: Medicare HMO | Source: Ambulatory Visit | Attending: Internal Medicine | Admitting: Internal Medicine

## 2022-02-24 DIAGNOSIS — Z1231 Encounter for screening mammogram for malignant neoplasm of breast: Secondary | ICD-10-CM | POA: Insufficient documentation

## 2022-10-11 ENCOUNTER — Ambulatory Visit: Payer: Medicare HMO | Admitting: Dermatology

## 2022-10-13 ENCOUNTER — Ambulatory Visit: Payer: Medicare HMO | Admitting: Dermatology

## 2022-10-13 VITALS — BP 133/80 | HR 64

## 2022-10-13 DIAGNOSIS — L814 Other melanin hyperpigmentation: Secondary | ICD-10-CM

## 2022-10-13 DIAGNOSIS — L578 Other skin changes due to chronic exposure to nonionizing radiation: Secondary | ICD-10-CM

## 2022-10-13 DIAGNOSIS — Z1283 Encounter for screening for malignant neoplasm of skin: Secondary | ICD-10-CM

## 2022-10-13 DIAGNOSIS — Z853 Personal history of malignant neoplasm of breast: Secondary | ICD-10-CM

## 2022-10-13 DIAGNOSIS — L821 Other seborrheic keratosis: Secondary | ICD-10-CM

## 2022-10-13 DIAGNOSIS — L570 Actinic keratosis: Secondary | ICD-10-CM | POA: Diagnosis not present

## 2022-10-13 DIAGNOSIS — Z85828 Personal history of other malignant neoplasm of skin: Secondary | ICD-10-CM | POA: Diagnosis not present

## 2022-10-13 DIAGNOSIS — D229 Melanocytic nevi, unspecified: Secondary | ICD-10-CM

## 2022-10-13 DIAGNOSIS — L820 Inflamed seborrheic keratosis: Secondary | ICD-10-CM | POA: Diagnosis not present

## 2022-10-13 NOTE — Progress Notes (Signed)
Follow-Up Visit   Subjective  Kathy Wood is a 71 y.o. female who presents for the following: Annual Exam. Hx of BCC The patient presents for Total-Body Skin Exam (TBSE) for skin cancer screening and mole check.  The patient has spots, moles and lesions to be evaluated, some may be new or changing and the patient has concerns that these could be cancer.   The following portions of the chart were reviewed this encounter and updated as appropriate:   Tobacco  Allergies  Meds  Problems  Med Hx  Surg Hx  Fam Hx     Review of Systems:  No other skin or systemic complaints except as noted in HPI or Assessment and Plan.  Objective  Well appearing patient in no apparent distress; mood and affect are within normal limits.  A full examination was performed including scalp, head, eyes, ears, nose, lips, neck, chest, axillae, abdomen, back, buttocks, bilateral upper extremities, bilateral lower extremities, hands, feet, fingers, toes, fingernails, and toenails. All findings within normal limits unless otherwise noted below.  right forehead x 1, right infraoribital x 1, left medial cheek lateral nose infrauricular x 1, left preauricular x 2 (5) Stuck-on, waxy, tan-brown papules  -- Discussed benign etiology and prognosis.   right cheek Erythematous thin papules/macules with gritty scale.    Assessment & Plan  History of breast cancer Left Breast - No lymphadenopathy - Recommend regular full body skin exams - Recommend daily broad spectrum sunscreen SPF 30+ to sun-exposed areas, reapply every 2 hours as needed.  - Call if any new or changing lesions are noted between office visits  Inflamed seborrheic keratosis (5) right forehead x 1, right infraoribital x 1, left medial cheek lateral nose infrauricular x 1, left preauricular x 2 Symptomatic, irritating, patient would like treated.  If not gone in 2 months return to the office  Destruction of lesion - right forehead x 1, right  infraoribital x 1, left medial cheek lateral nose infrauricular x 1, left preauricular x 2 Complexity: simple   Destruction method: cryotherapy   Informed consent: discussed and consent obtained   Timeout:  patient name, date of birth, surgical site, and procedure verified Lesion destroyed using liquid nitrogen: Yes   Region frozen until ice ball extended beyond lesion: Yes   Outcome: patient tolerated procedure well with no complications   Post-procedure details: wound care instructions given    AK (actinic keratosis) right cheek Actinic keratoses are precancerous spots that appear secondary to cumulative UV radiation exposure/sun exposure over time. They are chronic with expected duration over 1 year. A portion of actinic keratoses will progress to squamous cell carcinoma of the skin. It is not possible to reliably predict which spots will progress to skin cancer and so treatment is recommended to prevent development of skin cancer.  Recommend daily broad spectrum sunscreen SPF 30+ to sun-exposed areas, reapply every 2 hours as needed.  Recommend staying in the shade or wearing long sleeves, sun glasses (UVA+UVB protection) and wide brim hats (4-inch brim around the entire circumference of the hat). Call for new or changing lesions.   Destruction of lesion - right cheek Complexity: simple   Destruction method: cryotherapy   Informed consent: discussed and consent obtained   Timeout:  patient name, date of birth, surgical site, and procedure verified Lesion destroyed using liquid nitrogen: Yes   Region frozen until ice ball extended beyond lesion: Yes   Outcome: patient tolerated procedure well with no complications   Post-procedure  details: wound care instructions given    Lentigines - Scattered tan macules - Due to sun exposure - Benign-appearing, observe - Recommend daily broad spectrum sunscreen SPF 30+ to sun-exposed areas, reapply every 2 hours as needed. - Call for any  changes  Seborrheic Keratoses - Stuck-on, waxy, tan-brown papules and/or plaques  - Benign-appearing - Discussed benign etiology and prognosis. - Observe - Call for any changes  Melanocytic Nevi - Tan-brown and/or pink-flesh-colored symmetric macules and papules - Benign appearing on exam today - Observation - Call clinic for new or changing moles - Recommend daily use of broad spectrum spf 30+ sunscreen to sun-exposed areas.   Hemangiomas - Red papules - Discussed benign nature - Observe - Call for any changes  Actinic Damage - Chronic condition, secondary to cumulative UV/sun exposure - diffuse scaly erythematous macules with underlying dyspigmentation - Recommend daily broad spectrum sunscreen SPF 30+ to sun-exposed areas, reapply every 2 hours as needed.  - Staying in the shade or wearing long sleeves, sun glasses (UVA+UVB protection) and wide brim hats (4-inch brim around the entire circumference of the hat) are also recommended for sun protection.  - Call for new or changing lesions.  History of Basal Cell Carcinoma of the Skin Multiple see history  - No evidence of recurrence today - Recommend regular full body skin exams - Recommend daily broad spectrum sunscreen SPF 30+ to sun-exposed areas, reapply every 2 hours as needed.  - Call if any new or changing lesions are noted between office visits   Skin cancer screening performed today.   Return in about 1 year (around 10/14/2023) for TBSE, hx of BCC.  IMarye Round, CMA, am acting as scribe for Sarina Ser, MD .  Documentation: I have reviewed the above documentation for accuracy and completeness, and I agree with the above.  Sarina Ser, MD

## 2022-10-13 NOTE — Patient Instructions (Addendum)
Cryotherapy Aftercare  Wash gently with soap and water everyday.   Apply Vaseline and Band-Aid daily until healed.     Due to recent changes in healthcare laws, you may see results of your pathology and/or laboratory studies on MyChart before the doctors have had a chance to review them. We understand that in some cases there may be results that are confusing or concerning to you. Please understand that not all results are received at the same time and often the doctors may need to interpret multiple results in order to provide you with the best plan of care or course of treatment. Therefore, we ask that you please give us 2 business days to thoroughly review all your results before contacting the office for clarification. Should we see a critical lab result, you will be contacted sooner.   If You Need Anything After Your Visit  If you have any questions or concerns for your doctor, please call our main line at [phone] and press option 4 to reach your doctor's medical assistant. If no one answers, please leave a voicemail as directed and we will return your call as soon as possible. Messages left after 4 pm will be answered the following business day.   You may also send us a message via MyChart. We typically respond to MyChart messages within 1-2 business days.  For prescription refills, please ask your pharmacy to contact our office. Our fax number is [phone].  If you have an urgent issue when the clinic is closed that cannot wait until the next business day, you can page your doctor at the number below.    Please note that while we do our best to be available for urgent issues outside of office hours, we are not available 24/7.   If you have an urgent issue and are unable to reach us, you may choose to seek medical care at your doctor's office, retail clinic, urgent care center, or emergency room.  If you have a medical emergency, please immediately call 911 or go to the  emergency department.  Pager Numbers  - Dr. Kowalski: [phone]  - Dr. Moye: [phone]  - Dr. Stewart: [phone]  In the event of inclement weather, please call our main line at [phone] for an update on the status of any delays or closures.  Dermatology Medication Tips: Please keep the boxes that topical medications come in in order to help keep track of the instructions about where and how to use these. Pharmacies typically print the medication instructions only on the boxes and not directly on the medication tubes.   If your medication is too expensive, please contact our office at [phone] option 4 or send us a message through MyChart.   We are unable to tell what your co-pay for medications will be in advance as this is different depending on your insurance coverage. However, we may be able to find a substitute medication at lower cost or fill out paperwork to get insurance to cover a needed medication.   If a prior authorization is required to get your medication covered by your insurance company, please allow us 1-2 business days to complete this process.  Drug prices often vary depending on where the prescription is filled and some pharmacies may offer cheaper prices.  The website www.goodrx.com contains coupons for medications through different pharmacies. The prices here do not account for what the cost may be with help from insurance (it may be cheaper with your insurance), but the website can   give you the price if you did not use any insurance.  - You can print the associated coupon and take it with your prescription to the pharmacy.  - You may also stop by our office during regular business hours and pick up a GoodRx coupon card.  - If you need your prescription sent electronically to a different pharmacy, notify our office through [facility] MyChart or by phone at [phone] option 4.     Si Usted Necesita Algo Despus de Su Visita  Tambin puede  enviarnos un mensaje a travs de MyChart. Por lo general respondemos a los mensajes de MyChart en el transcurso de 1 a 2 das hbiles.  Para renovar recetas, por favor pida a su farmacia que se ponga en contacto con nuestra oficina. Nuestro nmero de fax es el [phone].  Si tiene un asunto urgente cuando la clnica est cerrada y que no puede esperar hasta el siguiente da hbil, puede llamar/localizar a su doctor(a) al nmero que aparece a continuacin.   Por favor, tenga en cuenta que aunque hacemos todo lo posible para estar disponibles para asuntos urgentes fuera del horario de oficina, no estamos disponibles las 24 horas del da, los 7 das de la semana.   Si tiene un problema urgente y no puede comunicarse con nosotros, puede optar por buscar atencin mdica  en el consultorio de su doctor(a), en una clnica privada, en un centro de atencin urgente o en una sala de emergencias.  Si tiene una emergencia mdica, por favor llame inmediatamente al 911 o vaya a la sala de emergencias.  Nmeros de bper  - Dr. Kowalski: [phone]  - Dra. Moye: [phone]  - Dra. Stewart: [phone]  En caso de inclemencias del tiempo, por favor llame a nuestra lnea principal al [phone] para una actualizacin sobre el estado de cualquier retraso o cierre.  Consejos para la medicacin en dermatologa: Por favor, guarde las cajas en las que vienen los medicamentos de uso tpico para ayudarle a seguir las instrucciones sobre dnde y cmo usarlos. Las farmacias generalmente imprimen las instrucciones del medicamento slo en las cajas y no directamente en los tubos del medicamento.   Si su medicamento es muy caro, por favor, pngase en contacto con nuestra oficina llamando al [phone] y presione la opcin 4 o envenos un mensaje a travs de MyChart.   No podemos decirle cul ser su copago por los medicamentos por adelantado ya que esto es diferente dependiendo de la cobertura de su seguro.  Sin embargo, es posible que podamos encontrar un medicamento sustituto a menor costo o llenar un formulario para que el seguro cubra el medicamento que se considera necesario.   Si se requiere una autorizacin previa para que su compaa de seguros cubra su medicamento, por favor permtanos de 1 a 2 das hbiles para completar este proceso.  Los precios de los medicamentos varan con frecuencia dependiendo del lugar de dnde se surte la receta y alguna farmacias pueden ofrecer precios ms baratos.  El sitio web www.goodrx.com tiene cupones para medicamentos de diferentes farmacias. Los precios aqu no tienen en cuenta lo que podra costar con la ayuda del seguro (puede ser ms barato con su seguro), pero el sitio web puede darle el precio si no utiliz ningn seguro.  - Puede imprimir el cupn correspondiente y llevarlo con su receta a la farmacia.  - Tambin puede pasar por nuestra oficina durante el horario de atencin regular y recoger una tarjeta de cupones de GoodRx.  -   Si necesita que su receta se enve electrnicamente a una farmacia diferente, informe a nuestra oficina a travs de MyChart de [facility] o por telfono llamando al [phone] y presione la opcin 4.  

## 2022-10-24 ENCOUNTER — Encounter: Payer: Self-pay | Admitting: Dermatology

## 2023-02-25 ENCOUNTER — Other Ambulatory Visit: Payer: Self-pay | Admitting: Internal Medicine

## 2023-02-25 DIAGNOSIS — Z1231 Encounter for screening mammogram for malignant neoplasm of breast: Secondary | ICD-10-CM

## 2023-03-22 ENCOUNTER — Ambulatory Visit
Admission: RE | Admit: 2023-03-22 | Discharge: 2023-03-22 | Disposition: A | Discharge status: Home / Self Care | Payer: Medicare HMO | Source: Ambulatory Visit | Attending: Internal Medicine | Admitting: Internal Medicine

## 2023-03-22 ENCOUNTER — Other Ambulatory Visit: Payer: Self-pay | Admitting: Internal Medicine

## 2023-03-22 DIAGNOSIS — Z1231 Encounter for screening mammogram for malignant neoplasm of breast: Secondary | ICD-10-CM | POA: Insufficient documentation

## 2023-10-19 ENCOUNTER — Encounter: Payer: Self-pay | Admitting: Dermatology

## 2023-10-19 ENCOUNTER — Ambulatory Visit: Payer: Medicare HMO | Admitting: Dermatology

## 2023-10-19 DIAGNOSIS — L570 Actinic keratosis: Secondary | ICD-10-CM | POA: Diagnosis not present

## 2023-10-19 DIAGNOSIS — Z1283 Encounter for screening for malignant neoplasm of skin: Secondary | ICD-10-CM | POA: Diagnosis not present

## 2023-10-19 DIAGNOSIS — D229 Melanocytic nevi, unspecified: Secondary | ICD-10-CM

## 2023-10-19 DIAGNOSIS — W908XXA Exposure to other nonionizing radiation, initial encounter: Secondary | ICD-10-CM | POA: Diagnosis not present

## 2023-10-19 DIAGNOSIS — L821 Other seborrheic keratosis: Secondary | ICD-10-CM

## 2023-10-19 DIAGNOSIS — L814 Other melanin hyperpigmentation: Secondary | ICD-10-CM | POA: Diagnosis not present

## 2023-10-19 DIAGNOSIS — L578 Other skin changes due to chronic exposure to nonionizing radiation: Secondary | ICD-10-CM | POA: Diagnosis not present

## 2023-10-19 DIAGNOSIS — Z85828 Personal history of other malignant neoplasm of skin: Secondary | ICD-10-CM

## 2023-10-19 DIAGNOSIS — D1801 Hemangioma of skin and subcutaneous tissue: Secondary | ICD-10-CM

## 2023-10-19 NOTE — Patient Instructions (Signed)

## 2023-10-19 NOTE — Progress Notes (Signed)
Follow-Up Visit   Subjective  Kathy Wood is a 72 y.o. female who presents for the following: Skin Cancer Screening and Full Body Skin Exam  The patient presents for Total-Body Skin Exam (TBSE) for skin cancer screening and mole check. The patient has spots, moles and lesions to be evaluated, some may be new or changing and the patient may have concern these could be cancer.  The following portions of the chart were reviewed this encounter and updated as appropriate: medications, allergies, medical history  Review of Systems:  No other skin or systemic complaints except as noted in HPI or Assessment and Plan.  Objective  Well appearing patient in no apparent distress; mood and affect are within normal limits.  A full examination was performed including scalp, head, eyes, ears, nose, lips, neck, chest, axillae, abdomen, back, buttocks, bilateral upper extremities, bilateral lower extremities, hands, feet, fingers, toes, fingernails, and toenails. All findings within normal limits unless otherwise noted below.   Relevant physical exam findings are noted in the Assessment and Plan.  Lip x 1 Erythematous thin papules/macules with gritty scale.     Assessment & Plan   SKIN CANCER SCREENING PERFORMED TODAY.  ACTINIC DAMAGE - Chronic condition, secondary to cumulative UV/sun exposure - diffuse scaly erythematous macules with underlying dyspigmentation - Recommend daily broad spectrum sunscreen SPF 30+ to sun-exposed areas, reapply every 2 hours as needed.  - Staying in the shade or wearing long sleeves, sun glasses (UVA+UVB protection) and wide brim hats (4-inch brim around the entire circumference of the hat) are also recommended for sun protection.  - Call for new or changing lesions.  LENTIGINES, SEBORRHEIC KERATOSES, HEMANGIOMAS - Benign normal skin lesions - Benign-appearing - Call for any changes  MELANOCYTIC NEVI - Tan-brown and/or pink-flesh-colored symmetric macules  and papules - Benign appearing on exam today - Observation - Call clinic for new or changing moles - Recommend daily use of broad spectrum spf 30+ sunscreen to sun-exposed areas.   HISTORY OF BASAL CELL CARCINOMA OF THE SKIN - No evidence of recurrence today - Recommend regular full body skin exams - Recommend daily broad spectrum sunscreen SPF 30+ to sun-exposed areas, reapply every 2 hours as needed.  - Call if any new or changing lesions are noted between office visits  AK (actinic keratosis) Lip x 1  Actinic keratoses are precancerous spots that appear secondary to cumulative UV radiation exposure/sun exposure over time. They are chronic with expected duration over 1 year. A portion of actinic keratoses will progress to squamous cell carcinoma of the skin. It is not possible to reliably predict which spots will progress to skin cancer and so treatment is recommended to prevent development of skin cancer.  Recommend daily broad spectrum sunscreen SPF 30+ to sun-exposed areas, reapply every 2 hours as needed.  Recommend staying in the shade or wearing long sleeves, sun glasses (UVA+UVB protection) and wide brim hats (4-inch brim around the entire circumference of the hat). Call for new or changing lesions.   Destruction of lesion - Lip x 1 Complexity: simple   Destruction method: cryotherapy   Informed consent: discussed and consent obtained   Timeout:  patient name, date of birth, surgical site, and procedure verified Lesion destroyed using liquid nitrogen: Yes   Region frozen until ice ball extended beyond lesion: Yes   Outcome: patient tolerated procedure well with no complications   Post-procedure details: wound care instructions given    History of breast cancer of L breast  Return  in about 1 year (around 10/18/2024) for TBSE.  Maylene Roes, CMA, am acting as scribe for Armida Sans, MD .   Documentation: I have reviewed the above documentation for accuracy and  completeness, and I agree with the above.  Armida Sans, MD

## 2024-03-20 ENCOUNTER — Other Ambulatory Visit: Payer: Self-pay | Admitting: Internal Medicine

## 2024-03-20 DIAGNOSIS — Z1231 Encounter for screening mammogram for malignant neoplasm of breast: Secondary | ICD-10-CM

## 2024-03-22 ENCOUNTER — Ambulatory Visit
Admission: RE | Admit: 2024-03-22 | Discharge: 2024-03-22 | Disposition: A | Discharge status: Home / Self Care | Source: Ambulatory Visit | Attending: Internal Medicine | Admitting: Internal Medicine

## 2024-03-22 DIAGNOSIS — Z1231 Encounter for screening mammogram for malignant neoplasm of breast: Secondary | ICD-10-CM | POA: Diagnosis present

## 2024-08-28 ENCOUNTER — Encounter: Payer: Self-pay | Admitting: Dermatology

## 2024-08-28 ENCOUNTER — Ambulatory Visit: Admitting: Dermatology

## 2024-08-28 DIAGNOSIS — D2339 Other benign neoplasm of skin of other parts of face: Secondary | ICD-10-CM

## 2024-08-28 DIAGNOSIS — D485 Neoplasm of uncertain behavior of skin: Secondary | ICD-10-CM | POA: Diagnosis not present

## 2024-08-28 NOTE — Patient Instructions (Addendum)

## 2024-08-28 NOTE — Progress Notes (Signed)
   Follow-Up Visit   Subjective  Kathy Wood is a 73 y.o. female who presents for the following: spot at right nose, present > 1 month, it has bled and it does get a scab on it. Patient does have a hx of BCC.    The following portions of the chart were reviewed this encounter and updated as appropriate: medications, allergies, medical history  Review of Systems:  No other skin or systemic complaints except as noted in HPI or Assessment and Plan. - Objective  Well appearing patient in no apparent distress; mood and affect are within normal limits.   A focused examination was performed of the following areas: face  Relevant exam findings are noted in the Assessment and Plan.  right alar rim 3 mm pink papule with hemorrhagic crusting   Assessment & Plan    NEOPLASM OF UNCERTAIN BEHAVIOR OF SKIN right alar rim Skin / nail biopsy Type of biopsy: tangential   Informed consent: discussed and consent obtained   Timeout: patient name, date of birth, surgical site, and procedure verified   Procedure prep:  Patient was prepped and draped in usual sterile fashion Prep type:  Isopropyl alcohol Anesthesia: the lesion was anesthetized in a standard fashion   Anesthetic:  1% lidocaine  w/ epinephrine  1-100,000 buffered w/ 8.4% NaHCO3 Instrument used: DermaBlade   Hemostasis achieved with: pressure and aluminum chloride   Outcome: patient tolerated procedure well   Post-procedure details: sterile dressing applied and wound care instructions given   Dressing type: bandage and petrolatum    Specimen 1 - Surgical pathology Differential Diagnosis: BCC vs angiofibroma vs nevus vs trichilemomma  Check Margins: No  Return for TBSE, with Dr. MARLA, as scheduled.  [name] Lonell Drones, RMA, am acting as scribe for Boneta Sharps, MD .   Documentation: I have reviewed the above documentation for accuracy and completeness, and I agree with the above.  Boneta Sharps, MD

## 2024-08-30 LAB — SURGICAL PATHOLOGY

## 2024-08-31 ENCOUNTER — Ambulatory Visit: Payer: Self-pay | Admitting: Dermatology

## 2024-08-31 DIAGNOSIS — D485 Neoplasm of uncertain behavior of skin: Secondary | ICD-10-CM

## 2024-09-03 ENCOUNTER — Ambulatory Visit
Admission: RE | Admit: 2024-09-03 | Discharge: 2024-09-03 | Disposition: A | Discharge status: Home / Self Care | Attending: Gastroenterology | Admitting: Gastroenterology

## 2024-09-03 ENCOUNTER — Ambulatory Visit: Admitting: Anesthesiology

## 2024-09-03 ENCOUNTER — Encounter
Admission: RE | Disposition: A | Discharge status: Home / Self Care | Payer: Self-pay | Source: Home / Self Care | Attending: Gastroenterology

## 2024-09-03 ENCOUNTER — Encounter: Payer: Self-pay | Admitting: Gastroenterology

## 2024-09-03 DIAGNOSIS — K625 Hemorrhage of anus and rectum: Secondary | ICD-10-CM | POA: Diagnosis present

## 2024-09-03 DIAGNOSIS — Z950 Presence of cardiac pacemaker: Secondary | ICD-10-CM | POA: Diagnosis not present

## 2024-09-03 DIAGNOSIS — E039 Hypothyroidism, unspecified: Secondary | ICD-10-CM | POA: Diagnosis not present

## 2024-09-03 DIAGNOSIS — C20 Malignant neoplasm of rectum: Secondary | ICD-10-CM | POA: Insufficient documentation

## 2024-09-03 HISTORY — PX: COLONOSCOPY: SHX5424

## 2024-09-03 SURGERY — COLONOSCOPY
Anesthesia: General

## 2024-09-03 MED ORDER — PROPOFOL 10 MG/ML IV BOLUS
INTRAVENOUS | Status: DC | PRN
  Administered 2024-09-03: 100 mg via INTRAVENOUS

## 2024-09-03 MED ORDER — SODIUM CHLORIDE 0.9 % IV SOLN
INTRAVENOUS | Status: DC
  Administered 2024-09-03: 500 mL via INTRAVENOUS

## 2024-09-03 MED ORDER — PROPOFOL 500 MG/50ML IV EMUL
INTRAVENOUS | Status: DC | PRN
  Administered 2024-09-03: 150 ug/kg/min via INTRAVENOUS

## 2024-09-03 MED ORDER — PROPOFOL 1000 MG/100ML IV EMUL
INTRAVENOUS | Status: AC
  Filled 2024-09-03: qty 100

## 2024-09-03 NOTE — Telephone Encounter (Signed)
 Reviewed pathology results with patient. Referral to Dr. Corey sent. Lonell RAMAN., RMA

## 2024-09-03 NOTE — Anesthesia Postprocedure Evaluation (Signed)
 Anesthesia Post Note  Patient: Kathy Wood  Procedure(s) Performed: COLONOSCOPY  Patient location during evaluation: Endoscopy Anesthesia Type: General Level of consciousness: awake and alert Pain management: pain level controlled Vital Signs Assessment: post-procedure vital signs reviewed and stable Respiratory status: spontaneous breathing, nonlabored ventilation and respiratory function stable Cardiovascular status: blood pressure returned to baseline and stable Postop Assessment: no apparent nausea or vomiting Anesthetic complications: no   There were no known notable events for this encounter.   Last Vitals:  Vitals:   09/03/24 1125 09/03/24 1134  BP: (!) 151/52 (!) 165/60  Pulse: 68 62  Resp: 18 15  Temp:    SpO2: 96% 98%    Last Pain:  Vitals:   09/03/24 1134  TempSrc:   PainSc: 0-No pain                 Camellia Merilee Louder

## 2024-09-03 NOTE — Transfer of Care (Signed)
 Immediate Anesthesia Transfer of Care Note  Patient: Kathy Wood  Procedure(s) Performed: COLONOSCOPY  Patient Location: PACU  Anesthesia Type:General  Level of Consciousness: awake and sedated  Airway & Oxygen Therapy: Patient Spontanous Breathing and Patient connected to nasal cannula oxygen  Post-op Assessment: Report given to RN and Post -op Vital signs reviewed and stable  Post vital signs: Reviewed and stable  Last Vitals:  Vitals Value Taken Time  BP 119/35 09/03/24 11:15  Temp 36.1 C 09/03/24 11:15  Pulse 67 09/03/24 11:16  Resp 18 09/03/24 11:16  SpO2 100 % 09/03/24 11:16  Vitals shown include unfiled device data.  Last Pain:  Vitals:   09/03/24 1115  TempSrc: Temporal  PainSc:          Complications: There were no known notable events for this encounter.

## 2024-09-03 NOTE — Anesthesia Preprocedure Evaluation (Addendum)
 Anesthesia Evaluation  Patient identified by MRN, date of birth, ID band Patient awake    Reviewed: Allergy & Precautions, H&P , NPO status , Patient's Chart, lab work & pertinent test results  Airway Mallampati: II  TM Distance: >3 FB Neck ROM: full    Dental no notable dental hx.    Pulmonary neg pulmonary ROS   Pulmonary exam normal        Cardiovascular Normal cardiovascular exam+ dysrhythmias + pacemaker + Valvular Problems/Murmurs (Hx of  AVR - with synthetic Aortic valve on 12/12/18./ Pacemaker (post -op Heart block))   Mild ascending aorta dilatation   Neuro/Psych negative neurological ROS  negative psych ROS   GI/Hepatic negative GI ROS, Neg liver ROS,,,  Endo/Other  Hypothyroidism    Renal/GU negative Renal ROS  negative genitourinary   Musculoskeletal   Abdominal Normal abdominal exam  (+)   Peds  Hematology negative hematology ROS (+)   Anesthesia Other Findings Rectal bleeding  Past Medical History: No date: Aortic valvar stenosis     Comment:  Valve replacement. [facility]. 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 Medtronic Azure Xt Dr Levada Pauling. [id number] h  Past Surgical History: 2010: AUGMENTATION MAMMAPLASTY; Left     Comment:  mastectomy 2006: BREAST LUMPECTOMY; Left     Comment:  positive 11/29/2018: CARDIAC CATHETERIZATION     Comment:  [facility] 12/13/2018: CARDIAC VALVE REPLACEMENT     Comment:  [facility]. Aortic Valve. 05/22/2019: CATARACT EXTRACTION  W/PHACO; Left     Comment:  Procedure: CATARACT EXTRACTION PHACO AND INTRAOCULAR               LENS PLACEMENT (IOC) left;  Surgeon: Jaye Fallow,               MD;  Location: [facility];  Service:               Ophthalmology;  Laterality: Left; 06/12/2019: CATARACT EXTRACTION W/PHACO; Right     Comment:  Procedure: CATARACT EXTRACTION PHACO AND INTRAOCULAR               LENS PLACEMENT (IOC)  RIGHT;  Surgeon: Jaye Fallow,              MD;  Location: [facility];  Service:               Ophthalmology;  Laterality: Right; 10/29/2016: COLONOSCOPY WITH PROPOFOL ; N/A     Comment:  Procedure: COLONOSCOPY WITH PROPOFOL ;  Surgeon: Gladis RAYMOND Mariner, MD;  Location: [facility] ENDOSCOPY;  Service:               Endoscopy;  Laterality: N/A; 12/15/2018: INSERT / REPLACE / REMOVE PACEMAKER     Comment:  [facility].  Medtronic Azure Xt Dr Levada Pauling [id number] h 2010: MASTECTOMY; Left No date: RECONSTRUCTIVE REPAIR STERNAL; Left 2010: REDUCTION MAMMAPLASTY; Right     Reproductive/Obstetrics negative OB ROS  Anesthesia Physical Anesthesia Plan  ASA: 3  Anesthesia Plan: General   Post-op Pain Management: Minimal or no pain anticipated   Induction: Intravenous  PONV Risk Score and Plan: Propofol  infusion and TIVA  Airway Management Planned: Natural Airway  Additional Equipment:   Intra-op Plan:   Post-operative Plan:   Informed Consent: I have reviewed the patients History and Physical, chart, labs and discussed the procedure including the risks, benefits and alternatives for the proposed anesthesia with the patient or authorized representative who has indicated his/her understanding and acceptance.     Dental Advisory Given  Plan Discussed with: CRNA and Surgeon  Anesthesia Plan Comments:          Anesthesia Quick Evaluation

## 2024-09-03 NOTE — Op Note (Signed)
 [facility] Gastroenterology Patient Name: Kathy Wood Procedure Date: 09/03/2024 10:30 AM MRN: [id number] Account #: [id number] Date of Birth: [birth year] Admit Type: Outpatient Age: 73 Room: [facility] ENDO ROOM 1 Gender: Female Note Status: Finalized Instrument Name: Colon Scope [id number] Procedure:             Colonoscopy Indications:           Rectal bleeding Providers:             Ruel Kung MD, MD Referring MD:          Ruel Kung MD, MD (Referring MD) Medicines:             Monitored Anesthesia Care Complications:         No immediate complications. Procedure:             Pre-Anesthesia Assessment:                        - Prior to the procedure, a History and Physical was                         performed, and patient medications, allergies and                         sensitivities were reviewed. The patient's tolerance                         of previous anesthesia was reviewed.                        - The risks and benefits of the procedure and the                         sedation options and risks were discussed with the                         patient. All questions were answered and informed                         consent was obtained.                        - ASA Grade Assessment: II - A patient with mild                         systemic disease.                        After obtaining informed consent, the colonoscope was                         passed under direct vision. Throughout the procedure,                         the patient's blood pressure, pulse, and oxygen                         saturations were monitored continuously. The                         Colonoscope was introduced through  the anus and                         advanced to the the cecum, identified by the                         appendiceal orifice. The colonoscopy was performed                         with ease. The patient tolerated the procedure well.                          The quality of the bowel preparation was excellent.                         The ileocecal valve, appendiceal orifice, and rectum                         were photographed. Findings:      The perianal and digital rectal examinations were normal.      A polypoid non-obstructing medium-sized mass was found in the distal       rectum. The mass was non-circumferential. The mass measured two cm in       length. In addition, its diameter measured three mm. no bleeding was       present. Mass was 3-5 cm from anus - see retroflexed image to see       proximity Mucosa was biopsied with a cold forceps for histology. One       specimen bottle was sent to pathology.      The exam was otherwise without abnormality on direct and retroflexion       views.      Multiple medium-mouthed diverticula were found in the sigmoid colon. Impression:            - Rule out malignancy, tumor in the distal rectum.                         Biopsied.                        - The examination was otherwise normal on direct and                         retroflexion views. Recommendation:        - Discharge patient to home (with escort).                        - Resume previous diet.                        - Continue present medications.                        - Await pathology results.                        - Repeat colonoscopy for surveillance based on                         pathology results. Procedure Code(s):     --- Professional ---  54621, Colonoscopy, flexible; diagnostic, including                         collection of specimen(s) by brushing or washing, when                         performed (separate procedure) Diagnosis Code(s):     --- Professional ---                        D49.0, Neoplasm of unspecified behavior of digestive                         system                        K62.5, Hemorrhage of anus and rectum CPT copyright 2022 American Medical Association. All rights  reserved. The codes documented in this report are preliminary and upon coder review may  be revised to meet current compliance requirements. Ruel Kung, MD Ruel Kung MD, MD 09/03/2024 11:19:49 AM This report has been signed electronically. Number of Addenda: 0 Note Initiated On: 09/03/2024 10:30 AM Scope Withdrawal Time: 0 hours 12 minutes 3 seconds  Total Procedure Duration: 0 hours 14 minutes 44 seconds  Estimated Blood Loss:  Estimated blood loss: none.      [facility]

## 2024-09-03 NOTE — H&P (Signed)
 Ruel Kung , MD [address], [place], [place], [address] Phone: [phone] Fax: [phone]  Primary Care Physician:  Sadie Manna, MD   Pre-Procedure History & Physical: HPI:  Kathy Wood is a 73 y.o. female is here for an colonoscopy.   Past Medical History:  Diagnosis Date   Aortic valvar stenosis    Valve replacement. [facility]. 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 Medtronic Azure Xt Dr Levada Pauling. [id number] h    Past Surgical History:  Procedure Laterality Date   AUGMENTATION MAMMAPLASTY Left 2010   mastectomy   BREAST LUMPECTOMY Left 2006   positive   CARDIAC CATHETERIZATION  11/29/2018   [facility]   CARDIAC VALVE REPLACEMENT  12/13/2018   [facility]. Aortic Valve.   CATARACT EXTRACTION W/PHACO Left 05/22/2019   Procedure: CATARACT EXTRACTION PHACO AND INTRAOCULAR LENS PLACEMENT (IOC) left;  Surgeon: Jaye Fallow, MD;  Location: [facility];  Service: Ophthalmology;  Laterality: Left;   CATARACT EXTRACTION W/PHACO Right 06/12/2019   Procedure: CATARACT EXTRACTION PHACO AND INTRAOCULAR LENS PLACEMENT (IOC)  RIGHT;  Surgeon: Jaye Fallow, MD;  Location: [facility];  Service: Ophthalmology;  Laterality: Right;   COLONOSCOPY WITH PROPOFOL  N/A 10/29/2016   Procedure: COLONOSCOPY WITH PROPOFOL ;  Surgeon: Gladis RAYMOND Mariner, MD;  Location: [facility] ENDOSCOPY;  Service: Endoscopy;  Laterality: N/A;   INSERT / REPLACE / REMOVE PACEMAKER   12/15/2018   [facility].  Medtronic Azure Xt Dr Levada Pauling [id number] h   MASTECTOMY Left 2010   RECONSTRUCTIVE REPAIR STERNAL Left    REDUCTION MAMMAPLASTY Right 2010    Prior to Admission medications   Medication Sig Start Date End Date Taking? Authorizing Provider  alendronate (FOSAMAX) 70 MG tablet Take 70 mg by mouth once a week. 12/17/18   [provider]  calcium carbonate (OS-CAL) 600 MG TABS tablet Take 600 mg by mouth daily.    [provider]  Cholecalciferol (VITAMIN D3) 25 MCG (1000 UT) CAPS Take by mouth.    [provider]  levothyroxine (SYNTHROID, LEVOTHROID) 75 MCG tablet  12/02/18   [provider]  metoprolol tartrate (LOPRESSOR) 25 MG tablet Take 12.5 mg by mouth 2 (two) times daily.    [provider]  Multiple Vitamin (MULTIVITAMIN) tablet Take 1 tablet by mouth daily.    [provider]  pravastatin (PRAVACHOL) 40 MG tablet Take 40 mg by mouth daily.    [provider]    Allergies as of 08/20/2024 - Review Complete 10/19/2023  Allergen Reaction Noted   Taxotere [docetaxel] Other (See Comments) and Anaphylaxis 03/10/2015    Family History  Problem Relation Age of Onset   Breast cancer Neg Hx     Social History   Socioeconomic History   Marital status: Married    Spouse name: Not on file   Number of children: Not on file   Years of education: Not on file   Highest education level: Not on file  Occupational History   Not on file  Tobacco Use  Smoking status: Never   Smokeless tobacco: Never  Vaping Use   Vaping status: Never Used  Substance and Sexual Activity   Alcohol use: No   Drug use: No   Sexual activity: Not on file  Other Topics Concern   Not on file  Social History Narrative   Not on file   Social Drivers of Health   Financial Resource Strain: Low Risk  (08/13/2024)   Received from [facility]   Overall Financial Resource Strain (CARDIA)    Difficulty of  Paying Living Expenses: Not hard at all  Food Insecurity: No Food Insecurity (08/13/2024)   Received from [facility]   Hunger Vital Sign    Within the past 12 months, you worried that your food would run out before you got the money to buy more.: Never true    Within the past 12 months, the food you bought just didn't last and you didn't have money to get more.: Never true  Transportation Needs: No Transportation Needs (08/13/2024)   Received from [facility] - Transportation    In the past 12 months, has lack of transportation kept you from medical appointments or from getting medications?: No    Lack of Transportation (Non-Medical): No  Physical Activity: Insufficiently Active (12/12/2018)   Received from [facility]   Exercise Vital Sign    On average, how many days per week do you engage in moderate to strenuous exercise (like a brisk walk)?: 3 days    On average, how many minutes do you engage in exercise at this level?: 40 min  Stress: No Stress Concern Present (12/12/2018)   Received from [facility] - Occupational Stress Questionnaire    Feeling of Stress : Not at all  Social Connections: Socially Integrated (12/12/2018)   Received from [facility]   Social Connection and Isolation Panel    In a typical week, how many times do you talk on the phone with family, friends, or neighbors?: More than three times a week    How often do you get together with friends or relatives?: Twice a week    How often do you attend church or religious services?: More than 4 times per year    Do you belong to any clubs or organizations such as church groups, unions, fraternal or athletic groups, or school groups?: Yes    How often do you attend meetings of the clubs or organizations you belong to?: More than 4 times per year    Are you married, widowed, divorced,  separated, never married, or living with a partner?: Married  Intimate Partner Violence: Not on file    Review of Systems: See HPI, otherwise negative ROS  Physical Exam: There were no vitals taken for this visit. General:   Alert,  pleasant and cooperative in NAD Head:  Normocephalic and atraumatic. Neck:  Supple; no masses or thyromegaly. Lungs:  Clear throughout to auscultation, normal respiratory effort.    Heart:  +S1, +S2, Regular rate and rhythm, No edema. Abdomen:  Soft, nontender and nondistended. Normal bowel sounds, without guarding, and without rebound.   Neurologic:  Alert and  oriented x4;  grossly normal neurologically.  Impression/Plan: Kathy Wood is here for an colonoscopy to be performed for rectal bleeding  Risks, benefits, limitations, and alternatives regarding  colonoscopy have been reviewed with the patient.  Questions have been  answered.  All parties agreeable.   Ruel Kung, MD  09/03/2024, 9:58 AM

## 2024-09-03 NOTE — Telephone Encounter (Signed)
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 They are typically benign, but the pathologist thought yours looked somewhat unusual and  concerning for skin cancer. To be safe, we recommend surgery to ensure it is fully removed. It will be done by Dr Corey in [place]. She is very experienced with treating skin cancers on the nose. I  expect it will be a brief procedure. Dr Corey will use local numbing with lidocaine  like I did with the biopsy, cut away any remainder of the trichilemmoma, and discuss wound care vs reconstruction  options. ----- Message ----- From: Interface, Lab In Three Zero One Sent: 08/30/2024   3:59 PM EDT To: Boneta Sharps, MD

## 2024-09-04 ENCOUNTER — Telehealth: Payer: Self-pay

## 2024-09-04 ENCOUNTER — Other Ambulatory Visit: Payer: Self-pay

## 2024-09-04 DIAGNOSIS — C20 Malignant neoplasm of rectum: Secondary | ICD-10-CM

## 2024-09-04 DIAGNOSIS — Z950 Presence of cardiac pacemaker: Secondary | ICD-10-CM

## 2024-09-04 LAB — SURGICAL PATHOLOGY

## 2024-09-04 NOTE — Telephone Encounter (Signed)
 Spoke with Kathy Wood on the phone. Educated regarding rectal cancer staging scans CT chest/abdomen and pelvic MRI. MRI will need to be completed at [facility] due to pacemaker. We will have these scheduled for her and review instructions at her appointment tomorrow.

## 2024-09-04 NOTE — Progress Notes (Signed)
 Staging scans ordered per Dr. Melanee. MRI pelvis pending Medtronic Azure dual-chamber pacemaker with Medtronic [id number] atrial and ventricular leads, all implanted 12/15/2018, approval. Voicemail left with Ms. Gertz to return call.

## 2024-09-05 ENCOUNTER — Encounter: Payer: Self-pay | Admitting: Oncology

## 2024-09-05 ENCOUNTER — Inpatient Hospital Stay: Attending: Oncology | Admitting: Oncology

## 2024-09-05 ENCOUNTER — Telehealth: Payer: Self-pay | Admitting: Oncology

## 2024-09-05 ENCOUNTER — Inpatient Hospital Stay

## 2024-09-05 VITALS — BP 147/56 | HR 69 | Temp 96.6°F | Resp 19 | Ht 63.5 in | Wt 171.6 lb

## 2024-09-05 DIAGNOSIS — Z9221 Personal history of antineoplastic chemotherapy: Secondary | ICD-10-CM | POA: Insufficient documentation

## 2024-09-05 DIAGNOSIS — Z950 Presence of cardiac pacemaker: Secondary | ICD-10-CM | POA: Insufficient documentation

## 2024-09-05 DIAGNOSIS — Z923 Personal history of irradiation: Secondary | ICD-10-CM | POA: Diagnosis not present

## 2024-09-05 DIAGNOSIS — Z9012 Acquired absence of left breast and nipple: Secondary | ICD-10-CM | POA: Diagnosis not present

## 2024-09-05 DIAGNOSIS — Z86000 Personal history of in-situ neoplasm of breast: Secondary | ICD-10-CM | POA: Diagnosis not present

## 2024-09-05 DIAGNOSIS — C20 Malignant neoplasm of rectum: Secondary | ICD-10-CM | POA: Insufficient documentation

## 2024-09-05 DIAGNOSIS — Z853 Personal history of malignant neoplasm of breast: Secondary | ICD-10-CM | POA: Insufficient documentation

## 2024-09-05 DIAGNOSIS — M810 Age-related osteoporosis without current pathological fracture: Secondary | ICD-10-CM | POA: Diagnosis not present

## 2024-09-05 LAB — CBC WITH DIFFERENTIAL (CANCER CENTER ONLY)
Abs Immature Granulocytes: 0.03 K/uL (ref 0.00–0.07)
Basophils Absolute: 0.1 K/uL (ref 0.0–0.1)
Basophils Relative: 1 %
Eosinophils Absolute: 0.1 K/uL (ref 0.0–0.5)
Eosinophils Relative: 2 %
HCT: 41.5 % (ref 36.0–46.0)
Hemoglobin: 13.8 g/dL (ref 12.0–15.0)
Immature Granulocytes: 1 %
Lymphocytes Relative: 33 %
Lymphs Abs: 2 K/uL (ref 0.7–4.0)
MCH: 32 pg (ref 26.0–34.0)
MCHC: 33.3 g/dL (ref 30.0–36.0)
MCV: 96.3 fL (ref 80.0–100.0)
Monocytes Absolute: 0.4 K/uL (ref 0.1–1.0)
Monocytes Relative: 7 %
Neutro Abs: 3.3 K/uL (ref 1.7–7.7)
Neutrophils Relative %: 56 %
Platelet Count: 262 K/uL (ref 150–400)
RBC: 4.31 MIL/uL (ref 3.87–5.11)
RDW: 11.9 % (ref 11.5–15.5)
WBC Count: 6 K/uL (ref 4.0–10.5)
nRBC: 0 % (ref 0.0–0.2)

## 2024-09-05 LAB — CMP (CANCER CENTER ONLY)
ALT: 19 U/L (ref 0–44)
AST: 23 U/L (ref 15–41)
Albumin: 4.2 g/dL (ref 3.5–5.0)
Alkaline Phosphatase: 69 U/L (ref 38–126)
Anion gap: 9 (ref 5–15)
BUN: 17 mg/dL (ref 8–23)
CO2: 25 mmol/L (ref 22–32)
Calcium: 9.5 mg/dL (ref 8.9–10.3)
Chloride: 104 mmol/L (ref 98–111)
Creatinine: 0.81 mg/dL (ref 0.44–1.00)
GFR, Estimated: >60 mL/min (ref >60)
Glucose, Bld: 95 mg/dL (ref 70–99)
Potassium: 4.4 mmol/L (ref 3.5–5.1)
Sodium: 138 mmol/L (ref 135–145)
Total Bilirubin: 0.5 mg/dL (ref 0.0–1.2)
Total Protein: 7.7 g/dL (ref 6.5–8.1)

## 2024-09-05 LAB — IRON AND TIBC
Iron: 67 ug/dL (ref 28–170)
Saturation Ratios: 19 % (ref 10.4–31.8)
TIBC: 351 ug/dL (ref 250–450)
UIBC: 284 ug/dL

## 2024-09-05 LAB — FERRITIN: Ferritin: 131 ng/mL (ref 11–307)

## 2024-09-05 NOTE — Telephone Encounter (Signed)
 Called pt to sched CT's - no answer - left vm asking for return call - LH

## 2024-09-05 NOTE — Progress Notes (Signed)
 Met with Kathy Wood following appointment with Dr. Melanee. We have reviewed upcoming imaging appointments and instructions. All questions answered. Provided Risk manager. Encouraged to call with any questions.

## 2024-09-05 NOTE — Progress Notes (Unsigned)
 New patient; Referral for Rectal Adenocarcinoma.

## 2024-09-06 LAB — CEA: CEA: 3.7 ng/mL (ref 0.0–4.7)

## 2024-09-06 NOTE — Progress Notes (Signed)
 Hematology/Oncology Consult note Kathy Wood Telephone:([phone] [phone]  Patient Care Team: Sadie Manna, MD as PCP - General (Internal Medicine) Maurie Rayfield BIRCH, RN as Oncology Nurse Navigator   Name of the patient: Kathy Wood  [id number]  [birth year]    Reason for referral-new diagnosis of rectal cancer   Referring physician-Dr. Therisa  Date of visit: 09/06/24   History of presenting illness- Discussed the use of AI scribe software for clinical note transcription with the patient, who gave verbal consent to proceed.  History of Present Illness   Kathy Wood is a 73 year old female with a history of breast cancer who presents with rectal bleeding and a newly diagnosed rectal mass. She was referred by [facility] GI NPChristiane London for evaluation of rectal bleeding.  She has experienced intermittent rectal bleeding since the summer, with the initial episode not recurring. Colonoscopy showed nonobstructing medium-sized mass 3 to 5 cm from the anus measuring 2 cm in size.  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  She has a history of breast cancer treated with mastectomy and chemotherapy 15 years ago. Genetic testing five years ago did not reveal significant findings. No family history of colon, breast, or stomach cancer.  She denies other symptoms such as pain or pressure.     History of Present Illness   ECOG PS- 1  Pain scale- 0   Review of systems- Review of Systems  Constitutional:  Negative for chills, fever, malaise/fatigue and weight loss.  HENT:  Negative for congestion, ear discharge and nosebleeds.   Eyes:  Negative for blurred vision.  Respiratory:  Negative for cough, hemoptysis, sputum production, shortness of breath and wheezing.   Cardiovascular:   Negative for chest pain, palpitations, orthopnea and claudication.  Gastrointestinal:  Negative for abdominal pain, blood in stool, constipation, diarrhea, heartburn, melena, nausea and vomiting.       Rectal bleeding  Genitourinary:  Negative for dysuria, flank pain, frequency, hematuria and urgency.  Musculoskeletal:  Negative for back pain, joint pain and myalgias.  Skin:  Negative for rash.  Neurological:  Negative for dizziness, tingling, focal weakness, seizures, weakness and headaches.  Endo/Heme/Allergies:  Does not bruise/bleed easily.  Psychiatric/Behavioral:  Negative for depression and suicidal ideas. The patient does not have insomnia.     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 [facility]. 12/13/18   Basal cell carcinoma    Nasolabial and L eyelid area txted yrs ago per patient   Breast cancer [facility]) 2006   left breast/ DCIS   Breast cancer (HCC) 2009   left breast/ triple NEG   Cancer of left breast (HCC) 2009   Hyperlipidemia    Hypothyroidism    Osteoporosis    Personal history of chemotherapy 2009   Personal history  of radiation therapy 2006   Presence of permanent cardiac pacemaker 12/15/2018   [facility]. Medtronic Azure Xt Dr Levada Pauling. [id number] h     Past Surgical History:  Procedure Laterality Date   AUGMENTATION MAMMAPLASTY Left 2010   mastectomy   BREAST LUMPECTOMY Left 2006   positive   CARDIAC CATHETERIZATION  11/29/2018   [facility]    CARDIAC VALVE REPLACEMENT  12/13/2018   [facility]. Aortic Valve.   CATARACT EXTRACTION W/PHACO Left 05/22/2019   Procedure: CATARACT EXTRACTION PHACO AND INTRAOCULAR LENS PLACEMENT (IOC) left;  Surgeon: Jaye Fallow, MD;  Location[facility];  Service: Ophthalmology;  Laterality: Left;   CATARACT EXTRACTION W/PHACO Right 06/12/2019   Procedure: CATARACT EXTRACTION PHACO AND INTRAOCULAR LENS PLACEMENT (IOC)  RIGHT;  Surgeon: Jaye Fallow, MD;  Location: [facility];  Service: Ophthalmology;  Laterality: Right;   COLONOSCOPY N/A 09/03/2024   Procedure: COLONOSCOPY;  Surgeon: Therisa Bi, MD;  Location: [facility] ENDOSCOPY;  Service: Gastroenterology;  Laterality: N/A;  ANTIBIOTICS BEFORE PROCEDURE PER OFFICE   COLONOSCOPY WITH PROPOFOL  N/A 10/29/2016   Procedure: COLONOSCOPY WITH PROPOFOL ;  Surgeon: Gladis RAYMOND Mariner, MD;  Location: [facility] END[facility]COPY;  Service: Endoscopy;  Laterality: N/A;   EYE SURGERY     INSERT / REPLACE / REMOVE PACEMAKER  12/15/2018   [facility].  Medtronic Azure Xt Dr Levada Pauling [id number] h   MASTECTOMY Left 2010   RECONSTRUCTIVE REPAIR STERNAL Left    REDUCTION MAMMAPLASTY Right 2010    Social History   Socioeconomic History   Marital status: Married    Spouse name: Celestine Prim   Number of children: 3   Years of education: Not on file   Highest education level: Not on file  Occupational History   Not on file  Tobacco Use   Smoking status: Never   Smokeless tobacco: Never  Vaping Use   Vaping status: Never Used  Substance and Sexual Activity   Alcohol use: Yes    Comment: rare drinker   Drug use: No   Sexual activity: Yes  Other Topics Concern   Not on file  Social History Narrative   Not on file   Social Drivers of Health   Financial Resource Strain: Low Risk  (08/13/2024)   Received from [facility]   Overall Financial Resource Strain (CARDIA)    Difficulty of Paying Living Expenses: Not hard at all  Food  Insecurity: No Food Insecurity (09/05/2024)   Hunger Vital Sign    Worried About Running Out of Food in the Last Year: Never true    Ran Out of Food in the Last Year: Never true  Transportation Needs: No Transportation Needs (09/05/2024)   PRAPARE - Administrator, Civil Service (Medical): No    Lack of Transportation (Non-Medical): No  Physical Activity: Insufficiently Active (12/12/2018)   Received from [facility]   Exercise Vital Sign    On average, how many days per week do you engage in moderate to strenuous exercise (like a brisk walk)?: 3 days    On average, how many minutes do you engage in exercise at this level?: 40 min  Stress: No Stress Concern Present (12/12/2018)   Received from [facility] - Occupational Stress Questionnaire    Feeling of Stress : Not at all  Social Connections: Socially Integrated (12/12/2018)   Received from [facility]   Social Connection and Isolation Panel    In a typical  week, how many times do you talk on the phone with family, friends, or neighbors?: More than three times a week    How often do you get together with friends or relatives?: Twice a week    How often do you attend church or religious services?: More than 4 times per year    Do you belong to any clubs or organizations such as church groups, unions, fraternal or athletic groups, or school groups?: Yes    How often do you attend meetings of the clubs or organizations you belong to?: More than 4 times per year    Are you married, widowed, divorced, separated, never married, or living with a partner?: Married  Intimate Partner Violence: Not At Risk (09/05/2024)   Humiliation, Afraid, Rape, and Kick questionnaire    Fear of Current or Ex-Partner: No    Emotionally Abused: No    Physically Abused: No    Sexually Abused: No     Family History  Problem Relation Age of Onset   Breast  cancer Neg Hx      Current Outpatient Medications:    calcium carbonate (OS-CAL) 600 MG TABS tablet, Take 600 mg by mouth daily., Disp: , Rfl:    Cholecalciferol (VITAMIN D3) 25 MCG (1000 UT) CAPS, Take by mouth., Disp: , Rfl:    levothyroxine (SYNTHROID, LEVOTHROID) 75 MCG tablet, Take 75 mcg by mouth daily before breakfast., Disp: , Rfl:    metoprolol tartrate (LOPRESSOR) 25 MG tablet, Take 12.5 mg by mouth 2 (two) times daily., Disp: , Rfl:    Multiple Vitamin (MULTIVITAMIN) tablet, Take 1 tablet by mouth daily., Disp: , Rfl:    pravastatin (PRAVACHOL) 40 MG tablet, Take 40 mg by mouth daily., Disp: , Rfl:    alendronate (FOSAMAX) 70 MG tablet, Take 70 mg by mouth once a week. (Patient not taking: Reported on 09/03/2024), Disp: , Rfl:    Aspirin 81 MG CAPS, Take by oral route., Disp: , Rfl:    metoprolol succinate (TOPROL-XL) 25 MG 24 hr tablet, Take 25 mg by mouth daily. (Patient not taking: Reported on 09/05/2024), Disp: , Rfl:    polyethylene glycol-electrolytes (NULYTELY) 420 g solution, as directed. (Patient not taking: Reported on 09/05/2024), Disp: , Rfl:    Physical exam:  Vitals:   09/05/24 1551  BP: (!) 147/56  Pulse: 69  Resp: 19  Temp: (!) 96.6 F (35.9 C)  TempSrc: Tympanic  SpO2: 98%  Weight: 171 lb 9.6 oz (77.8 kg)  Height: 5' 3.5 (1.613 m)   Physical Exam Cardiovascular:     Rate and Rhythm: Normal rate and regular rhythm.     Heart sounds: Normal heart sounds.  Pulmonary:     Effort: Pulmonary effort is normal.     Breath sounds: Normal breath sounds.  Abdominal:     General: Bowel sounds are normal.     Palpations: Abdomen is soft.  Skin:    General: Skin is warm and dry.  Neurological:     Mental Status: She is alert and oriented to person, place, and time.           Latest Ref Rng & Units 09/05/2024    4:18 PM  CMP  Glucose 70 - 99 mg/dL 95   BUN 8 - 23 mg/dL 17   Creatinine 9.55 - 1.00 mg/dL 9.18   Sodium 864 - 854 mmol/L 138    Potassium 3.5 - 5.1 mmol/L 4.4   Chloride 98 - 111 mmol/L 104   CO2  22 - 32 mmol/L 25   Calcium 8.9 - 10.3 mg/dL 9.5   Total Protein 6.5 - 8.1 g/dL 7.7   Total Bilirubin 0.0 - 1.2 mg/dL 0.5   Alkaline Phos 38 - 126 U/L 69   AST 15 - 41 U/L 23   ALT 0 - 44 U/L 19       Latest Ref Rng & Units 09/05/2024    4:18 PM  CBC  WBC 4.0 - 10.5 K/uL 6.0   Hemoglobin 12.0 - 15.0 g/dL 86.1   Hematocrit 63.9 - 46.0 % 41.5   Platelets 150 - 400 K/uL 262      Assessment and plan- Patient is a 73 y.o. 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 Biopsy confirmed moderate to poorly differentiated adenocarcinoma. Further staging required to assess depth and lymph node involvement. MRI of the pelvis needed for T and N stage. CT of the chest, abdomen, and pelvis needed for distant metastasis. Tumor treatable and potentially curable if confined to rectal area. Neoadjuvant chemotherapy and/or radiation considered if tumor is deeper or involves lymph nodes. Surgical opinion necessary due to distal location. - Order MRI of the pelvis on November 11 to assess tumor depth and lymph node involvement.  Unable to get sooner appointments due to need for pacemaker compliant MRI - Order CT of the chest, abdomen, and pelvis early next week to assess for distant metastasis. - Refer to colorectal surgeon for surgical opinion post-MRI results. - Order CEA blood test to establish a baseline marker for rectal cancer.  History of breast cancer, status post mastectomy and chemotherapy Breast cancer treated with mastectomy and chemotherapy 15 years ago. No family history reported. Previous genetic testing performed, details unclear. Further information needed to determine necessity of repeat  testing. - Verify details of previous genetic testing with OB GYN to determine if repeat testing is necessary.  Presence of cardiac pacemaker complicating diagnostic imaging Cardiac pacemaker complicates MRI scheduling due to magnetic interference. Special MRI slots required. MRI preferred over endorectal ultrasound for assessing tumor depth and lymph node involvement. - Schedule MRI of the pelvis on November 11, utilizing special slots for patients with pacemakers.      Follow-up with me to be decided based on MRI and CT scan results   Thank you for this kind referral and the opportunity to participate in the care of this patient   Visit Diagnosis 1. Rectal cancer [facility])     Dr. Annah Skene, MD, MPH [facility] [phone] 09/06/2024

## 2024-09-07 ENCOUNTER — Other Ambulatory Visit: Payer: Self-pay

## 2024-09-07 DIAGNOSIS — C20 Malignant neoplasm of rectum: Secondary | ICD-10-CM

## 2024-09-10 ENCOUNTER — Ambulatory Visit
Admission: RE | Admit: 2024-09-10 | Discharge: 2024-09-10 | Disposition: A | Discharge status: Home / Self Care | Source: Ambulatory Visit | Attending: Oncology | Admitting: Oncology

## 2024-09-10 ENCOUNTER — Ambulatory Visit

## 2024-09-10 DIAGNOSIS — C20 Malignant neoplasm of rectum: Secondary | ICD-10-CM | POA: Insufficient documentation

## 2024-09-10 MED ORDER — IOHEXOL 9 MG/ML PO SOLN
500.0000 mL | ORAL | Status: AC
  Administered 2024-09-10: 500 mL via ORAL

## 2024-09-10 MED ORDER — IOHEXOL 300 MG/ML  SOLN
100.0000 mL | Freq: Once | INTRAMUSCULAR | Status: AC | PRN
  Administered 2024-09-10: 100 mL via INTRAVENOUS

## 2024-09-14 ENCOUNTER — Encounter: Payer: Self-pay | Admitting: Oncology

## 2024-09-20 ENCOUNTER — Other Ambulatory Visit (HOSPITAL_COMMUNITY): Payer: Self-pay | Admitting: Oncology

## 2024-09-20 DIAGNOSIS — C20 Malignant neoplasm of rectum: Secondary | ICD-10-CM

## 2024-09-25 ENCOUNTER — Other Ambulatory Visit (HOSPITAL_COMMUNITY): Payer: Self-pay | Admitting: Oncology

## 2024-09-25 ENCOUNTER — Ambulatory Visit (HOSPITAL_COMMUNITY)
Admission: RE | Admit: 2024-09-25 | Discharge: 2024-09-25 | Disposition: A | Discharge status: Home / Self Care | Source: Ambulatory Visit | Attending: Oncology | Admitting: Oncology

## 2024-09-25 ENCOUNTER — Ambulatory Visit: Payer: Self-pay | Admitting: Oncology

## 2024-09-25 ENCOUNTER — Other Ambulatory Visit: Payer: Self-pay

## 2024-09-25 DIAGNOSIS — Z853 Personal history of malignant neoplasm of breast: Secondary | ICD-10-CM

## 2024-09-25 DIAGNOSIS — Z950 Presence of cardiac pacemaker: Secondary | ICD-10-CM | POA: Diagnosis present

## 2024-09-25 DIAGNOSIS — C20 Malignant neoplasm of rectum: Secondary | ICD-10-CM

## 2024-09-25 NOTE — Progress Notes (Signed)
 Patient was monitored by this RN during MRI scan due to presence of a pacemaker. Cardiac rhythm was continuously monitored throughout the procedure. Prior to the start of the scan, the pacemaker was placed in MRI-safe mode by the MRI technician and/or pacemaker representative. Following the completion of the scan, the device was returned to its pre-MRI settings. Neurological status and orientation post-procedure were unchanged from baseline.   Pre-procedure Heart Rate (Prior to being placed in MRI safe mode): 64bpm Post-procedure Heart Rate (Once pacemaker is returned to baseline mode): 67bpm

## 2024-09-26 ENCOUNTER — Inpatient Hospital Stay: Attending: Oncology | Admitting: Oncology

## 2024-09-26 ENCOUNTER — Encounter: Payer: Self-pay | Admitting: Oncology

## 2024-09-26 VITALS — BP 126/51 | HR 73 | Temp 96.8°F | Resp 19 | Ht 63.5 in | Wt 171.8 lb

## 2024-09-26 DIAGNOSIS — Z7189 Other specified counseling: Secondary | ICD-10-CM | POA: Diagnosis not present

## 2024-09-26 DIAGNOSIS — Z950 Presence of cardiac pacemaker: Secondary | ICD-10-CM | POA: Insufficient documentation

## 2024-09-26 DIAGNOSIS — Z9221 Personal history of antineoplastic chemotherapy: Secondary | ICD-10-CM | POA: Diagnosis not present

## 2024-09-26 DIAGNOSIS — Z923 Personal history of irradiation: Secondary | ICD-10-CM | POA: Diagnosis not present

## 2024-09-26 DIAGNOSIS — Z853 Personal history of malignant neoplasm of breast: Secondary | ICD-10-CM | POA: Diagnosis not present

## 2024-09-26 DIAGNOSIS — C20 Malignant neoplasm of rectum: Secondary | ICD-10-CM | POA: Diagnosis not present

## 2024-09-26 DIAGNOSIS — Z85828 Personal history of other malignant neoplasm of skin: Secondary | ICD-10-CM | POA: Insufficient documentation

## 2024-09-26 NOTE — Progress Notes (Signed)
 Patient here for MRI results and is not sleeping; no new or acute concerns.

## 2024-09-27 ENCOUNTER — Telehealth: Payer: Self-pay

## 2024-09-27 NOTE — Telephone Encounter (Signed)
 Per Dr. Darold wrap up [facility] placement ASAP. Patient will start infusional 5-FU chemotherapy tentatively on 10/15/2024 .  Per Kathy Wood, I can put her in place of that other patient - Fri 11/21 at 9:30a an arrive at 8:30a. Let me know. Thanks. Outbound call; spoke to patient and she agreed to date / time for port placement; informed NPO after midnight, needs a driver due to moderate sedation being involved and to avoid strenuous activity / heavy lifting for 24 - 48 hours.

## 2024-09-28 ENCOUNTER — Encounter: Payer: Self-pay | Admitting: Oncology

## 2024-09-28 ENCOUNTER — Telehealth: Payer: Self-pay

## 2024-09-28 ENCOUNTER — Ambulatory Visit: Payer: Self-pay | Admitting: Oncology

## 2024-09-28 NOTE — Telephone Encounter (Signed)
 Contacted [facility] in [place] for BRCA results. Report received and is being scanned to HIM.

## 2024-09-30 DIAGNOSIS — C20 Malignant neoplasm of rectum: Secondary | ICD-10-CM | POA: Insufficient documentation

## 2024-09-30 NOTE — Progress Notes (Signed)
 Hematology/Oncology Consult note [facility]  Telephone:([phone] [phone]  Patient Care Team: Sadie Manna, MD as PCP - General (Internal Medicine) Maurie Rayfield BIRCH, RN as Oncology Nurse Navigator Melanee Annah BROCKS, MD as Consulting Physician (Oncology)   Name of the patient: Kathy Wood  [id number]  06-25-1951   Date of visit: 09/30/24  Diagnosis-  Cancer Staging  Cancer of left breast [facility]) Staging form: Breast, AJCC 7th Edition - Clinical: Stage IA (T1c, N0, M0) - Unsigned Laterality: Left  Rectal cancer [facility]) Staging form: Colon and Rectum, AJCC 8th Edition - Clinical stage from 09/30/2024: Stage IIIB (cT3, cN1a, cM0) - Signed by Melanee Annah BROCKS, MD on 09/30/2024 Histopathologic type: Adenocarcinoma, NOS Total positive nodes: 1    Chief complaint/ Reason for visit- discuss MRI results and further management  Heme/Onc history: Kathy Wood is a 73 year old female with a history of breast cancer who presents with rectal bleeding and a newly diagnosed rectal mass. She was referred by [facility] GI NPChristiane London for evaluation of rectal bleeding.   She has experienced intermittent rectal bleeding since the summer, with the initial episode not recurring. Colonoscopy showed nonobstructing medium-sized mass 3 to 5 cm from the anus measuring 2 cm in size.  Biopsy was consistent with moderate to poorly differentiated adenocarcinoma. MSI stable disease.    Her last colonoscopy in 2017 showed no abnormalities, and she has not undergone any other related procedures since then.   She has a history of breast cancer treated with mastectomy and chemotherapy 15 years ago. Genetic testing five years ago did not reveal significant findings. No family history of colon, breast, or stomach cancer.   She denies other symptoms such as pain or pressure.   MRI pelvis on 09/25/2024 showed T3a N1 disease with possible involvement of levator  ani musculature.  Distance of the tumor from anal verge 5 cm.  Tumor distance from internal anal sphincter 2.5 cm.  Possible small volume extension at 7 o'clock position less than 1 mm T3a.  High mesorectal lymph node 8 mm and 1     Interval history-patient reports occasional rectal bleeding but is otherwise doing well presently.  Denies any constipation  ECOG PS- 0 Pain scale- 0   Review of systems- Review of Systems  Constitutional:  Negative for chills, fever, malaise/fatigue and weight loss.  HENT:  Negative for congestion, ear discharge and nosebleeds.   Eyes:  Negative for blurred vision.  Respiratory:  Negative for cough, hemoptysis, sputum production, shortness of breath and wheezing.   Cardiovascular:  Negative for chest pain, palpitations, orthopnea and claudication.  Gastrointestinal:  Positive for blood in stool. Negative for abdominal pain, constipation, diarrhea, heartburn, melena, nausea and vomiting.  Genitourinary:  Negative for dysuria, flank pain, frequency, hematuria and urgency.  Musculoskeletal:  Negative for back pain, joint pain and myalgias.  Skin:  Negative for rash.  Neurological:  Negative for dizziness, tingling, focal weakness, seizures, weakness and headaches.  Endo/Heme/Allergies:  Does not bruise/bleed easily.  Psychiatric/Behavioral:  Negative for depression and suicidal ideas. The patient does not have insomnia.       Allergies  Allergen Reactions   Taxotere [Docetaxel] Other (See Comments) and Anaphylaxis    flush and back pain     Past Medical History:  Diagnosis Date   Aortic valvar stenosis    Valve replacement. [facility]. 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 Medtronic Azure Xt Dr Levada Pauling. [id number]     Past Surgical History:  Procedure Laterality Date   AUGMENTATION MAMMAPLASTY Left 2010   mastectomy   BREAST LUMPECTOMY Left 2006   positive   CARDIAC CATHETERIZATION  11/29/2018   [facility]   CARDIAC VALVE REPLACEMENT  12/13/2018   [facility]. Aortic Valve.   CATARACT EXTRACTION W/PHACO Left 05/22/2019   Procedure: CATARACT EXTRACTION PHACO AND INTRAOCULAR LENS PLACEMENT (IOC) left;  Surgeon: Jaye Fallow, MD;  Location: [facility];  Service: Ophthalmology;  Laterality: Left;   CATARACT EXTRACTION W/PHACO Right 06/12/2019   Procedure: CATARACT EXTRACTION PHACO AND INTRAOCULAR LENS PLACEMENT (IOC)  RIGHT;  Surgeon: Jaye Fallow, MD;  Location: [facility];  Service: Ophthalmology;  Laterality: Right;   COLONOSCOPY N/A 09/03/2024   Procedure: COLONOSCOPY;  Surgeon: Therisa Bi, MD;  Location: [facility] ENDOSCOPY;  Service: Gastroenterology;  Laterality: N/A;  ANTIBIOTICS BEFORE PROCEDURE PER OFFICE   COLONOSCOPY WITH PROPOFOL  N/A 10/29/2016   Procedure: COLONOSCOPY WITH PROPOFOL ;  Surgeon: Gladis RAYMOND Mariner, MD;  Location: [facility] ENDOSCOPY;  Service: Endoscopy;  Laterality: N/A;   EYE SURGERY     INSERT / REPLACE / REMOVE PACEMAKER  12/15/2018   [facility].  Medtronic Azure Xt Dr Levada Pauling [id number]   MASTECTOMY Left 2010   RECONSTRUCTIVE REPAIR STERNAL Left    REDUCTION MAMMAPLASTY Right 2010    Social History   Socioeconomic History   Marital status: Married    Spouse name: Beckett Hickmon   Number of children: 3   Years of education: Not on file   Highest education level: Not on file  Occupational History   Not on file  Tobacco Use   Smoking status: Never   Smokeless tobacco: Never  Vaping Use   Vaping status: Never Used  Substance and Sexual Activity   Alcohol use: Yes    Comment: rare drinker   Drug use: No    Sexual activity: Yes  Other Topics Concern   Not on file  Social History Narrative   Not on file   Social Drivers of Health   Financial Resource Strain: Low Risk  (08/13/2024)   [facility]   Overall Financial Resource Strain (CARDIA)    Difficulty of Paying Living Expenses: Not hard at all  Food Insecurity: No Food Insecurity (09/05/2024)   Hunger Vital Sign    Worried About Running Out of Food in the Last Year: Never true    Ran Out of Food in the Last Year: Never true  Transportation Needs: No Transportation Needs (09/05/2024)   PRAPARE - Administrator, Civil Service (Medical): No    Lack of Transportation (Non-Medical): No  Physical Activity: Insufficiently Active (12/12/2018)   Received from [facility]   Exercise Vital Sign    On average, how many days per week do you engage in moderate to strenuous exercise (like a brisk walk)?: 3 days    On average, how many minutes do you engage in exercise at this level?: 40 min  Stress: No Stress Concern Present (12/12/2018)  Received [facility]   [employer] of Occupational Health - Occupational Stress Questionnaire    Feeling of Stress : Not at all  Social Connections: Socially Integrated (12/12/2018)   Received from [facility]   Social Connection and Isolation Panel    In a typical week, how many times do you talk on the phone with family, friends, or neighbors?: More than three times a week    How often do you get together with friends or relatives?: Twice a week    How often do you attend church or religious services?: More than 4 times per year    Do you belong to any clubs or organizations such as church groups, unions, fraternal or athletic groups, or school groups?: Yes    How often do you attend meetings of the clubs or organizations you belong to?: More than 4 times per year    Are you married, widowed, divorced,  separated, never married, or living with a partner?: Married  Intimate Partner Violence: Not At Risk (09/05/2024)   Humiliation, Afraid, Rape, and Kick questionnaire    Fear of Current or Ex-Partner: No    Emotionally Abused: No    Physically Abused: No    Sexually Abused: No    Family History  Problem Relation Age of Onset   Breast cancer Neg Hx      Current Outpatient Medications:    Aspirin 81 MG CAPS, Take by oral route., Disp: , Rfl:    calcium carbonate (OS-CAL) 600 MG TABS tablet, Take 600 mg by mouth daily., Disp: , Rfl:    Cholecalciferol (VITAMIN D3) 25 MCG (1000 UT) CAPS, Take by mouth., Disp: , Rfl:    levothyroxine (SYNTHROID, LEVOTHROID) 75 MCG tablet, Take 75 mcg by mouth daily before breakfast., Disp: , Rfl:    metoprolol tartrate (LOPRESSOR) 25 MG tablet, Take 12.5 mg by mouth 2 (two) times daily., Disp: , Rfl:    Multiple Vitamin (MULTIVITAMIN) tablet, Take 1 tablet by mouth daily., Disp: , Rfl:    pravastatin (PRAVACHOL) 40 MG tablet, Take 40 mg by mouth daily., Disp: , Rfl:    alendronate (FOSAMAX) 70 MG tablet, Take 70 mg by mouth once a week. (Patient not taking: Reported on 09/03/2024), Disp: , Rfl:    metoprolol succinate (TOPROL-XL) 25 MG 24 hr tablet, Take 25 mg by mouth daily. (Patient not taking: Reported on 09/05/2024), Disp: , Rfl:    polyethylene glycol-electrolytes (NULYTELY) 420 g solution, as directed. (Patient not taking: Reported on 09/05/2024), Disp: , Rfl:   Physical exam:  Vitals:   09/26/24 0909 09/26/24 0910  BP: (!) 144/52 (!) 126/51  Pulse: 73   Resp: 19   Temp: (!) 96.8 F (36 C)   TempSrc: Tympanic   SpO2: 97%   Weight: 171 lb 12.8 oz (77.9 kg)   Height: 5' 3.5 (1.613 m)    Physical Exam Cardiovascular:     Rate and Rhythm: Normal rate and regular rhythm.     Heart sounds: Normal heart sounds.  Pulmonary:     Effort: Pulmonary effort is normal.     Breath sounds: Normal breath sounds.  Skin:    General: Skin is warm and  dry.  Neurological:     Mental Status: She is alert and oriented to person, place, and time.      I have personally reviewed labs listed below:    Latest Ref Rng & Units 09/05/2024    4:18 PM  CMP  Glucose 70 - 99 mg/dL 95   BUN 8 - 23 mg/dL 17   Creatinine 9.55 - 1.00 mg/dL 9.18   Sodium 864 - 854 mmol/L 138   Potassium 3.5 - 5.1 mmol/L 4.4   Chloride 98 - 111 mmol/L 104   CO2 22 - 32 mmol/L 25   Calcium 8.9 - 10.3 mg/dL 9.5   Total Protein 6.5 - 8.1 g/dL 7.7   Total Bilirubin 0.0 - 1.2 mg/dL 0.5   Alkaline Phos 38 - 126 U/L 69   AST 15 - 41 U/L 23   ALT 0 - 44 U/L 19       Latest Ref Rng & Units 09/05/2024    4:18 PM  CBC  WBC 4.0 - 10.5 K/uL 6.0   Hemoglobin 12.0 - 15.0 g/dL 86.1   Hematocrit 63.9 - 46.0 % 41.5   Platelets 150 - 400 K/uL 262    I have personally reviewed Radiology images listed below: No images are attached to the encounter.  MR PELVIS WO CM RECTAL CA STAGING Result Date: 09/25/2024 EXAM: MR RECTAL TECHNIQUE: Multiplanar multisequence MR imaging of the pelvis was performed. No intravenous contrast was administered. Ultrasound gel was administered per rectum to optimize tumor evaluation. COMPARISON: CT of 10 / 27 / 25.recent colonoscopy report of 10 / 20 / 25 also reviewed. CLINICAL HISTORY: Rectal cancer, staging. Rectal cancer, staging. FINDINGS: TUMOR LOCATION Tumor distance from Anal Verge/Skin surface:  5.0 cm on image 18 / 3. Tumor distance to Internal Anal sphincter: 2.5 cm on image 19 / 4 TUMOR DESCRIPTION Circumferential extent: The primary is somewhat challenging to identify secondary to its presumed low rectal position and underdistention in this area. Approximately 180 degrees centered at the 6 to 7 oclock position including on image 16 / 8. Tumor Size and volume: Felt to be optimally measured at 2.0x1.1x2.0 cm on images 17 / 2 and 16 / 4. 2.3 cc. T - CATEGORY Extension through Muscularis Propria: The propria is difficult to visualize in this low  rectal position. Possible small volume extension at the 7 oclock position on image 17 / 8. Yes<67mm=T3a Shortest Distance of any tumor/node from Mesorectal fascia:  not applicable.the mesorectum is absent in this low rectal location. Extramural Vascular Invasion/Tumor Thrombus: No Invasion of Anterior Peritoneal Reflection: No Involvement of Adjacent Organs or Pelvic Sidewall: No Levator Ani Involvement: Right sided involvement is possible on image 17 / 8. N - CATEGORY Mesorectal Lymph Nodes >=46mm: a high mesorectal 8 mm node on image 4 / 2.  N1 Extra-mesorectal Lymphadenopathy: No Other: Left sided small bladder diverticulum. Diverticulosis. No significant free fluid. IMPRESSION: 1. Rectal adenocarcinoma T stage:T3a. 2. Rectal adenocarcinoma N stage:N1 3. Possible involvement of the right levator ani musculature. 4.  * Tracking Code: RSE * Electronically signed by: Rockey Kilts MD 09/25/2024 12:23 PM EST RP Workstation: [id number]   DG Chest 1 View Result Date: 09/25/2024 CLINICAL DATA:  Rectal cancer.  Pacemaker. EXAM: CHEST  1 VIEW COMPARISON:  01/31/2008 FINDINGS: Right-sided dual lead permanent pacemaker evident. Lungs mildly hyperexpanded but clear. The cardiopericardial silhouette is within normal limits for size. No acute bony abnormality. IMPRESSION: No active disease. Electronically Signed   By: Camellia Candle M.D.   On: 09/25/2024 07:31   CT CHEST ABDOMEN PELVIS W CONTRAST Result Date: 09/12/2024 EXAM: CT CHEST, ABDOMEN AND PELVIS WITH CONTRAST 09/10/2024 02:35:45 PM CLINICAL HISTORY: Rectal carcinoma. Staging.   * Tracking Code: BO * TECHNIQUE: CT of the chest, abdomen and pelvis was  performed with the administration of intravenous contrast. Multiplanar reformatted images are provided for review. Automated exposure control, iterative reconstruction, and/or weight based adjustment of the mA/kV was utilized to reduce the radiation dose to as low as reasonably achievable. CONTRAST: / Omnipaque  300 MG/ML COMPARISON: None available. FINDINGS: CHEST: There is a 4.0 cm ascending thoracic aortic aneurysm. Prior aortic valve replacement, and pacemaker leads are noted in the right heart. MEDIASTINUM AND LYMPH NODES: Previous left mastectomy and breast reconstruction are seen. No mediastinal, hilar or axillary lymphadenopathy is noted. LUNGS AND PLEURA: No pleural effusion or pneumothorax. Right middle lobe scarring is present. ABDOMEN AND PELVIS: LIVER: The liver is unremarkable. GALLBLADDER AND BILE DUCTS: Gallbladder is unremarkable. No biliary ductal dilatation. SPLEEN: No acute abnormality. PANCREAS: No acute abnormality. ADRENAL GLANDS: No acute abnormality. KIDNEYS, URETERS AND BLADDER: Small Left sided bladder diverticulum is noted. No stones in the kidneys or ureters. No hydronephrosis. No perinephric or periureteral stranding. GI AND BOWEL: No definite rectal or colonic mass is identified by CT. Sigmoid diverticulosis is noted, without signs of diverticulitis. REPRODUCTIVE ORGANS: No acute abnormality is noted. PERITONEUM AND RETROPERITONEUM: No ascites. No free air. VASCULATURE: Unremarkable. No abdominal aortic aneurysm, ABDOMINAL AND PELVIS LYMPH NODES: No lymphadenopathy. BONES AND SOFT TISSUES: No acute osseous abnormality. No focal soft tissue abnormality. IMPRESSION: 1. No evidence of metastatic disease in the chest, abdomen, or pelvis. 2. Sigmoid diverticulosis without signs of diverticulitis. 3. Ascending thoracic aortic aneurysm measuring 4.0 cm. Recommend continued follow up by chest CTA in 1 year. Electronically signed by: Norleen Kil MD 09/12/2024 06:46 AM EDT RP Workstation: [id number]     Assessment and plan- Patient is a 73 y.o. 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 MRI suggests penetration beyond muscularis and one involved lymph node. No distant metastasis.  For T3 N1 disease if this tumor was not low-lying we could have considered doing neoadjuvant chemotherapy alone and then response guided therapy with consideration for surgery without chemo or radiation to avoid late toxicities from radiation. Given that her isolated lymph node which was reported to be positive on MRI is less than 1 cm this is sometimes hard to ascertain on MRI as not all lymph nodes which are seen with MRI truly represent metastatic disease.  I will defer to colorectal surgery at [facility] if they feel that ultrasound-guided FNA of the lymph nodes is needed at this time.  However patient's tumor is less than or equal to 5 cm from the anal verge and therefore considered to be a low-lying tumor.  I would therefore recommend total neoadjuvant therapy for her.   Chemoradiation preferred initially. Discussed chemotherapy options: oral 5FU (xeloda) taking twice daily Monday to Friday during 5 weeks of radiation and IV 5FU via port.  Infusional 5-FU would involve continuous 5-FU therapy Monday to Friday instead of taking oral pills.  Xeloda would be expected to have more nausea skin rash and diarrhea as compared to IV 5-FU therapy.  Discussed risks and benefits of 5-FU chemotherapy including all but not limited to nausea vomiting low blood counts risk of infections and hospitalization.  Treatment will be given with a curative intent.  They prefer IV 5FU.  I will plan for port placement for her.  Open-label phase III trial (PRODIGE 23), TNT  using neoadjuvant chemotherapy with FOLFIRINOX and conventionally fractionated (ie, long-course) neoadjuvant CRT (followed by surgery and adjuvant chemotherapy) improved pCR rates, DFS, and OS relative to neoadjuvant CRT (followed by surgery and adjuvant chemotherapy). In this trial, 461 patients with clinical T3 or T4 rectal cancer <15 cm from the anal verge  were randomly assigned to either TNT followed by surgery versus chemort- surgery- chemo. At interim analysis (median follow-up of 47 months), relative to neoadjuvant CRT, TNT improved DFS (three-year DFS 76 versus 69 percent, hazard ratio [HR] 0.69, 95% CI 0.49-0.97) and pCR rates (28 versus 12 percent) in the entire study population.  DFS benefit was also seen across clinically relevant subgroups, including patients with clinical T4 disease, node-positive disease, low-lying rectal tumors, extramural tumor invasion, and an involved or threatened mesorectal fascia (predicted lateral surgical margin <=1 cm). For the entire study population, OS was similar between the two treatment arms (three-year OS 91 versus 88 percent, stratified HR 0.65, 95% CI 0.40-1.05). In extended follow-up (median of 82 months), TNT and neoadjuvant CRT resulted in seven-year DFS of 68 versus 62 percent, respectively (restricted mean survival time [RMST] difference of 5.7 months, p = 0.05), and seven-year OS of 82 versus 76 percent, respectively (RMST difference 4.4 months, p = 0.03)  Patient is agreeable to seeing Dr. Camelia for consideration for radiation therapy.  I would prefer chemoradiation first before chemotherapy due to low-lying tumor.  Following concurrent chemoradiation consideration will be given to neoadjuvant FOLFOX chemotherapy for 3 to 4 months followed by surgery - Referred to [facility] colorectal  for consultation. - Scheduled appointment with radiation oncologist. - Arrange port placement for IV chemotherapy. - Start chemoradiation with IV 5FU tentatively on December 1st, 2025. - Coordinate with [facility] for potential surgery post-TNT - Monitor for chemoradiation side effects and manage with anti-nausea medications. - Plan periodic scans every six months post-surgery to monitor for recurrence.   I will reach out to the patient after her appointment with colorectal surgery at [facility] on 10/01/2024      Cancer Staging   Cancer of left breast [facility]) Staging form: Breast, AJCC 7th Edition - Clinical: Stage IA (T1c, N0, M0) - Unsigned Laterality: Left  Rectal cancer [facility]) Staging form: Colon and Rectum, AJCC 8th Edition - Clinical stage from 09/30/2024: Stage IIIB (cT3, cN1a, cM0) - Signed by Melanee Annah BROCKS, MD on 09/30/2024 Histopathologic type: Adenocarcinoma, NOS Total positive nodes: 1   Assessment & Plan      Visit Diagnosis 1. Rectal cancer [facility])      Dr. Annah Melanee, MD, MPH [facility] [phone] 09/30/2024 2:42 PM

## 2024-10-01 ENCOUNTER — Other Ambulatory Visit: Payer: Self-pay

## 2024-10-01 ENCOUNTER — Telehealth: Payer: Self-pay

## 2024-10-01 NOTE — Telephone Encounter (Signed)
 Spoke with Ms. Bialy after her appointment with surgeon today. She reports they are having a different read on MRI and she may get upfront surgery. She will have her case presented at tumor board one week from tomorrow. Until then she would like radiation consult postponed and port placement cancelled. Dr. Melanee notified.

## 2024-10-03 ENCOUNTER — Ambulatory Visit: Admitting: Radiation Oncology

## 2024-10-03 ENCOUNTER — Inpatient Hospital Stay (HOSPITAL_BASED_OUTPATIENT_CLINIC_OR_DEPARTMENT_OTHER): Admitting: Hospice and Palliative Medicine

## 2024-10-03 DIAGNOSIS — C20 Malignant neoplasm of rectum: Secondary | ICD-10-CM

## 2024-10-03 NOTE — Progress Notes (Signed)
 Multidisciplinary Oncology Council Documentation  ALAYZIA PAVLOCK was presented by our [facility] on 10/03/2024, which included representatives from:  Palliative Care Dietitian  Physical/Occupational Therapist Nurse Navigator Genetics Social work Survivorship RN Financial Navigator Research RN   Alonnah currently presents with history of rectal cancer  We reviewed previous medical and familial history, history of present illness, and recent lab results along with all available histopathologic and imaging studies. The MOC considered available treatment options and made the following recommendations/referrals:  SW  The MOC is a meeting of clinicians from various specialty areas who evaluate and discuss patients for whom a multidisciplinary approach is being considered. Final determinations in the plan of care are those of the provider(s).   Today's extended care, comprehensive team conference, Edris was not present for the discussion and was not examined.

## 2024-10-04 ENCOUNTER — Inpatient Hospital Stay

## 2024-10-05 ENCOUNTER — Inpatient Hospital Stay

## 2024-10-05 ENCOUNTER — Ambulatory Visit: Admitting: Radiology

## 2024-10-05 NOTE — Progress Notes (Signed)
 [facility] Clinical Social Work  Clinical Social Work was referred by [facility] for assessment of psychosocial needs.  Clinical Social Worker contacted patient by phone to offer support and assess for needs.  Patient reports having no needs at the moment.  She feels like she is in a holding pattern, waiting for the treatment plan.  She said she had breast cancer previously and knows what to expect.  Interventions: Provided patient with information about CSW role.       Follow Up Plan:  CSW will follow-up with patient by phone     Macario CHRISTELLA Au, LCSW  Clinical Social Worker [facility]

## 2024-10-09 ENCOUNTER — Telehealth: Payer: Self-pay

## 2024-10-09 ENCOUNTER — Ambulatory Visit: Admitting: Radiation Oncology

## 2024-10-09 NOTE — Telephone Encounter (Signed)
 Received call from Ms. Kathy Wood. Her case was presented at [facility] tumor board and they are recommending TNT. Note is not available at this time. Ms. Kathy Wood has requested her radiation consult and port be rescheduled. Radiation appointment arranged for 12/9 at 1300 and IR notified for port reschedule, which is pending.

## 2024-10-16 ENCOUNTER — Telehealth: Payer: Self-pay

## 2024-10-16 NOTE — Telephone Encounter (Signed)
 Spoke with Ms. Terris and notified regarding appointment change for radiation consult to 12/3 at 1400. Pending chemo education class and port placement.

## 2024-10-17 ENCOUNTER — Encounter: Payer: Self-pay | Admitting: Radiation Oncology

## 2024-10-17 ENCOUNTER — Ambulatory Visit
Admission: RE | Admit: 2024-10-17 | Discharge: 2024-10-17 | Attending: Radiation Oncology | Admitting: Radiation Oncology

## 2024-10-17 VITALS — BP 166/76 | HR 76 | Resp 15 | Ht 64.0 in | Wt 174.7 lb

## 2024-10-17 DIAGNOSIS — C20 Malignant neoplasm of rectum: Secondary | ICD-10-CM

## 2024-10-17 NOTE — Consult Note (Signed)
 NEW PATIENT EVALUATION  Name: Kathy Wood  MRN: [id number]  Date:   10/17/2024     DOB: 1951-09-08   This 73 y.o. 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  REFERRING PHYSICIAN: Melanee Annah BROCKS, MD  CHIEF COMPLAINT:  Chief Complaint  Patient presents with   Rectal Cancer    DIAGNOSIS: The encounter diagnosis was Rectal cancer (HCC).   PREVIOUS INVESTIGATIONS:  MRI scan reviewed and CT scan reviewed Clinical notes reviewed Pathology reports reviewed  HPI: Patient is a 73 year old female prior patient of mine previously treated for breast cancer.  She noticed blood per rectum waited 2 months and had a colonoscopy at which time there was noted a mass 3 to 5 cm from the anus noncircumferential.  Biopsy was positive for moderately to poorly differentiated adenocarcinoma.  MRI scan was performed showing a T3a N1 rectal adenocarcinoma.  There was possible involvement of the right levator ani muscle.  No anal sphincter involvement was noted she was seen at [facility] and pathology and MRI findings confirmed.  Tumor board recommendation was for concurrent chemoradiation followed by chemotherapy.  She has seen surgeon and will revisit them when we have completed treatments for assessment.  She is seen today and doing well she states has not seen rectal bleeding over the past several weeks.  She is having no rectal pain on defecation.  No increased lower urinary tract symptoms. 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    PAST SURGICAL HISTORY:  Past Surgical History:  Procedure Laterality Date   AUGMENTATION MAMMAPLASTY Left 2010   mastectomy   BREAST LUMPECTOMY Left 2006   positive   CARDIAC CATHETERIZATION  11/29/2018   [facility]   CARDIAC VALVE REPLACEMENT  12/13/2018   [facility]. Aortic Valve.   CATARACT EXTRACTION W/PHACO Left 05/22/2019   Procedure: CATARACT EXTRACTION PHACO AND INTRAOCULAR LENS PLACEMENT (IOC) left;  Surgeon: Jaye Fallow, MD;  Location: [facility];  Service: Ophthalmology;  Laterality: Left;   CATARACT EXTRACTION W/PHACO Right 06/12/2019   Procedure: CATARACT EXTRACTION PHACO AND INTRAOCULAR LENS PLACEMENT (IOC)  RIGHT;  Surgeon: Jaye Fallow, MD;  Location: [facility];  Service: Ophthalmology;  Laterality: Right;   COLONOSCOPY N/A 09/03/2024   Procedure: COLONOSCOPY;  Surgeon: Therisa Bi, MD;  Location: [facility] END[facility];  Service: Gastroenterology;  Laterality: N/A;  ANTIBIOTICS BEFORE PROCEDURE PER OFFICE   COLONOSCOPY WITH PROPOFOL  N/A 10/29/2016   Procedure: COLONOSCOPY WITH PROPOFOL ;  Surgeon: Gladis RAYMOND Mariner, MD;  Location: [facility] END[facility]COPY;  Service: Endoscopy;  Laterality: N/A;   EYE SURGERY     INSERT / REPLACE / REMOVE PACEMAKER  12/15/2018   [facility].  [employer] Azure Xt Dr Levada Luwana [id number]   MASTECTOMY Left 2010   RECONSTRUCTIVE REPAIR STERNAL Left    REDUCTION MAMMAPLASTY Right 2010    FAMILY HISTORY: family history is not on file.  SOCIAL HISTORY:  reports that she has never smoked. She has never used smokeless tobacco. She reports current alcohol use. She reports that she does not use drugs.  ALLERGIES: Taxotere [docetaxel]  MEDICATIONS:  Current Outpatient Medications  Medication Sig Dispense Refill   alendronate (FOSAMAX) 70 MG tablet Take 70 mg by mouth once a week. (Patient not taking: Reported on 09/03/2024)     Aspirin 81 MG CAPS Take by  oral route.     calcium carbonate (OS-CAL) 600 MG TABS tablet Take 600 mg by mouth daily.     Cholecalciferol (VITAMIN D3) 25 MCG (1000 UT) CAPS Take by mouth.     levothyroxine (SYNTHROID, LEVOTHROID) 75 MCG tablet Take 75 mcg by mouth daily before breakfast.     metoprolol succinate (TOPROL-XL) 25 MG 24 hr tablet Take 25 mg by mouth daily. (Patient not taking: Reported on 09/05/2024)     metoprolol tartrate (LOPRESSOR) 25 MG tablet Take 12.5 mg by mouth 2 (two) times daily.     Multiple Vitamin (MULTIVITAMIN) tablet Take 1 tablet by mouth daily.     polyethylene glycol-electrolytes (NULYTELY) 420 g solution as directed. (Patient not taking: Reported on 09/05/2024)     pravastatin (PRAVACHOL) 40 MG tablet Take 40 mg by mouth daily.     No current facility-administered medications for this encounter.    ECOG PERFORMANCE STATUS:  1 - Symptomatic but completely ambulatory  REVIEW OF SYSTEMS: Patient has history of breast cancer Patient denies any weight loss, fatigue, weakness, fever, chills or night sweats. Patient denies any loss of vision, blurred vision. Patient denies any ringing  of the ears or hearing loss. No irregular heartbeat. Patient denies heart murmur or history of fainting. Patient denies any chest pain or pain radiating to her upper extremities. Patient denies any shortness of breath, difficulty breathing at night, cough or hemoptysis. Patient denies any swelling in the lower legs. Patient denies any nausea vomiting, vomiting of blood, or coffee ground material in the vomitus. Patient denies any stomach pain. Patient states has had normal bowel movements no significant constipation or diarrhea. Patient denies any dysuria, hematuria or significant nocturia. Patient denies any problems walking, swelling in the joints or loss of balance. Patient denies any skin changes, loss of hair or loss of weight. Patient denies any excessive worrying or anxiety or significant depression. Patient  denies any problems with insomnia. Patient denies excessive thirst, polyuria, polydipsia. Patient denies any swollen glands, patient denies easy bruising or easy bleeding. Patient denies any recent infections, allergies or URI. Patient s visual fields have not changed significantly in recent time.   PHYSICAL EXAM: BP (!) 166/76   Pulse 76   Resp 15   Ht 5' 4 (1.626 m)   Wt 174 lb 11.2 oz (79.2 kg)   BMI 29.99 kg/m  Well-developed well-nourished patient in NAD. HEENT reveals PERLA, EOMI, discs not visualized.  Oral cavity is clear. No oral mucosal lesions are identified. Neck is clear without evidence of cervical or supraclavicular adenopathy. Lungs are clear to A&P. Cardiac examination is essentially unremarkable with regular rate and rhythm without murmur rub or thrill. Abdomen is benign with no organomegaly or masses noted. Motor sensory and DTR levels are equal and symmetric in the upper and lower extremities. Cranial nerves II through XII are grossly intact. Proprioception is intact. No peripheral adenopathy or edema is identified. No motor or sensory levels are noted. Crude visual fields are within normal range.  LABORATORY DATA: Pathology reports reviewed    RADIOLOGY RESULTS: MRI and CT scans reviewed compatible with the above-stated findings CT scan shows no evidence of metastatic disease   IMPRESSION: Stage IIIb moderate to poorly differentiated adenocarcinoma of the rectum in 73 year old  female  PLAN: This time elected ahead with concurrent chemoradiation therapy.  Would plan on delivering 45 Gray to her whole pelvis boosting the area of tumor involvement up to 55 Gray using 2 boost fields.  Risks and benefits of treatment including increased diarrhea possible increased lower Neri tract symptoms fatigue alteration blood counts skin reaction all were reviewed in detail with the patient.  I personally set up and ordered CT simulation for early next week.  Patient comprehends my  recommendations well.  Will coordinate her chemotherapy with medical oncology.  There will be extra effort by both professional staff as well as technical staff to coordinate and manage concurrent chemoradiation and ensuing side effects during our treatments.  I would like to take this opportunity to thank you for allowing me to participate in the care of your patient.SABRA Marcey Penton, MD

## 2024-10-18 ENCOUNTER — Telehealth: Payer: Self-pay

## 2024-10-18 ENCOUNTER — Inpatient Hospital Stay: Attending: Oncology

## 2024-10-18 DIAGNOSIS — D701 Agranulocytosis secondary to cancer chemotherapy: Secondary | ICD-10-CM | POA: Insufficient documentation

## 2024-10-18 DIAGNOSIS — Z923 Personal history of irradiation: Secondary | ICD-10-CM | POA: Insufficient documentation

## 2024-10-18 DIAGNOSIS — T451X5A Adverse effect of antineoplastic and immunosuppressive drugs, initial encounter: Secondary | ICD-10-CM | POA: Insufficient documentation

## 2024-10-18 DIAGNOSIS — Z9221 Personal history of antineoplastic chemotherapy: Secondary | ICD-10-CM | POA: Insufficient documentation

## 2024-10-18 DIAGNOSIS — Z853 Personal history of malignant neoplasm of breast: Secondary | ICD-10-CM | POA: Insufficient documentation

## 2024-10-18 DIAGNOSIS — C20 Malignant neoplasm of rectum: Secondary | ICD-10-CM | POA: Insufficient documentation

## 2024-10-18 DIAGNOSIS — Z950 Presence of cardiac pacemaker: Secondary | ICD-10-CM | POA: Insufficient documentation

## 2024-10-18 DIAGNOSIS — Z5111 Encounter for antineoplastic chemotherapy: Secondary | ICD-10-CM | POA: Insufficient documentation

## 2024-10-18 DIAGNOSIS — Z85828 Personal history of other malignant neoplasm of skin: Secondary | ICD-10-CM | POA: Insufficient documentation

## 2024-10-18 NOTE — Telephone Encounter (Signed)
 Port a cath placement scheduled for October 25, 2024  Arrive at 1200  for 1300 appointment Come into the Heart and Vascular entrance at [facility]. This entrance is located in the front of the hospital Do not eat or drink anything for 6 hours prior to procedure time. Take your regularly scheduled blood pressure, pain, or seizure medication You do not need to hold you blood thinners You will need a driver for this procedure   Called and reviewed instructions with Kathy Wood. All questions answered.

## 2024-10-22 ENCOUNTER — Ambulatory Visit
Admission: RE | Admit: 2024-10-22 | Discharge: 2024-10-22 | Attending: Radiation Oncology | Admitting: Radiation Oncology

## 2024-10-22 DIAGNOSIS — Z510 Encounter for antineoplastic radiation therapy: Secondary | ICD-10-CM | POA: Diagnosis present

## 2024-10-22 DIAGNOSIS — C20 Malignant neoplasm of rectum: Secondary | ICD-10-CM | POA: Diagnosis present

## 2024-10-23 ENCOUNTER — Ambulatory Visit: Admitting: Dermatology

## 2024-10-23 ENCOUNTER — Ambulatory Visit: Payer: Medicare HMO | Admitting: Dermatology

## 2024-10-23 ENCOUNTER — Encounter: Payer: Self-pay | Admitting: Dermatology

## 2024-10-23 ENCOUNTER — Ambulatory Visit: Admitting: Radiation Oncology

## 2024-10-23 DIAGNOSIS — L570 Actinic keratosis: Secondary | ICD-10-CM

## 2024-10-23 NOTE — Patient Instructions (Addendum)

## 2024-10-23 NOTE — Progress Notes (Unsigned)
 Follow-Up Visit   Subjective  Kathy Wood is a 73 y.o. 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 Corey 11/26/24 and is wondering if she still needs surgery, Recent Rectal Cancer diagnosis and will be starting radiation and chemo in a week   The patient presents for Total-Body Skin Exam (TBSE) for skin cancer screening and mole check. The patient has spots, moles and lesions to be evaluated, some may be new or changing and the patient may have concern these could be cancer.    The following portions of the chart were reviewed this encounter and updated as appropriate: medications, allergies, medical history  Review of Systems:  No other skin or systemic complaints except as noted in HPI or Assessment and Plan.  Objective  Well appearing patient in no apparent distress; mood and affect are within normal limits.  A full examination was performed including scalp, head, eyes, ears, nose, lips, neck, chest, axillae, abdomen, back, buttocks, bilateral upper extremities, bilateral lower extremities, hands, feet, fingers, toes, fingernails, and toenails. All findings within normal limits unless otherwise noted below.   Relevant physical exam findings are noted in the Assessment and Plan.  R nose x 1 Pink scaly macules  Assessment & Plan   SKIN CANCER SCREENING PERFORMED TODAY.  ACTINIC DAMAGE - Chronic condition, secondary to cumulative UV/sun exposure - diffuse scaly erythematous macules with underlying dyspigmentation - Recommend daily broad spectrum sunscreen SPF 30+ to sun-exposed areas, reapply every 2 hours as needed.  - Staying in the shade or wearing long sleeves, sun glasses (UVA+UVB protection) and wide brim hats (4-inch brim around the entire circumference of the hat) are also recommended for sun  protection.  - Call for new or changing lesions.  LENTIGINES, SEBORRHEIC KERATOSES, HEMANGIOMAS - Benign normal skin lesions - Benign-appearing - Call for any changes  MELANOCYTIC NEVI - Tan-brown and/or pink-flesh-colored symmetric macules and papules - Benign appearing on exam today - Observation - Call clinic for new or changing moles - Recommend daily use of broad spectrum spf 30+ sunscreen to sun-exposed areas.   HISTORY OF BASAL CELL CARCINOMA OF THE SKIN - No evidence of recurrence today - Recommend regular full body skin exams - Recommend daily broad spectrum sunscreen SPF 30+ to sun-exposed areas, reapply every 2 hours as needed.  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 Paci 11/26/24 Recommend patient keep appointment with Dr. Corey to discuss treatment  RECTAL CANCER Exam:   Treatment Plan: Patient will be starting radiation and chemotherapy next week.  HISTORY OF BREAST CANCER L breast Exam: clear to visual exam, no lymphadenopathy  Treatment Plan: Observe for changes  AK (ACTINIC KERATOSIS) R nose x 1 Actinic keratoses are precancerous spots that appear secondary to cumulative UV radiation exposure/sun exposure over time. They are chronic with expected duration over 1 year. A portion of actinic keratoses will progress to squamous cell carcinoma of the skin. It is not possible to reliably predict which spots will progress to skin cancer and so treatment is recommended to prevent development of skin cancer.  Recommend daily broad spectrum sunscreen SPF 30+ to sun-exposed  areas, reapply every 2 hours as needed.  Recommend staying in the shade or wearing long sleeves, sun glasses (UVA+UVB protection) and wide brim hats (4-inch brim around the entire  circumference of the hat). Call for new or changing lesions. Destruction of lesion - R nose x 1 Complexity: simple   Destruction method: cryotherapy   Informed consent: discussed and consent obtained   Timeout:  patient name, date of birth, surgical site, and procedure verified Lesion destroyed using liquid nitrogen: Yes   Region frozen until ice ball extended beyond lesion: Yes   Outcome: patient tolerated procedure well with no complications   Post-procedure details: wound care instructions given    Return in about 1 year (around 10/23/2025) for TBSE, Hx of BCC, Hx of AKs.  I, Grayce Saunas, RMA, am acting as scribe for Alm Rhyme, MD .   Documentation: I have reviewed the above documentation for accuracy and completeness, and I agree with the above.  Alm Rhyme, MD

## 2024-10-24 ENCOUNTER — Other Ambulatory Visit: Payer: Self-pay | Admitting: Oncology

## 2024-10-24 ENCOUNTER — Encounter: Payer: Self-pay | Admitting: Oncology

## 2024-10-24 ENCOUNTER — Encounter: Payer: Self-pay | Admitting: Dermatology

## 2024-10-24 ENCOUNTER — Other Ambulatory Visit: Payer: Self-pay | Admitting: Radiology

## 2024-10-24 DIAGNOSIS — C20 Malignant neoplasm of rectum: Secondary | ICD-10-CM

## 2024-10-24 MED ORDER — ONDANSETRON HCL 8 MG PO TABS: 8.0000 mg | ORAL_TABLET | Freq: Three times a day (TID) | ORAL | 1 refills | Status: DC | PRN

## 2024-10-24 MED ORDER — PROCHLORPERAZINE MALEATE 10 MG PO TABS: 10.0000 mg | ORAL_TABLET | Freq: Four times a day (QID) | ORAL | 1 refills | Status: DC | PRN

## 2024-10-24 MED ORDER — LIDOCAINE-PRILOCAINE 2.5-2.5 % EX CREA: TOPICAL_CREAM | CUTANEOUS | 3 refills | Status: DC

## 2024-10-24 NOTE — Progress Notes (Signed)
 Pharmacist Chemotherapy Monitoring - Initial Assessment    Anticipated start date: 10/29/24   The following has been reviewed per standard work regarding the patient's treatment regimen: The patient's diagnosis, treatment plan and drug doses, and organ/hematologic function Lab orders and baseline tests specific to treatment regimen  The treatment plan start date, drug sequencing, and pre-medications Prior authorization status  Patient's documented medication list, including drug-drug interaction screen and prescriptions for anti-emetics and supportive care specific to the treatment regimen The drug concentrations, fluid compatibility, administration routes, and timing of the medications to be used The patient's access for treatment and lifetime cumulative dose history, if applicable  The patient's medication allergies and previous infusion related reactions, if applicable  Rectal cancer (HCC) Stage IIIB (cT3, cN1a, cM0)  Infusional 5-FU would involve continuous 5-FU therapy Monday to Friday  Changes made to treatment plan:  N/A  Follow up needed:  Pending authorization for treatment    Redell JINNY Gaskins, [facility], 10/24/2024  4:02 PM

## 2024-10-24 NOTE — Progress Notes (Signed)
 Patient for IR Port Insertion on Thurs 10/25/24, I called and spoke with the patient on the phone and gave pre-procedure instructions. Pt was made aware to be here at 12:30p, NPO after MN prior to procedure as well as driver post procedure/recovery/discharge. Pt stated understanding.  Called 10/24/24

## 2024-10-25 ENCOUNTER — Encounter: Payer: Self-pay | Admitting: Radiology

## 2024-10-25 ENCOUNTER — Other Ambulatory Visit: Payer: Self-pay

## 2024-10-25 ENCOUNTER — Ambulatory Visit: Admission: RE | Admit: 2024-10-25 | Discharge: 2024-10-25 | Attending: Oncology | Admitting: Radiology

## 2024-10-25 DIAGNOSIS — Z79899 Other long term (current) drug therapy: Secondary | ICD-10-CM | POA: Diagnosis not present

## 2024-10-25 DIAGNOSIS — Z9012 Acquired absence of left breast and nipple: Secondary | ICD-10-CM | POA: Diagnosis not present

## 2024-10-25 DIAGNOSIS — C20 Malignant neoplasm of rectum: Secondary | ICD-10-CM | POA: Diagnosis present

## 2024-10-25 DIAGNOSIS — Z85828 Personal history of other malignant neoplasm of skin: Secondary | ICD-10-CM | POA: Diagnosis not present

## 2024-10-25 DIAGNOSIS — Z950 Presence of cardiac pacemaker: Secondary | ICD-10-CM | POA: Diagnosis not present

## 2024-10-25 DIAGNOSIS — Z7982 Long term (current) use of aspirin: Secondary | ICD-10-CM | POA: Diagnosis not present

## 2024-10-25 DIAGNOSIS — Z9221 Personal history of antineoplastic chemotherapy: Secondary | ICD-10-CM | POA: Diagnosis not present

## 2024-10-25 DIAGNOSIS — I442 Atrioventricular block, complete: Secondary | ICD-10-CM | POA: Diagnosis not present

## 2024-10-25 DIAGNOSIS — Z853 Personal history of malignant neoplasm of breast: Secondary | ICD-10-CM | POA: Diagnosis not present

## 2024-10-25 DIAGNOSIS — Z952 Presence of prosthetic heart valve: Secondary | ICD-10-CM | POA: Diagnosis not present

## 2024-10-25 DIAGNOSIS — Z510 Encounter for antineoplastic radiation therapy: Secondary | ICD-10-CM | POA: Diagnosis not present

## 2024-10-25 HISTORY — PX: IR IMAGING GUIDED PORT INSERTION: IMG5740

## 2024-10-25 MED ORDER — MIDAZOLAM HCL (PF) 2 MG/2ML IJ SOLN
INTRAMUSCULAR | Status: AC | PRN
  Administered 2024-10-25 (×2): 1 mg via INTRAVENOUS

## 2024-10-25 MED ORDER — HEPARIN SOD (PORK) LOCK FLUSH 100 UNIT/ML IV SOLN
INTRAVENOUS | Status: AC
  Filled 2024-10-25: qty 5

## 2024-10-25 MED ORDER — FENTANYL CITRATE (PF) 100 MCG/2ML IJ SOLN
INTRAMUSCULAR | Status: AC | PRN
  Administered 2024-10-25 (×2): 50 ug via INTRAVENOUS

## 2024-10-25 MED ORDER — FENTANYL CITRATE (PF) 100 MCG/2ML IJ SOLN
INTRAMUSCULAR | Status: AC
  Filled 2024-10-25: qty 2

## 2024-10-25 MED ORDER — HEPARIN SOD (PORK) LOCK FLUSH 100 UNIT/ML IV SOLN
500.0000 [IU] | Freq: Once | INTRAVENOUS | Status: AC
  Administered 2024-10-25: 500 [IU] via INTRAVENOUS

## 2024-10-25 MED ORDER — SODIUM CHLORIDE 0.9 % IV SOLN: INTRAVENOUS | Status: DC

## 2024-10-25 MED ORDER — MIDAZOLAM HCL 2 MG/2ML IJ SOLN
INTRAMUSCULAR | Status: AC
  Filled 2024-10-25: qty 2

## 2024-10-25 MED ORDER — LIDOCAINE-EPINEPHRINE 1 %-1:100000 IJ SOLN
INTRAMUSCULAR | Status: AC
  Filled 2024-10-25: qty 1

## 2024-10-25 MED ORDER — LIDOCAINE-EPINEPHRINE 1 %-1:100000 IJ SOLN
20.0000 mL | Freq: Once | INTRAMUSCULAR | Status: AC
  Administered 2024-10-25: 20 mL via INTRADERMAL

## 2024-10-25 NOTE — Procedures (Signed)
Interventional Radiology Procedure Note  Procedure: Single Lumen Power Port Placement    Access:  Left internal jugular vein  Findings: Catheter tip positioned at cavoatrial junction. Port is ready for immediate use.   Complications: None  EBL: < 10 mL  Recommendations:  - Ok to shower in 24 hours - Do not submerge for 7 days - Routine line care    Graylee Arutyunyan, MD   

## 2024-10-25 NOTE — Progress Notes (Signed)
 Patient clinically stable post IR Port placement per Dr Jennefer, tolerated well. Vitals stable post procedure.received Versed  2 mg along with Fentanyl  100 mcg IV for procedure. Report given to Donny Cedar RN post procedure/specials/13

## 2024-10-25 NOTE — H&P (Signed)
 Chief Complaint:  Rectal Cancer  Procedure: Port-a-catheter insertion   Referring Provider(s): Dr. Annah Skene  Supervising Physician: Jennefer Rover  Patient Status: [facility] - Out-pt  History of Present Illness: Kathy Wood is a 73 y.o. 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 Patient recently underwent workup with GI due to rectal bleeding. She underwent colonoscopy on 09/03/24 which revealed a 2cm polypoid non-obstructing mass in the distal rectum. Biopsy unfortunately returned with moderately to poorly differentiated adenocarcinoma. Follow up imaging for staging revealed  T3aN1 with penetration beyond the muscularis and one involved lymph node with no evidence of distant metastases. Patient currently has plan to undergo concurrent chemoradiation followed by chemotherapy and then evaluation for possible surgical resection. She presents today for her port-a-catheter placement for chemotherapy.  Patient is currently resting in bed in no acute distress. States that she has been feeling great and has been having no symptoms. Reports the last time she noted blood in her stool was prior to her colonoscopy. She denies any constipation, diarrhea, nausea/vomiting, abdominal pain, changes in weight, chest pain, shortness of breath, or fevers/chills. NPO since midnight. All questions and concerns answered at the bedside.   Patient is Full Code  Past Medical History:  Diagnosis Date   Actinic keratosis    Aortic valvar stenosis    Valve replacement. [facility]. 12/13/18   Basal cell carcinoma    Nasolabial and L eyelid area txted yrs ago per patient   Breast cancer [facility]) 2006   left breast/ DCIS   Breast cancer (HCC) 2009   left breast/ triple NEG   Cancer of left breast (HCC) 2009   Hyperlipidemia    Hypothyroidism     Osteoporosis    Personal history of chemotherapy 2009   Personal history of radiation therapy 2006   Presence of permanent cardiac pacemaker 12/15/2018   [facility]. Medtronic Azure Xt Dr Levada Pauling. [id number]   Rectal cancer [facility])     Past Surgical History:  Procedure Laterality Date   AUGMENTATION MAMMAPLASTY Left 2010   mastectomy   BREAST LUMPECTOMY Left 2006   positive   CARDIAC CATHETERIZATION  11/29/2018   [facility]   CARDIAC VALVE REPLACEMENT  12/13/2018   [facility]. Aortic Valve.   CATARACT EXTRACTION W/PHACO Left 05/22/2019   Procedure: CATARACT EXTRACTION PHACO AND INTRAOCULAR LENS PLACEMENT (IOC) left;  Surgeon: Jaye Fallow, MD;  Location: [facility];  Service: Ophthalmology;  Laterality: Left;   CATARACT EXTRACTION W/PHACO Right 06/12/2019   Procedure: CATARACT EXTRACTION PHACO AND INTRAOCULAR LENS PLACEMENT (IOC)  RIGHT;  Surgeon: Jaye Fallow, MD;  Location: [facility];  Service: Ophthalmology;  Laterality: Right;   COLONOSCOPY N/A 09/03/2024   Procedure: COLONOSCOPY;  Surgeon: Therisa Bi, MD;  Location: [facility];  Service: Gastroenterology;  Laterality: N/A;  ANTIBIOTICS BEFORE PROCEDURE PER OFFICE   COLONOSCOPY WITH PROPOFOL  N/A 10/29/2016   Procedure: COLONOSCOPY WITH PROPOFOL ;  Surgeon: Gladis RAYMOND Mariner, MD;  Location: [facility] ENDOSCOPY;  Service: Endoscopy;  Laterality: N/A;   EYE SURGERY     INSERT / REPLACE / REMOVE PACEMAKER  12/15/2018   [facility].  Medtronic Azure Xt Dr Levada Pauling [id number]   MASTECTOMY Left 2010   RECONSTRUCTIVE REPAIR STERNAL Left    REDUCTION MAMMAPLASTY Right 2010    Allergies: Taxotere [docetaxel]  Medications: Prior to Admission medications  Medication Sig Start Date End Date  Taking? Authorizing Provider  alendronate (FOSAMAX) 70 MG tablet Take 70 mg by mouth once a week. Patient not taking: Reported on 09/03/2024 12/17/18   [provider]  Aspirin 81 MG CAPS Take by oral route.    [provider]  calcium carbonate (OS-CAL) 600 MG TABS tablet Take 600 mg by mouth daily.    [provider]  Cholecalciferol (VITAMIN D3) 25 MCG (1000 UT) CAPS Take by mouth.    [provider]  levothyroxine (SYNTHROID, LEVOTHROID) 75 MCG tablet Take 75 mcg by mouth daily before breakfast. 12/02/18   [provider]  lidocaine -prilocaine (EMLA) cream Apply to affected area once 10/24/24   Rao, Archana C, MD  metoprolol succinate (TOPROL-XL) 25 MG 24 hr tablet Take 25 mg by mouth daily. Patient not taking: Reported on 09/05/2024    [provider]  metoprolol tartrate (LOPRESSOR) 25 MG tablet Take 12.5 mg by mouth 2 (two) times daily.    [provider]  Multiple Vitamin (MULTIVITAMIN) tablet Take 1 tablet by mouth daily.    [provider]  ondansetron (ZOFRAN) 8 MG tablet Take 1 tablet (8 mg total) by mouth every 8 (eight) hours as needed for nausea or vomiting. 10/24/24   Melanee Annah BROCKS, MD  polyethylene glycol-electrolytes (NULYTELY) 420 g solution as directed. Patient not taking: Reported on 09/05/2024 08/15/24   [provider]  pravastatin (PRAVACHOL) 40 MG tablet Take 40 mg by mouth daily.    [provider]  prochlorperazine (COMPAZINE) 10 MG tablet Take 1 tablet (10 mg total) by mouth every 6 (six) hours as needed for nausea or vomiting. 10/24/24   Melanee Annah BROCKS, MD     Family History  Problem Relation Age of Onset   Breast cancer Neg Hx     Social History   Socioeconomic History   Marital status: Married    Spouse name: Kathy Wood   Number of children: 3   Years of education: Not on file   Highest education level: Not on file  Occupational History   Not on file  Tobacco Use   Smoking status: Never   Smokeless tobacco: Never  Vaping Use   Vaping status: Never Used  Substance and Sexual Activity   Alcohol use: Yes    Comment: rare drinker   Drug use: No   Sexual activity: Yes  Other Topics  Concern   Not on file  Social History Narrative   Not on file   Social Drivers of Health   Tobacco Use: Low Risk (10/24/2024)   Patient History    Smoking Tobacco Use: Never    Smokeless Tobacco Use: Never    Passive Exposure: Not on file  Financial Resource Strain: Low Risk  (08/13/2024)   Received from [facility]   Overall Financial Resource Strain (CARDIA)    Difficulty of Paying Living Expenses: Not hard at all  Food Insecurity: No Food Insecurity (09/05/2024)   Epic    Worried About Running Out of Food in the Last Year: Never true    Ran Out of Food in the Last Year: Never true  Transportation Needs: No Transportation Needs (09/05/2024)   Epic    Lack of Transportation (Medical): No    Lack of Transportation (Non-Medical): No  Physical Activity: Not on file  Stress: Not on file  Social Connections: Not on file  Depression (PHQ2-9): Low Risk (09/26/2024)   Depression (PHQ2-9)    PHQ-2 Score: 0  Alcohol Screen: Not on  file  Housing: Low Risk (09/05/2024)   Epic    Unable to Pay for Housing in the Last Year: No    Number of Times Moved in the Last Year: 0    Homeless in the Last Year: No  Utilities: Not At Risk (09/05/2024)   Epic    Threatened with loss of utilities: No  Health Literacy: Not on file    Review of Systems Patient denies any headache, chest pain, shortness of breath, abdominal pain, N/V, or fever/chills. All other systems are negative.   Vital Signs: BP (!) 154/64   Pulse 69   Resp 17   Ht 5' 4 (1.626 m)   Wt 170 lb (77.1 kg)   SpO2 97%   BMI 29.18 kg/m    Physical Exam Vitals reviewed.  Constitutional:      Appearance: Normal appearance.  HENT:     Mouth/Throat:     Mouth: Mucous membranes are moist.     Pharynx: Oropharynx is clear.  Cardiovascular:     Rate and Rhythm: Normal rate and regular rhythm.     Heart sounds: Murmur heard.  Pulmonary:     Effort: Pulmonary effort is normal.     Breath sounds: Normal  breath sounds.  Abdominal:     General: Abdomen is flat.     Palpations: Abdomen is soft.     Tenderness: There is no abdominal tenderness.  Skin:    General: Skin is warm and dry.  Neurological:     Mental Status: She is alert and oriented to person, place, and time.  Psychiatric:        Behavior: Behavior normal.     Imaging: No results found.  Labs:  CBC: Recent Labs    09/05/24 1618  WBC 6.0  HGB 13.8  HCT 41.5  PLT 262    COAGS: No results for input(s): INR, APTT in the last 8760 hours.  BMP: Recent Labs    09/05/24 1618  NA 138  K 4.4  CL 104  CO2 25  GLUCOSE 95  BUN 17  CALCIUM 9.5  CREATININE 0.81  GFRNONAA >60    LIVER FUNCTION TESTS: Recent Labs    09/05/24 1618  BILITOT 0.5  AST 23  ALT 19  ALKPHOS 69  PROT 7.7  ALBUMIN 4.2    TUMOR MARKERS: No results for input(s): AFPTM, CEA, CA199, CHROMGRNA in the last 8760 hours.  Assessment and Plan:  Rectal Cancer: Kathy Wood is a 73 y.o. female with a history of breast cancer s/p treatment, complete heart block with a right-sided pacemaker, and recent diagnosis of rectal adenocarcinoma who presents to [facility] Interventional Radiology department for an image-guided port-a-catheter insertion with Dr. JONETTA Sides. Procedure to be performed under moderate sedation.  Risks and benefits of image guided port-a-catheter placement was discussed with the patient including, but not limited to bleeding, infection, pneumothorax, or fibrin sheath development and need for additional procedures.  All of the patient's questions were answered, patient is agreeable to proceed. Consent signed and in chart.   Thank you for allowing our service to participate in Kathy Wood 's care.    Electronically Signed: Gerrie Castiglia M Merelyn Klump, PA-C   10/25/2024, 1:24 PM     I spent a total of 30 Minutes in face to face in clinical consultation, greater than 50% of which was counseling/coordinating care  for port-a-catheter insertion.

## 2024-10-25 NOTE — Discharge Instructions (Signed)
 Implanted [facility] Guide  An implanted port is a type of central line that is placed under the skin. Central lines are used to provide IV access when treatment or nutrition needs to be given through a persons veins. Implanted ports are used for long-term IV access. An implanted port may be placed because: You need IV medicine that would be irritating to the small veins in your hands or arms. You need long-term IV medicines, such as antibiotics. You need IV nutrition for a long period. You need frequent blood draws for lab tests. You need dialysis.   Implanted ports are usually placed in the chest area, but they can also be placed in the upper arm, the abdomen, or the leg. An implanted port has two main parts: Reservoir. The reservoir is round and will appear as a small, raised area under your skin. The reservoir is the part where a needle is inserted to give medicines or draw blood. Catheter. The catheter is a thin, flexible tube that extends from the reservoir. The catheter is placed into a large vein. Medicine that is inserted into the reservoir goes into the catheter and then into the vein.   How will I care for my incision  You may shower today no rubbing over site Please remove dressing in 24hrs not other skin care is needed  How is my port accessed? Special steps must be taken to access the port: Before the port is accessed, a numbing cream can be placed on the skin. This helps numb the skin over the port site. Your health care provider uses a sterile technique to access the port. Your health care provider must put on a mask and sterile gloves. The skin over your port is cleaned carefully with an antiseptic and allowed to dry. The port is gently pinched between sterile gloves, and a needle is inserted into the port. Only non-coring port needles should be used to access the port. Once the port is accessed, a blood return should be checked. This helps ensure that the port is in the  vein and is not clogged. If your port needs to remain accessed for a constant infusion, a clear (transparent) bandage will be placed over the needle site. The bandage and needle will need to be changed every week, or as directed by your health care provider.   What is flushing? Flushing helps keep the port from getting clogged. Follow your health care providers instructions on how and when to flush the port. Ports are usually flushed with saline solution or a medicine called heparin. The need for flushing will depend on how the port is used. If the port is used for intermittent medicines or blood draws, the port will need to be flushed: After medicines have been given. After blood has been drawn. As part of routine maintenance. If a constant infusion is running, the port may not need to be flushed.   How long will my port stay implanted? The port can stay in for as long as your health care provider thinks it is needed. When it is time for the port to come out, surgery will be done to remove it. The procedure is similar to the one performed when the port was put in. When should I seek immediate medical care? When you have an implanted port, you should seek immediate medical care if: You notice a bad smell coming from the incision site. You have swelling, redness, or drainage at the incision site. You have more swelling  or pain at the port site or the surrounding area. You have a fever that is not controlled with medicine.   This information is not intended to replace advice given to you by your health care provider. Make sure you discuss any questions you have with your health care provider. Document Released: 11/01/2005 Document Revised: 04/08/2016 Document Reviewed: 07/09/2013 Elsevier Interactive Patient Education  2017 [employer]  Implanted [facility] Guide  An implanted port is a type of central line that is placed under the skin. Central lines are used to provide IV access when  treatment or nutrition needs to be given through a persons veins. Implanted ports are used for long-term IV access. An implanted port may be placed because: You need IV medicine that would be irritating to the small veins in your hands or arms. You need long-term IV medicines, such as antibiotics. You need IV nutrition for a long period. You need frequent blood draws for lab tests. You need dialysis.   Implanted ports are usually placed in the chest area, but they can also be placed in the upper arm, the abdomen, or the leg. An implanted port has two main parts: Reservoir. The reservoir is round and will appear as a small, raised area under your skin. The reservoir is the part where a needle is inserted to give medicines or draw blood. Catheter. The catheter is a thin, flexible tube that extends from the reservoir. The catheter is placed into a large vein. Medicine that is inserted into the reservoir goes into the catheter and then into the vein.   How will I care for my incision  You may shower today no rubbing over site Please remove dressing in 24hrs not other skin care is needed  How is my port accessed? Special steps must be taken to access the port: Before the port is accessed, a numbing cream can be placed on the skin. This helps numb the skin over the port site. Your health care provider uses a sterile technique to access the port. Your health care provider must put on a mask and sterile gloves. The skin over your port is cleaned carefully with an antiseptic and allowed to dry. The port is gently pinched between sterile gloves, and a needle is inserted into the port. Only non-coring port needles should be used to access the port. Once the port is accessed, a blood return should be checked. This helps ensure that the port is in the vein and is not clogged. If your port needs to remain accessed for a constant infusion, a clear (transparent) bandage will be placed over the needle site.  The bandage and needle will need to be changed every week, or as directed by your health care provider.   What is flushing? Flushing helps keep the port from getting clogged. Follow your health care providers instructions on how and when to flush the port. Ports are usually flushed with saline solution or a medicine called heparin. The need for flushing will depend on how the port is used. If the port is used for intermittent medicines or blood draws, the port will need to be flushed: After medicines have been given. After blood has been drawn. As part of routine maintenance. If a constant infusion is running, the port may not need to be flushed.   How long will my port stay implanted? The port can stay in for as long as your health care provider thinks it is needed. When it is time for  the port to come out, surgery will be done to remove it. The procedure is similar to the one performed when the port was put in. When should I seek immediate medical care? When you have an implanted port, you should seek immediate medical care if: You notice a bad smell coming from the incision site. You have swelling, redness, or drainage at the incision site. You have more swelling or pain at the port site or the surrounding area. You have a fever that is not controlled with medicine.   This information is not intended to replace advice given to you by your health care provider. Make sure you discuss any questions you have with your health care provider. Document Released: 11/01/2005 Document Revised: 04/08/2016 Document Reviewed: 07/09/2013 Elsevier Interactive Patient Education  2017 [employer].

## 2024-10-26 ENCOUNTER — Other Ambulatory Visit: Payer: Self-pay

## 2024-10-29 ENCOUNTER — Encounter: Payer: Self-pay | Admitting: Oncology

## 2024-10-29 ENCOUNTER — Other Ambulatory Visit: Payer: Self-pay

## 2024-10-29 ENCOUNTER — Telehealth: Payer: Self-pay

## 2024-10-29 ENCOUNTER — Inpatient Hospital Stay

## 2024-10-29 ENCOUNTER — Ambulatory Visit
Admission: RE | Admit: 2024-10-29 | Discharge: 2024-10-29 | Attending: Radiation Oncology | Admitting: Radiation Oncology

## 2024-10-29 ENCOUNTER — Telehealth: Payer: Self-pay | Admitting: Pharmacy Technician

## 2024-10-29 ENCOUNTER — Inpatient Hospital Stay: Admitting: Oncology

## 2024-10-29 ENCOUNTER — Other Ambulatory Visit (HOSPITAL_COMMUNITY): Payer: Self-pay

## 2024-10-29 VITALS — BP 129/65 | HR 76 | Temp 98.6°F | Resp 19 | Ht 64.0 in | Wt 172.4 lb

## 2024-10-29 DIAGNOSIS — C20 Malignant neoplasm of rectum: Secondary | ICD-10-CM

## 2024-10-29 DIAGNOSIS — Z853 Personal history of malignant neoplasm of breast: Secondary | ICD-10-CM | POA: Diagnosis not present

## 2024-10-29 DIAGNOSIS — Z85828 Personal history of other malignant neoplasm of skin: Secondary | ICD-10-CM | POA: Diagnosis not present

## 2024-10-29 DIAGNOSIS — Z950 Presence of cardiac pacemaker: Secondary | ICD-10-CM | POA: Diagnosis not present

## 2024-10-29 DIAGNOSIS — Z95828 Presence of other vascular implants and grafts: Secondary | ICD-10-CM

## 2024-10-29 DIAGNOSIS — Z510 Encounter for antineoplastic radiation therapy: Secondary | ICD-10-CM | POA: Diagnosis not present

## 2024-10-29 DIAGNOSIS — Z5111 Encounter for antineoplastic chemotherapy: Secondary | ICD-10-CM | POA: Diagnosis present

## 2024-10-29 DIAGNOSIS — Z923 Personal history of irradiation: Secondary | ICD-10-CM | POA: Diagnosis not present

## 2024-10-29 DIAGNOSIS — T451X5A Adverse effect of antineoplastic and immunosuppressive drugs, initial encounter: Secondary | ICD-10-CM | POA: Diagnosis not present

## 2024-10-29 DIAGNOSIS — Z9221 Personal history of antineoplastic chemotherapy: Secondary | ICD-10-CM | POA: Diagnosis not present

## 2024-10-29 DIAGNOSIS — D701 Agranulocytosis secondary to cancer chemotherapy: Secondary | ICD-10-CM | POA: Diagnosis not present

## 2024-10-29 LAB — CBC WITH DIFFERENTIAL (CANCER CENTER ONLY)
Abs Immature Granulocytes: 0.04 K/uL (ref 0.00–0.07)
Basophils Absolute: 0.1 K/uL (ref 0.0–0.1)
Basophils Relative: 1 %
Eosinophils Absolute: 0.1 K/uL (ref 0.0–0.5)
Eosinophils Relative: 2 %
HCT: 37.7 % (ref 36.0–46.0)
Hemoglobin: 12.9 g/dL (ref 12.0–15.0)
Immature Granulocytes: 1 %
Lymphocytes Relative: 25 %
Lymphs Abs: 1.7 K/uL (ref 0.7–4.0)
MCH: 32.5 pg (ref 26.0–34.0)
MCHC: 34.2 g/dL (ref 30.0–36.0)
MCV: 95 fL (ref 80.0–100.0)
Monocytes Absolute: 0.5 K/uL (ref 0.1–1.0)
Monocytes Relative: 8 %
Neutro Abs: 4.4 K/uL (ref 1.7–7.7)
Neutrophils Relative %: 63 %
Platelet Count: 230 K/uL (ref 150–400)
RBC: 3.97 MIL/uL (ref 3.87–5.11)
RDW: 12.3 % (ref 11.5–15.5)
WBC Count: 6.9 K/uL (ref 4.0–10.5)
nRBC: 0 % (ref 0.0–0.2)

## 2024-10-29 LAB — CMP (CANCER CENTER ONLY)
ALT: 15 U/L (ref 0–44)
AST: 23 U/L (ref 15–41)
Albumin: 4.3 g/dL (ref 3.5–5.0)
Alkaline Phosphatase: 71 U/L (ref 38–126)
Anion gap: 10 (ref 5–15)
BUN: 19 mg/dL (ref 8–23)
CO2: 24 mmol/L (ref 22–32)
Calcium: 9.9 mg/dL (ref 8.9–10.3)
Chloride: 103 mmol/L (ref 98–111)
Creatinine: 0.78 mg/dL (ref 0.44–1.00)
GFR, Estimated: >60 mL/min (ref >60)
Glucose, Bld: 95 mg/dL (ref 70–99)
Potassium: 4.3 mmol/L (ref 3.5–5.1)
Sodium: 136 mmol/L (ref 135–145)
Total Bilirubin: 0.4 mg/dL (ref 0.0–1.2)
Total Protein: 7.2 g/dL (ref 6.5–8.1)

## 2024-10-29 MED ORDER — LIDOCAINE-PRILOCAINE 2.5-2.5 % EX CREA
TOPICAL_CREAM | CUTANEOUS | 3 refills | Status: DC
  Filled 2024-10-29: qty 30, 1d supply, fill #0

## 2024-10-29 MED ORDER — SODIUM CHLORIDE 0.9 % IV SOLN
2000.0000 mg | INTRAVENOUS | Status: DC
  Administered 2024-10-29: 13:00:00 2000 mg via INTRAVENOUS
  Filled 2024-10-29: qty 40

## 2024-10-29 MED ORDER — LIDOCAINE-PRILOCAINE 2.5-2.5 % EX CREA: TOPICAL_CREAM | CUTANEOUS | 3 refills | Status: DC

## 2024-10-29 NOTE — Progress Notes (Signed)
 Outbound call to [employer], representative indicated cash price is $104.99 but when she tried to rerun it EMLA  cream is requiring a prior authorization; staff message sent to RX auth team.  Outbound call to [facility] pharmacy to get a quote how much it would be there instead, spoke to [facility] $14.57

## 2024-10-29 NOTE — Telephone Encounter (Signed)
 Oral Oncology Patient Advocate Encounter   New authorization   Received notification that prior authorization for lidocaine -prilocaine  (EMLA ) cream is required.   PA submitted on CMM via [employer] [id number] Status is pending     Brealyn Baril (Patty) Chet Burnet, CPhT  [facility], Zelda Salmon, [facility] - Oral Chemotherapy Patient Advocate Specialist III Phone: [phone]  Fax: [phone]

## 2024-10-29 NOTE — Progress Notes (Signed)
Hematology/Oncology Consult note [facility]  Telephone:([phone] [phone]  Patient Care Team: Sadie Manna, MD as PCP - General (Internal Medicine) Maurie Rayfield BIRCH, RN as Oncology Nurse Navigator Melanee Annah BROCKS, MD as Consulting Physician (Oncology)   Name of the patient: Kathy Wood  [id number]  10-09-1951   Date of visit: 10/29/2024  Diagnosis-  Cancer Staging  Cancer of left breast [facility]) Staging form: Breast, AJCC 7th Edition - Clinical: Stage IA (T1c, N0, M0) - Unsigned Laterality: Left  Rectal cancer [facility]) Staging form: Colon and Rectum, AJCC 8th Edition - Clinical stage from 09/30/2024: Stage IIIB (cT3, cN1a, cM0) - Signed by Melanee Annah BROCKS, MD on 09/30/2024 Histopathologic type: Adenocarcinoma, NOS Total positive nodes: 1    Chief complaint/ Reason for visit-on treatment assessment prior to week 1 of infusional 5-FU chemotherapy concurrent with radiation  Heme/Onc history: Kathy Wood is a 73 year old female with a history of breast cancer who presents with rectal bleeding and a newly diagnosed rectal mass. She was referred by [facility] GI NPChristiane London for evaluation of rectal bleeding.   She has experienced intermittent rectal bleeding since the summer, with the initial episode not recurring. Colonoscopy showed nonobstructing medium-sized mass 3 to 5 cm from the anus measuring 2 cm in size.  Biopsy was consistent with moderate to poorly differentiated adenocarcinoma. MSI stable disease.    Her last colonoscopy in 2017 showed no abnormalities, and she has not undergone any other related procedures since then.   She has a history of breast cancer treated with mastectomy and chemotherapy 15 years ago. Genetic testing five years ago did not reveal significant findings. No family history of colon, breast, or stomach cancer.   She denies other symptoms such as pain or pressure.    MRI pelvis on 09/25/2024  showed T3a N1 disease with possible involvement of levator ani musculature.  Distance of the tumor from anal verge 5 cm.  Tumor distance from internal anal sphincter 2.5 cm.  Possible small volume extension at 7 o'clock position less than 1 mm T3a.  High mesorectal lymph node 8 mm. N1.   Patient has been seen by [facility] colorectal surgery and plan is for total neoadjuvant treatment with concurrent chemoradiation with infusional 5-FU followed by FOLFOX chemotherapy for 4 months and consideration for surgery versus wait and watch approach  Interval history-patient has a port in place and overall she feels well presently.  Denies any specific complaints today  ECOG PS- 1 Pain scale- 0   Review of systems- Review of Systems  Constitutional:  Negative for chills, fever, malaise/fatigue and weight loss.  HENT:  Negative for congestion, ear discharge and nosebleeds.   Eyes:  Negative for blurred vision.  Respiratory:  Negative for cough, hemoptysis, sputum production, shortness of breath and wheezing.   Cardiovascular:  Negative for chest pain, palpitations, orthopnea and claudication.  Gastrointestinal:  Negative for abdominal pain, blood in stool, constipation, diarrhea, heartburn, melena, nausea and vomiting.  Genitourinary:  Negative for dysuria, flank pain, frequency, hematuria and urgency.  Musculoskeletal:  Negative for back pain, joint pain and myalgias.  Skin:  Negative for rash.  Neurological:  Negative for dizziness, tingling, focal weakness, seizures, weakness and headaches.  Endo/Heme/Allergies:  Does not bruise/bleed easily.  Psychiatric/Behavioral:  Negative for depression and suicidal ideas. The patient does not have insomnia.       Allergies[1]   Past Medical History:  Diagnosis Date   Actinic keratosis  Aortic valvar stenosis    Valve replacement. [facility]. 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 Medtronic Azure Xt Dr Levada Pauling. [id number]   Rectal cancer [facility])      Past Surgical History:  Procedure Laterality Date   AUGMENTATION MAMMAPLASTY Left 2010   mastectomy   BREAST LUMPECTOMY Left 2006   positive   CARDIAC CATHETERIZATION  11/29/2018   [facility]   CARDIAC VALVE REPLACEMENT  12/13/2018   [facility]. Aortic Valve.   CATARACT EXTRACTION W/PHACO Left 05/22/2019   Procedure: CATARACT EXTRACTION PHACO AND INTRAOCULAR LENS PLACEMENT (IOC) left;  Surgeon: Jaye Fallow, MD;  Location: [facility];  Service: Ophthalmology;  Laterality: Left;   CATARACT EXTRACTION W/PHACO Right 06/12/2019   Procedure: CATARACT EXTRACTION PHACO AND INTRAOCULAR LENS PLACEMENT (IOC)  RIGHT;  Surgeon: Jaye Fallow, MD;  Location: [facility];  Service: Ophthalmology;  Laterality: Right;   COLONOSCOPY N/A 09/03/2024   Procedure: COLONOSCOPY;  Surgeon: Therisa Bi, MD;  Location: [facility] END[facility];  Service: Gastroenterology;  Laterality: N/A;  ANTIBIOTICS BEFORE PROCEDURE PER OFFICE   COLONOSCOPY WITH PROPOFOL  N/A 10/29/2016   Procedure: COLONOSCOPY WITH PROPOFOL ;  Surgeon: Gladis RAYMOND Mariner, MD;  Location: [facility] END[facility]COPY;  Service: Endoscopy;  Laterality: N/A;   EYE SURGERY     INSERT / REPLACE / REMOVE PACEMAKER  12/15/2018   [facility].  Medtronic Azure Xt Dr Levada Pauling [id number]   IR IMAGING GUIDED PORT INSERTION  10/25/2024   MASTECTOMY Left 2010   RECONSTRUCTIVE REPAIR STERNAL Left    REDUCTION MAMMAPLASTY Right 2010    Social History   Socioeconomic History   Marital status: Married    Spouse name: Kisha Messman   Number of children: 3   Years of education: Not on file    Highest education level: Not on file  Occupational History   Not on file  Tobacco Use   Smoking status: Never   Smokeless tobacco: Never  Vaping Use   Vaping status: Never Used  Substance and Sexual Activity   Alcohol use: Yes    Comment: rare drinker   Drug use: No   Sexual activity: Yes  Other Topics Concern   Not on file  Social History Narrative   Not on file   Social Drivers of Health   Tobacco Use: Low Risk (10/29/2024)   Patient History    Smoking Tobacco Use: Never    Smokeless Tobacco Use: Never    Passive Exposure: Not on file  Financial Resource Strain: Low Risk  (08/13/2024)   Received from [facility]   Overall Financial Resource Strain (CARDIA)    Difficulty of Paying Living Expenses: Not hard at all  Food Insecurity: No Food Insecurity (09/05/2024)   Epic    Worried About Radiation Protection Practitioner of Food in the Last Year: Never true    Ran Out of Food in the Last Year: Never true  Transportation Needs: No Transportation Needs (09/05/2024)   Epic    Lack of Transportation (Medical): No    Lack of Transportation (Non-Medical):  No  Physical Activity: Not on file  Stress: Not on file  Social Connections: Not on file  Intimate Partner Violence: Not At Risk (09/05/2024)   Epic    Fear of Current or Ex-Partner: No    Emotionally Abused: No    Physically Abused: No    Sexually Abused: No  Depression (PHQ2-9): Low Risk (10/29/2024)   Depression (PHQ2-9)    PHQ-2 Score: 0  Alcohol Screen: Not on file  Housing: Low Risk (09/05/2024)   Epic    Unable to Pay for Housing in the Last Year: No    Number of Times Moved in the Last Year: 0    Homeless in the Last Year: No  Utilities: Not At Risk (09/05/2024)   Epic    Threatened with loss of utilities: No  Health Literacy: Not on file    Family History  Problem Relation Age of Onset   Breast cancer Neg Hx     Current Medications[2]  Physical exam:  Vitals:   10/29/24 1130  BP: 129/65  Pulse:  76  Resp: 19  Temp: 98.6 F (37 C)  TempSrc: Tympanic  SpO2: 96%  Weight: 172 lb 6.4 oz (78.2 kg)  Height: 5' 4 (1.626 m)   Physical Exam Cardiovascular:     Rate and Rhythm: Normal rate and regular rhythm.     Heart sounds: Normal heart sounds.  Pulmonary:     Effort: Pulmonary effort is normal.     Breath sounds: Normal breath sounds.  Skin:    General: Skin is warm and dry.  Neurological:     Mental Status: She is alert and oriented to person, place, and time.      I have personally reviewed labs listed below:    Latest Ref Rng & Units 10/29/2024   11:10 AM  CMP  Glucose 70 - 99 mg/dL 95   BUN 8 - 23 mg/dL 19   Creatinine 9.55 - 1.00 mg/dL 9.21   Sodium 864 - 854 mmol/L 136   Potassium 3.5 - 5.1 mmol/L 4.3   Chloride 98 - 111 mmol/L 103   CO2 22 - 32 mmol/L 24   Calcium 8.9 - 10.3 mg/dL 9.9   Total Protein 6.5 - 8.1 g/dL 7.2   Total Bilirubin 0.0 - 1.2 mg/dL 0.4   Alkaline Phos 38 - 126 U/L 71   AST 15 - 41 U/L 23   ALT 0 - 44 U/L 15       Latest Ref Rng & Units 10/29/2024   11:10 AM  CBC  WBC 4.0 - 10.5 K/uL 6.9   Hemoglobin 12.0 - 15.0 g/dL 87.0   Hematocrit 63.9 - 46.0 % 37.7   Platelets 150 - 400 K/uL 230    I have personally reviewed Radiology images listed below: No images are attached to the encounter.  IR IMAGING GUIDED PORT INSERTION Result Date: 10/25/2024 INDICATION: 73 year old female with history of rectal cancer requiring central venous access for chemotherapy administration. EXAM: IMPLANTED PORT A CATH PLACEMENT WITH ULTRASOUND AND FLUOROSCOPIC GUIDANCE COMPARISON:  None Available. MEDICATIONS: None. ANESTHESIA/SEDATION: Moderate (conscious) sedation was employed during this procedure. A total of Versed  2 mg and Fentanyl  100 mcg was administered intravenously. Moderate Sedation Time: 20 minutes. The patient's level of consciousness and vital signs were monitored continuously by radiology nursing throughout the procedure under my direct  supervision. CONTRAST:  None FLUOROSCOPY TIME:  Two mGy reference air kerma COMPLICATIONS: None immediate. PROCEDURE: The procedure, risks, benefits, and alternatives were explained  to the patient. Questions regarding the procedure were encouraged and answered. The patient understands and consents to the procedure. The left neck and chest were prepped with chlorhexidine in a sterile fashion, and a sterile drape was applied covering the operative field. Maximum barrier sterile technique with sterile gowns and gloves were used for the procedure. A timeout was performed prior to the initiation of the procedure. Ultrasound was used to examine the jugular vein which was compressible and free of internal echoes. A skin marker was used to demarcate the planned venotomy and port pocket incision sites. Local anesthesia was provided to these sites and the subcutaneous tunnel track with 1% lidocaine  with 1:100,000 epinephrine . A small incision was created at the jugular access site and blunt dissection was performed of the subcutaneous tissues. Under ultrasound guidance, the jugular vein was accessed with a 21 ga micropuncture needle and an 0.018 wire was inserted to the superior vena cava. Real-time ultrasound guidance was utilized for vascular access including the acquisition of a permanent ultrasound image documenting patency of the accessed vessel. A 5 Fr micopuncture set was then used, through which a 0.035 Rosen wire was passed under fluoroscopic guidance into the inferior vena cava. An 8 Fr dilator was then placed over the wire. A subcutaneous port pocket was then created along the upper chest wall utilizing a combination of sharp and blunt dissection. The pocket was irrigated with sterile saline, packed with gauze, and observed for hemorrhage. A single lumen clear view power injectable port was chosen for placement. The 8 Fr catheter was tunneled from the port pocket site to the venotomy incision. The port was placed  in the pocket. The external catheter was trimmed to appropriate length. The dilator was exchanged for an 8 Fr peel-away sheath under fluoroscopic guidance. The catheter was then placed through the sheath and the sheath was removed. Final catheter positioning was confirmed and documented with a fluoroscopic spot radiograph. The port was accessed with a Huber needle, aspirated, and flushed with heparinized saline. The deep dermal layer of the port pocket incision was closed with interrupted 3-0 Vicryl suture. Dermabond was then placed over the port pocket and neck incisions. The patient tolerated the procedure well without immediate post procedural complication. FINDINGS: After catheter placement, the tip lies within the superior cavoatrial junction. The catheter aspirates and flushes normally and is ready for immediate use. IMPRESSION: Successful placement of a power injectable Port-A-Cath via the left internal jugular vein. The catheter is ready for immediate use. Ester Sides, MD [facility] Radiology Electronically Signed   By: Ester Sides M.D.   On: 10/25/2024 15:09     Assessment and plan- Patient is a 73 y.o. 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 Proceeding as stage III. Asymptomatic. Initiating total neoadjuvant therapy with chemoradiation followed by FOLFOX. Discussed potential for non-operative management if complete response achieved. Explained side effects and reassured regarding low risk of alopecia, nail changes, osteoporosis exacerbation. Encouraged exercise with precautions. Informed about food hygiene due to immunosuppression risk. Discussed post-treatment imaging and surveillance options. -  Initiated chemoradiation with 5-FU and pelvic radiation (45 Gy over five weeks, boost to 55 Gy). - Adjusted chemotherapy dosing and pump schedule for holiday-related radiation changes. - Scheduled pump removal  for Friday this week and Wednesday next week, then pump on Mondays and off Fridays. - Planned follow-up every other week during chemoradiation. - Transition to FOLFOX chemotherapy post-chemoradiation, every two weeks for eight cycles (four months). - Ordered weekly blood work monitoring during treatment. - Planned post-treatment MRI 6-8 weeks after radiation to assess response to be done at [facility] - Discussed post-treatment options: wait-and-watch with surveillance colonoscopies and MRI, or surgery if residual disease. - Prescribed prochlorperazine  and ondansetron  for nausea. - Advised on food hygiene and avoidance of fresh salads unless thoroughly washed. - Encouraged physical activity with restrictions on heavy lifting with upper extremities due to port. - Guided on showering and port care during pump use. - Scheduled follow-up in two weeks after next week's treatment adjustments.  Presence of Port-A-Cath Port-A-Cath in place for chemotherapy. Addressed concerns about activity restrictions and port care. - Advised to avoid heavy lifting with upper extremities while port needle is in place. - Provided instructions for showering with port and pump, recommending sponge baths or gentle showering. - Encouraged continued lower body exercise and walking, with caution regarding port site. - Reinforced port care and monitoring during chemotherapy.         Visit Diagnosis 1. Rectal cancer (HCC)   2. Port-A-Cath in place      Dr. Annah Skene, MD, MPH [facility] [phone] 10/29/2024 1:14 PM                   [1]  Allergies Allergen Reactions   Taxotere [Docetaxel] Other (See Comments) and Anaphylaxis    flush and back pain  [2]  Current  Outpatient Medications:    Aspirin 81 MG CAPS, Take by oral route., Disp: , Rfl:    calcium carbonate (OS-CAL) 600 MG TABS tablet, Take 600 mg by mouth daily., Disp: , Rfl:    Cholecalciferol (VITAMIN D3) 25 MCG (1000 UT) CAPS, Take by mouth., Disp: , Rfl:    levothyroxine (SYNTHROID, LEVOTHROID) 75 MCG tablet, Take 75 mcg by mouth daily before breakfast., Disp: , Rfl:    metoprolol tartrate (LOPRESSOR) 25 MG tablet, Take 12.5 mg by mouth 2 (two) times daily., Disp: , Rfl:    Multiple Vitamin (MULTIVITAMIN) tablet, Take 1 tablet by mouth daily., Disp: , Rfl:    ondansetron  (ZOFRAN ) 8 MG tablet, Take 1 tablet (8 mg total) by mouth every 8 (eight) hours as needed for nausea or vomiting., Disp: 30 tablet, Rfl: 1   pravastatin (PRAVACHOL) 40 MG tablet, Take 40 mg by mouth daily., Disp: , Rfl:    prochlorperazine  (COMPAZINE ) 10 MG tablet, Take 1 tablet (10 mg total) by mouth every 6 (six) hours as needed for nausea or vomiting., Disp: 30 tablet, Rfl: 1   alendronate (FOSAMAX) 70 MG tablet, Take 70 mg by mouth once a week. (Patient not taking: Reported on 09/03/2024), Disp: , Rfl:    lidocaine -prilocaine  (EMLA ) cream, Apply to affected area once, Disp: 30 g, Rfl: 3   metoprolol succinate (TOPROL-XL) 25 MG 24 hr tablet, Take 25 mg by mouth daily. (Patient not taking: Reported on 09/05/2024), Disp: , Rfl:    polyethylene glycol-electrolytes (NULYTELY) 420 g solution, as directed. (Patient not taking: Reported on 09/05/2024), Disp: , Rfl:  No current facility-administered medications for this visit.  Facility-Administered Medications Ordered in Other Visits:    fluorouracil  (ADRUCIL ) 2,000 mg in sodium chloride  0.9 % 110 mL chemo infusion, 2,000 mg, Intravenous, 4 days, Skene Annah BROCKS, MD, 2,000 mg at 10/29/24  1255 ° °

## 2024-10-29 NOTE — Telephone Encounter (Signed)
 Oral Oncology Patient Advocate Encounter  Prior Authorization for  lidocaine -prilocaine  (EMLA ) cream has been approved.    PA# [id number] Effective dates: 10/29/2024 through 10/29/2025   Barbette (Patty) Chet Burnet, CPhT  [facility], [place], [place] Hematology/Oncology - Oral Chemotherapy Patient Advocate Specialist III Phone: [phone]  Fax: [phone]

## 2024-10-29 NOTE — Progress Notes (Signed)
 Patient states she got a call Saturday stating her insurance company stating that her chemo medication will not be covered. She also stated that she reached out to her nurse navigator & has no heard back from her. She would like insight on how to proceed.

## 2024-10-29 NOTE — Patient Instructions (Addendum)
 [facility] - A DEPT OF [facility]  Discharge Instructions: Thank you for choosing [facility] to provide your oncology and hematology care.  If you have a lab appointment with the Cancer Center, please go directly to the Cancer Center and check in at the registration area.  Wear comfortable clothing and clothing appropriate for easy access to any Portacath or PICC line.   We strive to give you quality time with your provider. You may need to reschedule your appointment if you arrive late (15 or more minutes).  Arriving late affects you and other patients whose appointments are after yours.  Also, if you miss three or more appointments without notifying the office, you may be dismissed from the clinic at the providers discretion.      For prescription refill requests, have your pharmacy contact our office and allow 72 hours for refills to be completed.    Today you received the following chemotherapy and/or immunotherapy agents fluorouracil      To help prevent nausea and vomiting after your treatment, we encourage you to take your nausea medication as directed.  BELOW ARE SYMPTOMS THAT SHOULD BE REPORTED IMMEDIATELY: *FEVER GREATER THAN 100.4 F (38 C) OR HIGHER *CHILLS OR SWEATING *NAUSEA AND VOMITING THAT IS NOT CONTROLLED WITH YOUR NAUSEA MEDICATION *UNUSUAL SHORTNESS OF BREATH *UNUSUAL BRUISING OR BLEEDING *URINARY PROBLEMS (pain or burning when urinating, or frequent urination) *BOWEL PROBLEMS (unusual diarrhea, constipation, pain near the anus) TENDERNESS IN MOUTH AND THROAT WITH OR WITHOUT PRESENCE OF ULCERS (sore throat, sores in mouth, or a toothache) UNUSUAL RASH, SWELLING OR PAIN  UNUSUAL VAGINAL DISCHARGE OR ITCHING   Items with * indicate a potential emergency and should be followed up as soon as possible or go to the Emergency Department if any problems should occur.  Please show the CHEMOTHERAPY ALERT CARD or IMMUNOTHERAPY  ALERT CARD at check-in to the Emergency Department and triage nurse.  Should you have questions after your visit or need to cancel or reschedule your appointment, please contact [facility] - A DEPT OF [facility]  [phone] and follow the prompts.  Office hours are 8:00 a.m. to 4:30 p.m. Monday - Friday. Please note that voicemails left after 4:00 p.m. may not be returned until the following business day.  We are closed weekends and major holidays. You have access to a nurse at all times for urgent questions. Please call the main number to the clinic [phone] and follow the prompts.  For any non-urgent questions, you may also contact your provider using MyChart. We now offer e-Visits for anyone 26 and older to request care online for non-urgent symptoms. For details visit [email or web address].   Also download the MyChart app! Go to the app store, search MyChart, open the app, select [facility], and log in with your MyChart username and password.   Fluorouracil  Injection What is this medication? FLUOROURACIL  (flure oh YOOR a sil) treats some types of cancer. It works by slowing down the growth of cancer cells. This medicine may be used for other purposes; ask your health care provider or pharmacist if you have questions. COMMON BRAND NAME(S): Adrucil  What should I tell my care team before I take this medication? They need to know if you have any of these conditions: Blood disorders Dihydropyrimidine dehydrogenase (DPD) deficiency Infection, such as chickenpox, cold sores, herpes Kidney disease Liver disease Poor nutrition Recent or ongoing radiation therapy An  unusual or allergic reaction to fluorouracil , other medications, foods, dyes, or preservatives If you or your partner are pregnant or trying to get pregnant Breast-feeding How should I use this medication? This medication is injected into a vein. It is administered by your care team in a  hospital or clinic setting. Talk to your care team about the use of this medication in children. Special care may be needed. Overdosage: If you think you have taken too much of this medicine contact a poison control center or emergency room at once. NOTE: This medicine is only for you. Do not share this medicine with others. What if I miss a dose? Keep appointments for follow-up doses. It is important not to miss your dose. Call your care team if you are unable to keep an appointment. What may interact with this medication? Do not take this medication with any of the following: Live virus vaccines This medication may also interact with the following: Medications that treat or prevent blood clots, such as warfarin, enoxaparin, dalteparin This list may not describe all possible interactions. Give your health care provider a list of all the medicines, herbs, non-prescription drugs, or dietary supplements you use. Also tell them if you smoke, drink alcohol, or use illegal drugs. Some items may interact with your medicine. What should I watch for while using this medication? Your condition will be monitored carefully while you are receiving this medication. This medication may make you feel generally unwell. This is not uncommon as chemotherapy can affect healthy cells as well as cancer cells. Report any side effects. Continue your course of treatment even though you feel ill unless your care team tells you to stop. In some cases, you may be given additional medications to help with side effects. Follow all directions for their use. This medication may increase your risk of getting an infection. Call your care team for advice if you get a fever, chills, sore throat, or other symptoms of a cold or flu. Do not treat yourself. Try to avoid being around people who are sick. This medication may increase your risk to bruise or bleed. Call your care team if you notice any unusual bleeding. Be careful brushing  or flossing your teeth or using a toothpick because you may get an infection or bleed more easily. If you have any dental work done, tell your dentist you are receiving this medication. Avoid taking medications that contain aspirin, acetaminophen , ibuprofen, naproxen, or ketoprofen unless instructed by your care team. These medications may hide a fever. Do not treat diarrhea with over the counter products. Contact your care team if you have diarrhea that lasts more than 2 days or if it is severe and watery. This medication can make you more sensitive to the sun. Keep out of the sun. If you cannot avoid being in the sun, wear protective clothing and sunscreen. Do not use sun lamps, tanning beds, or tanning booths. Talk to your care team if you or your partner wish to become pregnant or think you might be pregnant. This medication can cause serious birth defects if taken during pregnancy and for 3 months after the last dose. A reliable form of contraception is recommended while taking this medication and for 3 months after the last dose. Talk to your care team about effective forms of contraception. Do not father a child while taking this medication and for 3 months after the last dose. Use a condom while having sex during this time period. Do not breastfeed while  taking this medication. This medication may cause infertility. Talk to your care team if you are concerned about your fertility. What side effects may I notice from receiving this medication? Side effects that you should report to your care team as soon as possible: Allergic reactions--skin rash, itching, hives, swelling of the face, lips, tongue, or throat Heart attack--pain or tightness in the chest, shoulders, arms, or jaw, nausea, shortness of breath, cold or clammy skin, feeling faint or lightheaded Heart failure--shortness of breath, swelling of the ankles, feet, or hands, sudden weight gain, unusual weakness or fatigue Heart rhythm  changes--fast or irregular heartbeat, dizziness, feeling faint or lightheaded, chest pain, trouble breathing High ammonia level--unusual weakness or fatigue, confusion, loss of appetite, nausea, vomiting, seizures Infection--fever, chills, cough, sore throat, wounds that don't heal, pain or trouble when passing urine, general feeling of discomfort or being unwell Low red blood cell level--unusual weakness or fatigue, dizziness, headache, trouble breathing Pain, tingling, or numbness in the hands or feet, muscle weakness, change in vision, confusion or trouble speaking, loss of balance or coordination, trouble walking, seizures Redness, swelling, and blistering of the skin over hands and feet Severe or prolonged diarrhea Unusual bruising or bleeding Side effects that usually do not require medical attention (report to your care team if they continue or are bothersome): Dry skin Headache Increased tears Nausea Pain, redness, or swelling with sores inside the mouth or throat Sensitivity to light Vomiting This list may not describe all possible side effects. Call your doctor for medical advice about side effects. You may report side effects to FDA at 1-800-FDA-1088. Where should I keep my medication? This medication is given in a hospital or clinic. It will not be stored at home. NOTE: This sheet is a summary. It may not cover all possible information. If you have questions about this medicine, talk to your doctor, pharmacist, or health care provider.  2024 [employer]/Gold Standard (2022-03-09 00:00:00)

## 2024-10-29 NOTE — Telephone Encounter (Signed)
 Call placed to Kathy Wood. Verified that prior authorization was obtained for MRI and chemotherapy infusion. All questions answered.

## 2024-10-30 ENCOUNTER — Ambulatory Visit
Admission: RE | Admit: 2024-10-30 | Discharge: 2024-10-30 | Attending: Radiation Oncology | Admitting: Radiation Oncology

## 2024-10-30 ENCOUNTER — Telehealth: Payer: Self-pay

## 2024-10-30 ENCOUNTER — Other Ambulatory Visit: Payer: Self-pay

## 2024-10-30 DIAGNOSIS — Z510 Encounter for antineoplastic radiation therapy: Secondary | ICD-10-CM | POA: Diagnosis not present

## 2024-10-30 LAB — RAD ONC ARIA SESSION SUMMARY
Course Elapsed Days: 0
Plan Fractions Treated to Date: 1
Plan Prescribed Dose Per Fraction: 1.8 Gy
Plan Total Fractions Prescribed: 25
Plan Total Prescribed Dose: 45 Gy
Reference Point Dosage Given to Date: 1.8 Gy
Reference Point Session Dosage Given: 1.8 Gy
Session Number: 1

## 2024-10-30 NOTE — Telephone Encounter (Signed)
Telephone call to patient for follow up after receiving first infusion.   Patient states infusion went great.  States eating good and drinking plenty of fluids.   Denies any nausea or vomiting.  Encouraged patient to call for any concerns or questions. 

## 2024-10-31 ENCOUNTER — Other Ambulatory Visit: Payer: Self-pay

## 2024-10-31 ENCOUNTER — Ambulatory Visit
Admission: RE | Admit: 2024-10-31 | Discharge: 2024-10-31 | Attending: Radiation Oncology | Admitting: Radiation Oncology

## 2024-10-31 DIAGNOSIS — Z510 Encounter for antineoplastic radiation therapy: Secondary | ICD-10-CM | POA: Diagnosis not present

## 2024-10-31 LAB — RAD ONC ARIA SESSION SUMMARY
Course Elapsed Days: 1
Plan Fractions Treated to Date: 2
Plan Prescribed Dose Per Fraction: 1.8 Gy
Plan Total Fractions Prescribed: 25
Plan Total Prescribed Dose: 45 Gy
Reference Point Dosage Given to Date: 3.6 Gy
Reference Point Session Dosage Given: 1.8 Gy
Session Number: 2

## 2024-11-01 ENCOUNTER — Ambulatory Visit
Admission: RE | Admit: 2024-11-01 | Discharge: 2024-11-01 | Attending: Radiation Oncology | Admitting: Radiation Oncology

## 2024-11-01 ENCOUNTER — Other Ambulatory Visit: Payer: Self-pay

## 2024-11-01 DIAGNOSIS — Z510 Encounter for antineoplastic radiation therapy: Secondary | ICD-10-CM | POA: Diagnosis not present

## 2024-11-01 LAB — RAD ONC ARIA SESSION SUMMARY
Course Elapsed Days: 2
Plan Fractions Treated to Date: 3
Plan Prescribed Dose Per Fraction: 1.8 Gy
Plan Total Fractions Prescribed: 25
Plan Total Prescribed Dose: 45 Gy
Reference Point Dosage Given to Date: 5.4 Gy
Reference Point Session Dosage Given: 1.8 Gy
Session Number: 3

## 2024-11-02 ENCOUNTER — Other Ambulatory Visit: Payer: Self-pay

## 2024-11-02 ENCOUNTER — Inpatient Hospital Stay

## 2024-11-02 ENCOUNTER — Ambulatory Visit
Admission: RE | Admit: 2024-11-02 | Discharge: 2024-11-02 | Attending: Radiation Oncology | Admitting: Radiation Oncology

## 2024-11-02 DIAGNOSIS — Z510 Encounter for antineoplastic radiation therapy: Secondary | ICD-10-CM | POA: Diagnosis not present

## 2024-11-02 LAB — RAD ONC ARIA SESSION SUMMARY
Course Elapsed Days: 3
Plan Fractions Treated to Date: 4
Plan Prescribed Dose Per Fraction: 1.8 Gy
Plan Total Fractions Prescribed: 25
Plan Total Prescribed Dose: 45 Gy
Reference Point Dosage Given to Date: 7.2 Gy
Reference Point Session Dosage Given: 1.8 Gy
Session Number: 4

## 2024-11-05 ENCOUNTER — Inpatient Hospital Stay

## 2024-11-05 ENCOUNTER — Inpatient Hospital Stay: Admitting: Oncology

## 2024-11-05 ENCOUNTER — Ambulatory Visit
Admission: RE | Admit: 2024-11-05 | Discharge: 2024-11-05 | Attending: Radiation Oncology | Admitting: Radiation Oncology

## 2024-11-05 ENCOUNTER — Other Ambulatory Visit: Payer: Self-pay

## 2024-11-05 VITALS — BP 142/56 | HR 84 | Temp 97.0°F | Resp 18

## 2024-11-05 DIAGNOSIS — C20 Malignant neoplasm of rectum: Secondary | ICD-10-CM

## 2024-11-05 DIAGNOSIS — Z5111 Encounter for antineoplastic chemotherapy: Secondary | ICD-10-CM | POA: Diagnosis not present

## 2024-11-05 DIAGNOSIS — Z510 Encounter for antineoplastic radiation therapy: Secondary | ICD-10-CM | POA: Diagnosis not present

## 2024-11-05 LAB — RAD ONC ARIA SESSION SUMMARY
Course Elapsed Days: 6
Plan Fractions Treated to Date: 5
Plan Prescribed Dose Per Fraction: 1.8 Gy
Plan Total Fractions Prescribed: 25
Plan Total Prescribed Dose: 45 Gy
Reference Point Dosage Given to Date: 9 Gy
Reference Point Session Dosage Given: 1.8 Gy
Session Number: 5

## 2024-11-05 LAB — CMP (CANCER CENTER ONLY)
ALT: 15 U/L (ref 0–44)
AST: 21 U/L (ref 15–41)
Albumin: 4.1 g/dL (ref 3.5–5.0)
Alkaline Phosphatase: 66 U/L (ref 38–126)
Anion gap: 9 (ref 5–15)
BUN: 15 mg/dL (ref 8–23)
CO2: 26 mmol/L (ref 22–32)
Calcium: 9.6 mg/dL (ref 8.9–10.3)
Chloride: 103 mmol/L (ref 98–111)
Creatinine: 0.79 mg/dL (ref 0.44–1.00)
GFR, Estimated: >60 mL/min
Glucose, Bld: 96 mg/dL (ref 70–99)
Potassium: 4.2 mmol/L (ref 3.5–5.1)
Sodium: 139 mmol/L (ref 135–145)
Total Bilirubin: 0.4 mg/dL (ref 0.0–1.2)
Total Protein: 6.9 g/dL (ref 6.5–8.1)

## 2024-11-05 LAB — CBC WITH DIFFERENTIAL (CANCER CENTER ONLY)
Abs Immature Granulocytes: 0.01 K/uL (ref 0.00–0.07)
Basophils Absolute: 0.1 K/uL (ref 0.0–0.1)
Basophils Relative: 1 %
Eosinophils Absolute: 0.1 K/uL (ref 0.0–0.5)
Eosinophils Relative: 3 %
HCT: 36 % (ref 36.0–46.0)
Hemoglobin: 12.1 g/dL (ref 12.0–15.0)
Immature Granulocytes: 0 %
Lymphocytes Relative: 27 %
Lymphs Abs: 1.1 K/uL (ref 0.7–4.0)
MCH: 32.2 pg (ref 26.0–34.0)
MCHC: 33.6 g/dL (ref 30.0–36.0)
MCV: 95.7 fL (ref 80.0–100.0)
Monocytes Absolute: 0.3 K/uL (ref 0.1–1.0)
Monocytes Relative: 6 %
Neutro Abs: 2.6 K/uL (ref 1.7–7.7)
Neutrophils Relative %: 63 %
Platelet Count: 273 K/uL (ref 150–400)
RBC: 3.76 MIL/uL — ABNORMAL LOW (ref 3.87–5.11)
RDW: 12 % (ref 11.5–15.5)
WBC Count: 4.1 K/uL (ref 4.0–10.5)
nRBC: 0 % (ref 0.0–0.2)

## 2024-11-05 MED ADMIN — FLUOROURACIL (5-FU) CHEMO IV INFUSION PUMP <5 GM: 900 mg | INTRAVENOUS | NDC 63323011761

## 2024-11-05 MED FILL — Fluorouracil IV Soln 5 GM/100ML (50 MG/ML): 900.0000 mg | INTRAVENOUS | Qty: 18 | Status: AC

## 2024-11-06 ENCOUNTER — Ambulatory Visit
Admission: RE | Admit: 2024-11-06 | Discharge: 2024-11-06 | Disposition: A | Discharge status: Home / Self Care | Source: Ambulatory Visit | Attending: Radiation Oncology | Admitting: Radiation Oncology

## 2024-11-06 ENCOUNTER — Other Ambulatory Visit: Payer: Self-pay

## 2024-11-06 DIAGNOSIS — Z510 Encounter for antineoplastic radiation therapy: Secondary | ICD-10-CM | POA: Diagnosis not present

## 2024-11-06 LAB — RAD ONC ARIA SESSION SUMMARY
Course Elapsed Days: 7
Plan Fractions Treated to Date: 6
Plan Prescribed Dose Per Fraction: 1.8 Gy
Plan Total Fractions Prescribed: 25
Plan Total Prescribed Dose: 45 Gy
Reference Point Dosage Given to Date: 10.8 Gy
Reference Point Session Dosage Given: 1.8 Gy
Session Number: 6

## 2024-11-07 ENCOUNTER — Ambulatory Visit
Admission: RE | Admit: 2024-11-07 | Discharge: 2024-11-07 | Disposition: A | Source: Ambulatory Visit | Attending: Radiation Oncology | Admitting: Radiation Oncology

## 2024-11-07 ENCOUNTER — Other Ambulatory Visit: Payer: Self-pay

## 2024-11-07 ENCOUNTER — Inpatient Hospital Stay

## 2024-11-07 DIAGNOSIS — Z510 Encounter for antineoplastic radiation therapy: Secondary | ICD-10-CM | POA: Diagnosis not present

## 2024-11-07 LAB — RAD ONC ARIA SESSION SUMMARY
Course Elapsed Days: 8
Plan Fractions Treated to Date: 7
Plan Prescribed Dose Per Fraction: 1.8 Gy
Plan Total Fractions Prescribed: 25
Plan Total Prescribed Dose: 45 Gy
Reference Point Dosage Given to Date: 12.6 Gy
Reference Point Session Dosage Given: 1.8 Gy
Session Number: 7

## 2024-11-09 LAB — MISC LABCORP TEST (SEND OUT): Labcorp test code: 512275

## 2024-11-12 ENCOUNTER — Inpatient Hospital Stay

## 2024-11-12 ENCOUNTER — Ambulatory Visit
Admission: RE | Admit: 2024-11-12 | Discharge: 2024-11-12 | Disposition: A | Discharge status: Home / Self Care | Source: Ambulatory Visit | Attending: Radiation Oncology | Admitting: Radiation Oncology

## 2024-11-12 ENCOUNTER — Inpatient Hospital Stay: Admitting: Oncology

## 2024-11-12 ENCOUNTER — Encounter: Payer: Self-pay | Admitting: Oncology

## 2024-11-12 ENCOUNTER — Other Ambulatory Visit: Payer: Self-pay

## 2024-11-12 VITALS — BP 130/61 | HR 74 | Temp 99.0°F | Ht 64.0 in | Wt 170.0 lb

## 2024-11-12 DIAGNOSIS — T451X5A Adverse effect of antineoplastic and immunosuppressive drugs, initial encounter: Secondary | ICD-10-CM

## 2024-11-12 DIAGNOSIS — Z5111 Encounter for antineoplastic chemotherapy: Secondary | ICD-10-CM

## 2024-11-12 DIAGNOSIS — Z510 Encounter for antineoplastic radiation therapy: Secondary | ICD-10-CM | POA: Diagnosis not present

## 2024-11-12 DIAGNOSIS — D701 Agranulocytosis secondary to cancer chemotherapy: Secondary | ICD-10-CM

## 2024-11-12 DIAGNOSIS — C20 Malignant neoplasm of rectum: Secondary | ICD-10-CM

## 2024-11-12 LAB — CBC WITH DIFFERENTIAL (CANCER CENTER ONLY)
Abs Immature Granulocytes: 0.02 K/uL (ref 0.00–0.07)
Basophils Absolute: 0 K/uL (ref 0.0–0.1)
Basophils Relative: 1 %
Eosinophils Absolute: 0.1 K/uL (ref 0.0–0.5)
Eosinophils Relative: 4 %
HCT: 36.1 % (ref 36.0–46.0)
Hemoglobin: 12.2 g/dL (ref 12.0–15.0)
Immature Granulocytes: 1 %
Lymphocytes Relative: 24 %
Lymphs Abs: 0.8 K/uL (ref 0.7–4.0)
MCH: 32.4 pg (ref 26.0–34.0)
MCHC: 33.8 g/dL (ref 30.0–36.0)
MCV: 96 fL (ref 80.0–100.0)
Monocytes Absolute: 0.4 K/uL (ref 0.1–1.0)
Monocytes Relative: 11 %
Neutro Abs: 2 K/uL (ref 1.7–7.7)
Neutrophils Relative %: 59 %
Platelet Count: 194 K/uL (ref 150–400)
RBC: 3.76 MIL/uL — ABNORMAL LOW (ref 3.87–5.11)
RDW: 12.8 % (ref 11.5–15.5)
WBC Count: 3.3 K/uL — ABNORMAL LOW (ref 4.0–10.5)
nRBC: 0 % (ref 0.0–0.2)

## 2024-11-12 LAB — RAD ONC ARIA SESSION SUMMARY
Course Elapsed Days: 13
Plan Fractions Treated to Date: 8
Plan Prescribed Dose Per Fraction: 1.8 Gy
Plan Total Fractions Prescribed: 25
Plan Total Prescribed Dose: 45 Gy
Reference Point Dosage Given to Date: 14.4 Gy
Reference Point Session Dosage Given: 1.8 Gy
Session Number: 8

## 2024-11-12 LAB — CMP (CANCER CENTER ONLY)
ALT: 15 U/L (ref 0–44)
AST: 20 U/L (ref 15–41)
Albumin: 4.2 g/dL (ref 3.5–5.0)
Alkaline Phosphatase: 70 U/L (ref 38–126)
Anion gap: 9 (ref 5–15)
BUN: 14 mg/dL (ref 8–23)
CO2: 26 mmol/L (ref 22–32)
Calcium: 9.6 mg/dL (ref 8.9–10.3)
Chloride: 104 mmol/L (ref 98–111)
Creatinine: 0.75 mg/dL (ref 0.44–1.00)
GFR, Estimated: >60 mL/min
Glucose, Bld: 122 mg/dL — ABNORMAL HIGH (ref 70–99)
Potassium: 4.3 mmol/L (ref 3.5–5.1)
Sodium: 139 mmol/L (ref 135–145)
Total Bilirubin: <0.2 mg/dL (ref 0.0–1.2)
Total Protein: 6.9 g/dL (ref 6.5–8.1)

## 2024-11-12 MED ORDER — SODIUM CHLORIDE 0.9 % IV SOLN
1800.0000 mg | INTRAVENOUS | Status: DC
  Administered 2024-11-12: 1800 mg via INTRAVENOUS
  Filled 2024-11-12: qty 36

## 2024-11-12 NOTE — Progress Notes (Signed)
 "    Hematology/Oncology Consult note Kathy Wood  Telephone:([phone] [phone]  Patient Care Team: Kathy Manna, MD as PCP - General (Internal Medicine) Kathy Rayfield BIRCH, RN as Oncology Nurse Navigator Kathy Annah BROCKS, MD as Consulting Physician (Oncology)   Name of the patient: Kathy Wood  [id number]  10-24-1951   Date of visit: 11/12/2024  Diagnosis-  Cancer Staging  Cancer of left breast [employer]) Staging form: Breast, AJCC 7th Edition - Clinical: Stage IA (T1c, N0, M0) - Unsigned Laterality: Left  Rectal cancer [facility]) Staging form: Colon and Rectum, AJCC 8th Edition - Clinical stage from 09/30/2024: Stage IIIB (cT3, cN1a, cM0) - Signed by Kathy Annah BROCKS, MD on 09/30/2024 Histopathologic type: Adenocarcinoma, NOS Total positive nodes: 1    Chief complaint/ Reason for visit-on treatment assessment prior to week 3 of infusional 5-FU chemotherapy  Heme/Onc history: Kathy Wood is a 73 year old female with a history of breast cancer who presents with rectal bleeding and a newly diagnosed rectal mass. She was referred by Kathy Wood for evaluation of rectal bleeding.   She has experienced intermittent rectal bleeding since the summer, with the initial episode not recurring. Colonoscopy showed nonobstructing medium-sized mass 3 to 5 cm from the anus measuring 2 cm in size.  Biopsy was consistent with moderate to poorly differentiated adenocarcinoma. MSI stable disease.    Her last colonoscopy in 2017 showed no abnormalities, and she has not undergone any other related procedures since then.   She has a history of breast cancer treated with mastectomy and chemotherapy 15 years ago. Genetic testing five years ago did not reveal significant findings. No family history of colon, breast, or stomach cancer.   She denies other symptoms such as pain or pressure.    MRI pelvis on 09/25/2024 showed T3a N1 disease with  possible involvement of levator ani musculature.  Distance of the tumor from anal verge 5 cm.  Tumor distance from internal anal sphincter 2.5 cm.  Possible small volume extension at 7 o'clock position less than 1 mm T3a.  High mesorectal lymph node 8 mm. N1.    Patient has been seen by [facility] and plan is for total neoadjuvant treatment with concurrent chemoradiation with infusional 5-FU followed by FOLFOX chemotherapy for 4 months and consideration for surgery versus wait and watch approach    Interval history-tolerating treatments well so far.  She had a self-limited episode of abdominal bloating and gas-like sensation which resolved without any medical management.  Denies any nausea vomiting diarrhea.  Denies any rectal pain  ECOG PS- 0 Pain scale- 0   Review of systems- Review of Systems  Constitutional:  Negative for chills, fever, malaise/fatigue and weight loss.  HENT:  Negative for congestion, ear discharge and nosebleeds.   Eyes:  Negative for blurred vision.  Respiratory:  Negative for cough, hemoptysis, sputum production, shortness of breath and wheezing.   Cardiovascular:  Negative for chest pain, palpitations, orthopnea and claudication.  Gastrointestinal:  Negative for abdominal pain, blood in stool, constipation, diarrhea, heartburn, melena, nausea and vomiting.  Genitourinary:  Negative for dysuria, flank pain, frequency, hematuria and urgency.  Musculoskeletal:  Negative for back pain, joint pain and myalgias.  Skin:  Negative for rash.  Neurological:  Negative for dizziness, tingling, focal weakness, seizures, weakness and headaches.  Endo/Heme/Allergies:  Does not bruise/bleed easily.  Psychiatric/Behavioral:  Negative for depression and suicidal ideas. The patient does not have insomnia.  Allergies[1]   Past Medical History:  Diagnosis Date   Actinic keratosis    Aortic valvar stenosis    Valve replacement. [facility]. 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 Medtronic Azure Xt Dr Levada Pauling. [id number]   Rectal cancer [facility])      Past Surgical History:  Procedure Laterality Date   AUGMENTATION MAMMAPLASTY Left 2010   mastectomy   BREAST LUMPECTOMY Left 2006   positive   CARDIAC CATHETERIZATION  11/29/2018   [facility]   CARDIAC VALVE REPLACEMENT  12/13/2018   [facility]. Aortic Valve.   CATARACT EXTRACTION W/PHACO Left 05/22/2019   Procedure: CATARACT EXTRACTION PHACO AND INTRAOCULAR LENS PLACEMENT (IOC) left;  Surgeon: Jaye Fallow, MD;  Location: [facility];  Service: Ophthalmology;  Laterality: Left;   CATARACT EXTRACTION W/PHACO Right 06/12/2019   Procedure: CATARACT EXTRACTION PHACO AND INTRAOCULAR LENS PLACEMENT (IOC)  RIGHT;  Surgeon: Jaye Fallow, MD;  Location: [facility];  Service: Ophthalmology;  Laterality: Right;   COLONOSCOPY N/A 09/03/2024   Procedure: COLONOSCOPY;  Surgeon: Therisa Bi, MD;  Location: [facility];  Service: Gastroenterology;  Laterality: N/A;  ANTIBIOTICS BEFORE PROCEDURE PER OFFICE   COLONOSCOPY WITH PROPOFOL  N/A 10/29/2016   Procedure: COLONOSCOPY WITH PROPOFOL ;  Surgeon: Gladis RAYMOND Mariner, MD;  Location: [facility];  Service: Endoscopy;  Laterality: N/A;   EYE SURGERY     INSERT / REPLACE / REMOVE PACEMAKER  12/15/2018   [facility].  Medtronic Azure Xt Dr Levada Pauling [id number]   IR IMAGING GUIDED PORT INSERTION  10/25/2024   MASTECTOMY Left 2010   RECONSTRUCTIVE REPAIR STERNAL Left    REDUCTION MAMMAPLASTY Right 2010    Social History   Socioeconomic History   Marital  status: Married    Spouse name: Renesmae Donahey   Number of children: 3   Years of education: Not on file   Highest education level: Not on file  Occupational History   Not on file  Tobacco Use   Smoking status: Never   Smokeless tobacco: Never  Vaping Use   Vaping status: Never Used  Substance and Sexual Activity   Alcohol use: Yes    Comment: rare drinker   Drug use: No   Sexual activity: Yes  Other Topics Concern   Not on file  Social History Narrative   Not on file   Social Drivers of Health   Tobacco Use: Low Risk (11/12/2024)   Patient History    Smoking Tobacco Use: Never    Smokeless Tobacco Use: Never    Passive Exposure: Not on file  Financial Resource Strain: Low Risk  (08/13/2024)   Received from [facility]   Overall Financial Resource Strain (CARDIA)    Difficulty of Paying Living Expenses: Not hard at all  Food Insecurity: No Food Insecurity (09/05/2024)   Epic    Worried About Running Out of Food in the Last Year: Never true    Ran Out of Food in the Last Year: Never true  Transportation Needs: No Transportation Needs (09/05/2024)  Epic    Lack of Transportation (Medical): No    Lack of Transportation (Non-Medical): No  Physical Activity: Not on file  Stress: Not on file  Social Connections: Not on file  Intimate Partner Violence: Not At Risk (09/05/2024)   Epic    Fear of Current or Ex-Partner: No    Emotionally Abused: No    Physically Abused: No    Sexually Abused: No  Depression (PHQ2-9): Low Risk (11/12/2024)   Depression (PHQ2-9)    PHQ-2 Score: 0  Alcohol Screen: Not on file  Housing: Low Risk (09/05/2024)   Epic    Unable to Pay for Housing in the Last Year: No    Number of Times Moved in the Last Year: 0    Homeless in the Last Year: No  Utilities: Not At Risk (09/05/2024)   Epic    Threatened with loss of utilities: No  Health Literacy: Not on file    Family History  Problem Relation Age of Onset   Breast cancer  Neg Hx     Current Medications[2]  Physical exam:  Vitals:   11/12/24 1351 11/12/24 1427  BP: (!) 141/77 130/61  Pulse: 77 74  Temp: 99 F (37.2 C)   TempSrc: Tympanic   SpO2: 99%   Weight: 170 lb (77.1 kg)   Height: 5' 4 (1.626 m)    Physical Exam Cardiovascular:     Rate and Rhythm: Normal rate and regular rhythm.     Heart sounds: Normal heart sounds.  Pulmonary:     Effort: Pulmonary effort is normal.     Breath sounds: Normal breath sounds.  Skin:    General: Skin is warm and dry.  Neurological:     Mental Status: She is alert and oriented to person, place, and time.      I have personally reviewed labs listed below:    Latest Ref Rng & Units 11/12/2024    1:42 PM  CMP  Glucose 70 - 99 mg/dL 877   BUN 8 - 23 mg/dL 14   Creatinine 9.55 - 1.00 mg/dL 9.24   Sodium 864 - 854 mmol/L 139   Potassium 3.5 - 5.1 mmol/L 4.3   Chloride 98 - 111 mmol/L 104   CO2 22 - 32 mmol/L 26   Calcium 8.9 - 10.3 mg/dL 9.6   Total Protein 6.5 - 8.1 g/dL 6.9   Total Bilirubin 0.0 - 1.2 mg/dL <9.7   Alkaline Phos 38 - 126 U/L 70   AST 15 - 41 U/L 20   ALT 0 - 44 U/L 15       Latest Ref Rng & Units 11/12/2024    1:42 PM  CBC  WBC 4.0 - 10.5 K/uL 3.3   Hemoglobin 12.0 - 15.0 g/dL 87.7   Hematocrit 63.9 - 46.0 % 36.1   Platelets 150 - 400 K/uL 194    I have personally reviewed Radiology images listed below: No images are attached to the encounter.  IR IMAGING GUIDED PORT INSERTION Result Date: 10/25/2024 INDICATION: 73 year old female with history of rectal cancer requiring central venous access for chemotherapy administration. EXAM: IMPLANTED PORT A CATH PLACEMENT WITH ULTRASOUND AND FLUOROSCOPIC GUIDANCE COMPARISON:  None Available. MEDICATIONS: None. ANESTHESIA/SEDATION: Moderate (conscious) sedation was employed during this procedure. A total of Versed  2 mg and Fentanyl  100 mcg was administered intravenously. Moderate Sedation Time: 20 minutes. The patient's level of  consciousness and vital signs were monitored continuously by radiology nursing throughout the procedure under my direct supervision.  CONTRAST:  None FLUOROSCOPY TIME:  Two mGy reference air kerma COMPLICATIONS: None immediate. PROCEDURE: The procedure, risks, benefits, and alternatives were explained to the patient. Questions regarding the procedure were encouraged and answered. The patient understands and consents to the procedure. The left neck and chest were prepped with chlorhexidine in a sterile fashion, and a sterile drape was applied covering the operative field. Maximum barrier sterile technique with sterile gowns and gloves were used for the procedure. A timeout was performed prior to the initiation of the procedure. Ultrasound was used to examine the jugular vein which was compressible and free of internal echoes. A skin marker was used to demarcate the planned venotomy and port pocket incision sites. Local anesthesia was provided to these sites and the subcutaneous tunnel track with 1% lidocaine  with 1:100,000 epinephrine . A small incision was created at the jugular access site and blunt dissection was performed of the subcutaneous tissues. Under ultrasound guidance, the jugular vein was accessed with a 21 ga micropuncture needle and an 0.018 wire was inserted to the superior vena cava. Real-time ultrasound guidance was utilized for vascular access including the acquisition of a permanent ultrasound image documenting patency of the accessed vessel. A 5 Fr micopuncture set was then used, through which a 0.035 Rosen wire was passed under fluoroscopic guidance into the inferior vena cava. An 8 Fr dilator was then placed over the wire. A subcutaneous port pocket was then created along the upper chest wall utilizing a combination of sharp and blunt dissection. The pocket was irrigated with sterile saline, packed with gauze, and observed for hemorrhage. A single lumen clear view power injectable port was  chosen for placement. The 8 Fr catheter was tunneled from the port pocket site to the venotomy incision. The port was placed in the pocket. The external catheter was trimmed to appropriate length. The dilator was exchanged for an 8 Fr peel-away sheath under fluoroscopic guidance. The catheter was then placed through the sheath and the sheath was removed. Final catheter positioning was confirmed and documented with a fluoroscopic spot radiograph. The port was accessed with a Huber needle, aspirated, and flushed with heparinized saline. The deep dermal layer of the port pocket incision was closed with interrupted 3-0 Vicryl suture. Dermabond was then placed over the port pocket and neck incisions. The patient tolerated the procedure well without immediate post procedural complication. FINDINGS: After catheter placement, the tip lies within the superior cavoatrial junction. The catheter aspirates and flushes normally and is ready for immediate use. IMPRESSION: Successful placement of a power injectable Port-A-Cath via the left internal jugular vein. The catheter is ready for immediate use. Ester Sides, MD [facility] Electronically Signed   By: Ester Sides M.D.   On: 10/25/2024 15:09     Assessment and plan- Patient is a 73 y.o. 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 Radiation to whole pelvis until January 23rd, followed by targeted boost. Chemotherapy may be held during boost phase if indicated. - Continue concurrent chemotherapy and pelvic radiation until January 23rd. - I am reducing the dose of infusional 5-FU chemotherapy to 1800 mg from this week onwards as compared to week 1 dosing of 2000 mg given  leukopenia/neutropenia.  For week 2 she received reduced dose of 900 mg as she  received radiation only for 3 days and therefore had her pump disconnected on day 3 instead of day 5  Chemotherapy-induced neutropenia Developed chemotherapy-induced neutropenia with progressive decline in white blood cell and neutrophil counts. Current neutrophil count permits continuation of chemotherapy with dose reduction to minimize marrow suppression. White cell growth factors contraindicated during chemoradiation. No bacterial infection. - Initiated chemotherapy dose reduction this week to minimize marrow suppression. - Monitor complete blood count weekly. - Instructed her to promptly report any signs or symptoms of infection. - Hold chemotherapy if neutrophil count falls below threshold or if infection develops. - Consider white cell growth factors after completion of chemoradiation if indicated.         Visit Diagnosis 1. Rectal cancer (HCC)   2. Chemotherapy induced neutropenia   3. Encounter for antineoplastic chemotherapy      Dr. Annah Skene, MD, MPH [facility] [phone] 11/12/2024 2:37 PM                   [1]  Allergies Allergen Reactions   Taxotere [Docetaxel] Other (See Comments) and Anaphylaxis    flush and back pain  [2]  Current Outpatient Medications:    Aspirin 81 MG CAPS, Take by oral route., Disp: , Rfl:    calcium carbonate (OS-CAL) 600 MG TABS tablet, Take 600 mg by mouth daily., Disp: , Rfl:    Cholecalciferol (VITAMIN D3) 25 MCG (1000 UT) CAPS, Take by mouth., Disp: , Rfl:    levothyroxine (SYNTHROID, LEVOTHROID) 75 MCG tablet, Take 75 mcg by mouth daily before breakfast., Disp: , Rfl:    metoprolol succinate (TOPROL-XL) 25 MG 24 hr tablet, Take 25 mg by mouth daily., Disp: , Rfl:    metoprolol tartrate (LOPRESSOR) 25 MG tablet, Take 12.5 mg by mouth 2 (two) times daily., Disp: , Rfl:    Multiple Vitamin (MULTIVITAMIN) tablet,  Take 1 tablet by mouth daily., Disp: , Rfl:    lidocaine -prilocaine  (EMLA ) cream, Apply to affected area once (Patient not taking: Reported on 11/12/2024), Disp: 30 g, Rfl: 3   ondansetron  (ZOFRAN ) 8 MG tablet, Take 1 tablet (8 mg total) by mouth every 8 (eight) hours as needed for nausea or vomiting. (Patient not taking: Reported on 11/12/2024), Disp: 30 tablet, Rfl: 1   polyethylene glycol-electrolytes (NULYTELY) 420 g solution, as directed. (Patient not taking: Reported on 11/12/2024), Disp: , Rfl:    pravastatin (PRAVACHOL) 40 MG tablet, Take 40 mg by mouth daily., Disp: , Rfl:    prochlorperazine  (COMPAZINE ) 10 MG tablet, Take 1 tablet (10 mg total) by mouth every 6 (six) hours as needed for nausea or vomiting. (Patient not taking: Reported on 11/12/2024), Disp: 30 tablet, Rfl: 1 No current facility-administered medications for this visit.  Facility-Administered Medications Ordered in Other Visits:    fluorouracil  (ADRUCIL ) 1,800 mg in sodium chloride  0.9 % 114 mL chemo infusion, 1,800 mg, Intravenous, 4 days, Skene Annah BROCKS, MD  "

## 2024-11-13 ENCOUNTER — Ambulatory Visit
Admission: RE | Admit: 2024-11-13 | Discharge: 2024-11-13 | Disposition: A | Discharge status: Home / Self Care | Source: Ambulatory Visit | Attending: Radiation Oncology | Admitting: Radiation Oncology

## 2024-11-13 ENCOUNTER — Other Ambulatory Visit: Payer: Self-pay

## 2024-11-13 DIAGNOSIS — Z510 Encounter for antineoplastic radiation therapy: Secondary | ICD-10-CM | POA: Diagnosis not present

## 2024-11-13 LAB — RAD ONC ARIA SESSION SUMMARY
Course Elapsed Days: 14
Plan Fractions Treated to Date: 9
Plan Prescribed Dose Per Fraction: 1.8 Gy
Plan Total Fractions Prescribed: 25
Plan Total Prescribed Dose: 45 Gy
Reference Point Dosage Given to Date: 16.2 Gy
Reference Point Session Dosage Given: 1.8 Gy
Session Number: 9

## 2024-11-14 ENCOUNTER — Other Ambulatory Visit: Payer: Self-pay

## 2024-11-14 ENCOUNTER — Ambulatory Visit
Admission: RE | Admit: 2024-11-14 | Discharge: 2024-11-14 | Disposition: A | Discharge status: Home / Self Care | Source: Ambulatory Visit | Attending: Radiation Oncology | Admitting: Radiation Oncology

## 2024-11-14 DIAGNOSIS — Z510 Encounter for antineoplastic radiation therapy: Secondary | ICD-10-CM | POA: Diagnosis not present

## 2024-11-14 LAB — RAD ONC ARIA SESSION SUMMARY
Course Elapsed Days: 15
Plan Fractions Treated to Date: 10
Plan Prescribed Dose Per Fraction: 1.8 Gy
Plan Total Fractions Prescribed: 25
Plan Total Prescribed Dose: 45 Gy
Reference Point Dosage Given to Date: 18 Gy
Reference Point Session Dosage Given: 1.8 Gy
Session Number: 10

## 2024-11-16 ENCOUNTER — Ambulatory Visit
Admission: RE | Admit: 2024-11-16 | Discharge: 2024-11-16 | Disposition: A | Discharge status: Home / Self Care | Source: Ambulatory Visit | Attending: Radiation Oncology | Admitting: Radiation Oncology

## 2024-11-16 ENCOUNTER — Inpatient Hospital Stay: Attending: Oncology

## 2024-11-16 ENCOUNTER — Other Ambulatory Visit: Payer: Self-pay

## 2024-11-16 DIAGNOSIS — Z515 Encounter for palliative care: Secondary | ICD-10-CM | POA: Insufficient documentation

## 2024-11-16 DIAGNOSIS — Z510 Encounter for antineoplastic radiation therapy: Secondary | ICD-10-CM | POA: Diagnosis present

## 2024-11-16 DIAGNOSIS — R197 Diarrhea, unspecified: Secondary | ICD-10-CM | POA: Insufficient documentation

## 2024-11-16 DIAGNOSIS — Z9012 Acquired absence of left breast and nipple: Secondary | ICD-10-CM | POA: Insufficient documentation

## 2024-11-16 DIAGNOSIS — Z5111 Encounter for antineoplastic chemotherapy: Secondary | ICD-10-CM | POA: Insufficient documentation

## 2024-11-16 DIAGNOSIS — T451X5A Adverse effect of antineoplastic and immunosuppressive drugs, initial encounter: Secondary | ICD-10-CM | POA: Insufficient documentation

## 2024-11-16 DIAGNOSIS — R634 Abnormal weight loss: Secondary | ICD-10-CM | POA: Insufficient documentation

## 2024-11-16 DIAGNOSIS — C20 Malignant neoplasm of rectum: Secondary | ICD-10-CM | POA: Insufficient documentation

## 2024-11-16 DIAGNOSIS — Z923 Personal history of irradiation: Secondary | ICD-10-CM | POA: Insufficient documentation

## 2024-11-16 DIAGNOSIS — K5900 Constipation, unspecified: Secondary | ICD-10-CM | POA: Insufficient documentation

## 2024-11-16 DIAGNOSIS — R110 Nausea: Secondary | ICD-10-CM | POA: Insufficient documentation

## 2024-11-16 DIAGNOSIS — Y842 Radiological procedure and radiotherapy as the cause of abnormal reaction of the patient, or of later complication, without mention of misadventure at the time of the procedure: Secondary | ICD-10-CM | POA: Insufficient documentation

## 2024-11-16 DIAGNOSIS — Z950 Presence of cardiac pacemaker: Secondary | ICD-10-CM | POA: Insufficient documentation

## 2024-11-16 DIAGNOSIS — Z853 Personal history of malignant neoplasm of breast: Secondary | ICD-10-CM | POA: Insufficient documentation

## 2024-11-16 LAB — RAD ONC ARIA SESSION SUMMARY
Course Elapsed Days: 17
Plan Fractions Treated to Date: 11
Plan Prescribed Dose Per Fraction: 1.8 Gy
Plan Total Fractions Prescribed: 25
Plan Total Prescribed Dose: 45 Gy
Reference Point Dosage Given to Date: 19.8 Gy
Reference Point Session Dosage Given: 1.8 Gy
Session Number: 11

## 2024-11-19 ENCOUNTER — Ambulatory Visit
Admission: RE | Admit: 2024-11-19 | Discharge: 2024-11-19 | Disposition: A | Discharge status: Home / Self Care | Source: Ambulatory Visit | Attending: Radiation Oncology | Admitting: Radiation Oncology

## 2024-11-19 ENCOUNTER — Other Ambulatory Visit: Payer: Self-pay

## 2024-11-19 ENCOUNTER — Inpatient Hospital Stay

## 2024-11-19 VITALS — BP 151/66 | HR 74 | Temp 98.0°F | Resp 17

## 2024-11-19 DIAGNOSIS — Y842 Radiological procedure and radiotherapy as the cause of abnormal reaction of the patient, or of later complication, without mention of misadventure at the time of the procedure: Secondary | ICD-10-CM | POA: Diagnosis not present

## 2024-11-19 DIAGNOSIS — R110 Nausea: Secondary | ICD-10-CM | POA: Diagnosis not present

## 2024-11-19 DIAGNOSIS — Z853 Personal history of malignant neoplasm of breast: Secondary | ICD-10-CM | POA: Diagnosis not present

## 2024-11-19 DIAGNOSIS — K5900 Constipation, unspecified: Secondary | ICD-10-CM | POA: Diagnosis not present

## 2024-11-19 DIAGNOSIS — C20 Malignant neoplasm of rectum: Secondary | ICD-10-CM | POA: Diagnosis present

## 2024-11-19 DIAGNOSIS — Z510 Encounter for antineoplastic radiation therapy: Secondary | ICD-10-CM | POA: Diagnosis not present

## 2024-11-19 DIAGNOSIS — Z950 Presence of cardiac pacemaker: Secondary | ICD-10-CM | POA: Diagnosis not present

## 2024-11-19 DIAGNOSIS — R634 Abnormal weight loss: Secondary | ICD-10-CM | POA: Diagnosis not present

## 2024-11-19 DIAGNOSIS — Z5111 Encounter for antineoplastic chemotherapy: Secondary | ICD-10-CM | POA: Diagnosis present

## 2024-11-19 DIAGNOSIS — T451X5A Adverse effect of antineoplastic and immunosuppressive drugs, initial encounter: Secondary | ICD-10-CM | POA: Diagnosis not present

## 2024-11-19 DIAGNOSIS — Z923 Personal history of irradiation: Secondary | ICD-10-CM | POA: Diagnosis not present

## 2024-11-19 DIAGNOSIS — R197 Diarrhea, unspecified: Secondary | ICD-10-CM | POA: Diagnosis not present

## 2024-11-19 DIAGNOSIS — Z9012 Acquired absence of left breast and nipple: Secondary | ICD-10-CM | POA: Diagnosis not present

## 2024-11-19 DIAGNOSIS — Z515 Encounter for palliative care: Secondary | ICD-10-CM | POA: Diagnosis not present

## 2024-11-19 LAB — CBC WITH DIFFERENTIAL (CANCER CENTER ONLY)
Abs Immature Granulocytes: 0.02 K/uL (ref 0.00–0.07)
Basophils Absolute: 0 K/uL (ref 0.0–0.1)
Basophils Relative: 1 %
Eosinophils Absolute: 0.2 K/uL (ref 0.0–0.5)
Eosinophils Relative: 6 %
HCT: 34.6 % — ABNORMAL LOW (ref 36.0–46.0)
Hemoglobin: 11.8 g/dL — ABNORMAL LOW (ref 12.0–15.0)
Immature Granulocytes: 1 %
Lymphocytes Relative: 21 %
Lymphs Abs: 0.8 K/uL (ref 0.7–4.0)
MCH: 32.3 pg (ref 26.0–34.0)
MCHC: 34.1 g/dL (ref 30.0–36.0)
MCV: 94.8 fL (ref 80.0–100.0)
Monocytes Absolute: 0.3 K/uL (ref 0.1–1.0)
Monocytes Relative: 7 %
Neutro Abs: 2.5 K/uL (ref 1.7–7.7)
Neutrophils Relative %: 64 %
Platelet Count: 188 K/uL (ref 150–400)
RBC: 3.65 MIL/uL — ABNORMAL LOW (ref 3.87–5.11)
RDW: 12.9 % (ref 11.5–15.5)
WBC Count: 3.9 K/uL — ABNORMAL LOW (ref 4.0–10.5)
nRBC: 0 % (ref 0.0–0.2)

## 2024-11-19 LAB — CMP (CANCER CENTER ONLY)
ALT: 15 U/L (ref 0–44)
AST: 20 U/L (ref 15–41)
Albumin: 4 g/dL (ref 3.5–5.0)
Alkaline Phosphatase: 69 U/L (ref 38–126)
Anion gap: 8 (ref 5–15)
BUN: 18 mg/dL (ref 8–23)
CO2: 25 mmol/L (ref 22–32)
Calcium: 9.6 mg/dL (ref 8.9–10.3)
Chloride: 101 mmol/L (ref 98–111)
Creatinine: 0.73 mg/dL (ref 0.44–1.00)
GFR, Estimated: >60 mL/min
Glucose, Bld: 92 mg/dL (ref 70–99)
Potassium: 4.2 mmol/L (ref 3.5–5.1)
Sodium: 135 mmol/L (ref 135–145)
Total Bilirubin: <0.2 mg/dL (ref 0.0–1.2)
Total Protein: 6.8 g/dL (ref 6.5–8.1)

## 2024-11-19 LAB — RAD ONC ARIA SESSION SUMMARY
Course Elapsed Days: 20
Plan Fractions Treated to Date: 12
Plan Prescribed Dose Per Fraction: 1.8 Gy
Plan Total Fractions Prescribed: 25
Plan Total Prescribed Dose: 45 Gy
Reference Point Dosage Given to Date: 21.6 Gy
Reference Point Session Dosage Given: 1.8 Gy
Session Number: 12

## 2024-11-19 MED ORDER — SODIUM CHLORIDE 0.9 % IV SOLN
1800.0000 mg | INTRAVENOUS | Status: DC
  Administered 2024-11-19: 1800 mg via INTRAVENOUS
  Filled 2024-11-19: qty 36

## 2024-11-19 MED ORDER — SODIUM CHLORIDE 0.9% FLUSH
10.0000 mL | INTRAVENOUS | Status: DC | PRN
  Administered 2024-11-19: 10 mL
  Filled 2024-11-19: qty 10

## 2024-11-19 NOTE — Patient Instructions (Signed)
 [facility] - A DEPT OF [facility]  Discharge Instructions: Thank you for choosing [facility] to provide your oncology and hematology care.  If you have a lab appointment with the Cancer Center, please go directly to the Cancer Center and check in at the registration area.  Wear comfortable clothing and clothing appropriate for easy access to any Portacath or PICC line.   We strive to give you quality time with your provider. You may need to reschedule your appointment if you arrive late (15 or more minutes).  Arriving late affects you and other patients whose appointments are after yours.  Also, if you miss three or more appointments without notifying the office, you may be dismissed from the clinic at the provider's discretion.      For prescription refill requests, have your pharmacy contact our office and allow 72 hours for refills to be completed.    Today you received the following chemotherapy and/or immunotherapy agents- 5FU      To help prevent nausea and vomiting after your treatment, we encourage you to take your nausea medication as directed.  BELOW ARE SYMPTOMS THAT SHOULD BE REPORTED IMMEDIATELY: *FEVER GREATER THAN 100.4 F (38 C) OR HIGHER *CHILLS OR SWEATING *NAUSEA AND VOMITING THAT IS NOT CONTROLLED WITH YOUR NAUSEA MEDICATION *UNUSUAL SHORTNESS OF BREATH *UNUSUAL BRUISING OR BLEEDING *URINARY PROBLEMS (pain or burning when urinating, or frequent urination) *BOWEL PROBLEMS (unusual diarrhea, constipation, pain near the anus) TENDERNESS IN MOUTH AND THROAT WITH OR WITHOUT PRESENCE OF ULCERS (sore throat, sores in mouth, or a toothache) UNUSUAL RASH, SWELLING OR PAIN  UNUSUAL VAGINAL DISCHARGE OR ITCHING   Items with * indicate a potential emergency and should be followed up as soon as possible or go to the Emergency Department if any problems should occur.  Please show the CHEMOTHERAPY ALERT CARD or IMMUNOTHERAPY ALERT  CARD at check-in to the Emergency Department and triage nurse.  Should you have questions after your visit or need to cancel or reschedule your appointment, please contact [facility] - A DEPT OF [facility]  [phone] and follow the prompts.  Office hours are 8:00 a.m. to 4:30 p.m. Monday - Friday. Please note that voicemails left after 4:00 p.m. may not be returned until the following business day.  We are closed weekends and major holidays. You have access to a nurse at all times for urgent questions. Please call the main number to the clinic [phone] and follow the prompts.  For any non-urgent questions, you may also contact your provider using MyChart. We now offer e-Visits for anyone 66 and older to request care online for non-urgent symptoms. For details visit [email or web address].   Also download the MyChart app! Go to the app store, search MyChart, open the app, select , and log in with your MyChart username and password.

## 2024-11-20 ENCOUNTER — Other Ambulatory Visit: Payer: Self-pay

## 2024-11-20 ENCOUNTER — Ambulatory Visit
Admission: RE | Admit: 2024-11-20 | Discharge: 2024-11-20 | Disposition: A | Source: Ambulatory Visit | Attending: Radiation Oncology | Admitting: Radiation Oncology

## 2024-11-20 DIAGNOSIS — Z510 Encounter for antineoplastic radiation therapy: Secondary | ICD-10-CM | POA: Diagnosis not present

## 2024-11-20 LAB — RAD ONC ARIA SESSION SUMMARY
Course Elapsed Days: 21
Plan Fractions Treated to Date: 13
Plan Prescribed Dose Per Fraction: 1.8 Gy
Plan Total Fractions Prescribed: 25
Plan Total Prescribed Dose: 45 Gy
Reference Point Dosage Given to Date: 23.4 Gy
Reference Point Session Dosage Given: 1.8 Gy
Session Number: 13

## 2024-11-21 ENCOUNTER — Ambulatory Visit
Admission: RE | Admit: 2024-11-21 | Discharge: 2024-11-21 | Disposition: A | Discharge status: Home / Self Care | Source: Ambulatory Visit | Attending: Radiation Oncology | Admitting: Radiation Oncology

## 2024-11-21 ENCOUNTER — Other Ambulatory Visit: Payer: Self-pay

## 2024-11-21 DIAGNOSIS — Z510 Encounter for antineoplastic radiation therapy: Secondary | ICD-10-CM | POA: Diagnosis not present

## 2024-11-21 LAB — RAD ONC ARIA SESSION SUMMARY
Course Elapsed Days: 22
Plan Fractions Treated to Date: 14
Plan Prescribed Dose Per Fraction: 1.8 Gy
Plan Total Fractions Prescribed: 25
Plan Total Prescribed Dose: 45 Gy
Reference Point Dosage Given to Date: 25.2 Gy
Reference Point Session Dosage Given: 1.8 Gy
Session Number: 14

## 2024-11-22 ENCOUNTER — Other Ambulatory Visit: Payer: Self-pay

## 2024-11-22 ENCOUNTER — Ambulatory Visit
Admission: RE | Admit: 2024-11-22 | Discharge: 2024-11-22 | Disposition: A | Discharge status: Home / Self Care | Source: Ambulatory Visit | Attending: Radiation Oncology | Admitting: Radiation Oncology

## 2024-11-22 DIAGNOSIS — Z510 Encounter for antineoplastic radiation therapy: Secondary | ICD-10-CM | POA: Diagnosis not present

## 2024-11-22 LAB — RAD ONC ARIA SESSION SUMMARY
Course Elapsed Days: 23
Plan Fractions Treated to Date: 15
Plan Prescribed Dose Per Fraction: 1.8 Gy
Plan Total Fractions Prescribed: 25
Plan Total Prescribed Dose: 45 Gy
Reference Point Dosage Given to Date: 27 Gy
Reference Point Session Dosage Given: 1.8 Gy
Session Number: 15

## 2024-11-23 ENCOUNTER — Ambulatory Visit
Admission: RE | Admit: 2024-11-23 | Discharge: 2024-11-23 | Disposition: A | Source: Ambulatory Visit | Attending: Radiation Oncology | Admitting: Radiation Oncology

## 2024-11-23 ENCOUNTER — Other Ambulatory Visit: Payer: Self-pay

## 2024-11-23 ENCOUNTER — Inpatient Hospital Stay

## 2024-11-23 DIAGNOSIS — Z510 Encounter for antineoplastic radiation therapy: Secondary | ICD-10-CM | POA: Diagnosis not present

## 2024-11-23 LAB — RAD ONC ARIA SESSION SUMMARY
Course Elapsed Days: 24
Plan Fractions Treated to Date: 16
Plan Prescribed Dose Per Fraction: 1.8 Gy
Plan Total Fractions Prescribed: 25
Plan Total Prescribed Dose: 45 Gy
Reference Point Dosage Given to Date: 28.8 Gy
Reference Point Session Dosage Given: 1.8 Gy
Session Number: 16

## 2024-11-26 ENCOUNTER — Inpatient Hospital Stay

## 2024-11-26 ENCOUNTER — Other Ambulatory Visit: Payer: Self-pay

## 2024-11-26 ENCOUNTER — Inpatient Hospital Stay: Admitting: Oncology

## 2024-11-26 ENCOUNTER — Ambulatory Visit
Admission: RE | Admit: 2024-11-26 | Discharge: 2024-11-26 | Disposition: A | Source: Ambulatory Visit | Attending: Radiation Oncology | Admitting: Radiation Oncology

## 2024-11-26 ENCOUNTER — Encounter: Payer: Self-pay | Admitting: Oncology

## 2024-11-26 ENCOUNTER — Ambulatory Visit: Admitting: Dermatology

## 2024-11-26 ENCOUNTER — Encounter: Payer: Self-pay | Admitting: Dermatology

## 2024-11-26 VITALS — BP 133/58 | HR 72 | Temp 97.9°F | Resp 20 | Wt 170.7 lb

## 2024-11-26 DIAGNOSIS — C20 Malignant neoplasm of rectum: Secondary | ICD-10-CM

## 2024-11-26 DIAGNOSIS — D239 Other benign neoplasm of skin, unspecified: Secondary | ICD-10-CM

## 2024-11-26 DIAGNOSIS — D2239 Melanocytic nevi of other parts of face: Secondary | ICD-10-CM

## 2024-11-26 DIAGNOSIS — Z5111 Encounter for antineoplastic chemotherapy: Secondary | ICD-10-CM | POA: Diagnosis not present

## 2024-11-26 DIAGNOSIS — Z510 Encounter for antineoplastic radiation therapy: Secondary | ICD-10-CM | POA: Diagnosis not present

## 2024-11-26 DIAGNOSIS — C4492 Squamous cell carcinoma of skin, unspecified: Secondary | ICD-10-CM

## 2024-11-26 LAB — CMP (CANCER CENTER ONLY)
ALT: 14 U/L (ref 0–44)
AST: 20 U/L (ref 15–41)
Albumin: 4 g/dL (ref 3.5–5.0)
Alkaline Phosphatase: 67 U/L (ref 38–126)
Anion gap: 9 (ref 5–15)
BUN: 14 mg/dL (ref 8–23)
CO2: 26 mmol/L (ref 22–32)
Calcium: 9.7 mg/dL (ref 8.9–10.3)
Chloride: 103 mmol/L (ref 98–111)
Creatinine: 0.8 mg/dL (ref 0.44–1.00)
GFR, Estimated: >60 mL/min
Glucose, Bld: 104 mg/dL — ABNORMAL HIGH (ref 70–99)
Potassium: 4.2 mmol/L (ref 3.5–5.1)
Sodium: 138 mmol/L (ref 135–145)
Total Bilirubin: 0.4 mg/dL (ref 0.0–1.2)
Total Protein: 6.8 g/dL (ref 6.5–8.1)

## 2024-11-26 LAB — RAD ONC ARIA SESSION SUMMARY
Course Elapsed Days: 27
Plan Fractions Treated to Date: 17
Plan Prescribed Dose Per Fraction: 1.8 Gy
Plan Total Fractions Prescribed: 25
Plan Total Prescribed Dose: 45 Gy
Reference Point Dosage Given to Date: 30.6 Gy
Reference Point Session Dosage Given: 1.8 Gy
Session Number: 17

## 2024-11-26 LAB — CBC WITH DIFFERENTIAL (CANCER CENTER ONLY)
Abs Immature Granulocytes: 0.03 K/uL (ref 0.00–0.07)
Basophils Absolute: 0 K/uL (ref 0.0–0.1)
Basophils Relative: 1 %
Eosinophils Absolute: 0.3 K/uL (ref 0.0–0.5)
Eosinophils Relative: 7 %
HCT: 37.4 % (ref 36.0–46.0)
Hemoglobin: 12.6 g/dL (ref 12.0–15.0)
Immature Granulocytes: 1 %
Lymphocytes Relative: 16 %
Lymphs Abs: 0.7 K/uL (ref 0.7–4.0)
MCH: 32.1 pg (ref 26.0–34.0)
MCHC: 33.7 g/dL (ref 30.0–36.0)
MCV: 95.4 fL (ref 80.0–100.0)
Monocytes Absolute: 0.4 K/uL (ref 0.1–1.0)
Monocytes Relative: 8 %
Neutro Abs: 3 K/uL (ref 1.7–7.7)
Neutrophils Relative %: 67 %
Platelet Count: 214 K/uL (ref 150–400)
RBC: 3.92 MIL/uL (ref 3.87–5.11)
RDW: 13.2 % (ref 11.5–15.5)
WBC Count: 4.5 K/uL (ref 4.0–10.5)
nRBC: 0 % (ref 0.0–0.2)

## 2024-11-26 MED ORDER — SODIUM CHLORIDE 0.9 % IV SOLN
1800.0000 mg | INTRAVENOUS | Status: DC
  Administered 2024-11-26: 1800 mg via INTRAVENOUS
  Filled 2024-11-26: qty 36

## 2024-11-26 NOTE — Progress Notes (Signed)
 "    Hematology/Oncology Consult note [facility]  Telephone:([phone] [phone]  Patient Care Team: Sadie Manna, MD as PCP - General (Internal Medicine) Maurie Rayfield BIRCH, RN as Oncology Nurse Navigator Melanee Annah BROCKS, MD as Consulting Physician (Oncology)   Name of the patient: Kathy Wood  [id number]  1951-07-21   Date of visit: 11/26/2024  Diagnosis-  Cancer Staging  Cancer of left breast [facility]) Staging form: Breast, AJCC 7th Edition - Clinical: Stage IA (T1c, N0, M0) - Unsigned Laterality: Left  Rectal cancer [facility]) Staging form: Colon and Rectum, AJCC 8th Edition - Clinical stage from 09/30/2024: Stage IIIB (cT3, cN1a, cM0) - Signed by Melanee Annah BROCKS, MD on 09/30/2024 Histopathologic type: Adenocarcinoma, NOS Total positive nodes: 1    Chief complaint/ Reason for visit- on treatment assessment prior to week 5 of infusional 5-fu chemotherapy  Heme/Onc history:  Kathy Wood is a 74 year old female with a history of breast cancer who presents with rectal bleeding and a newly diagnosed rectal mass. She was referred by [facility] GI NPChristiane London for evaluation of rectal bleeding.   She has experienced intermittent rectal bleeding since the summer, with the initial episode not recurring. Colonoscopy showed nonobstructing medium-sized mass 3 to 5 cm from the anus measuring 2 cm in size.  Biopsy was consistent with moderate to poorly differentiated adenocarcinoma. MSI stable disease.    Her last colonoscopy in 2017 showed no abnormalities, and she has not undergone any other related procedures since then.   She has a history of breast cancer treated with mastectomy and chemotherapy 15 years ago. Genetic testing five years ago did not reveal significant findings. No family history of colon, breast, or stomach cancer.   She denies other symptoms such as pain or pressure.    MRI pelvis on 09/25/2024 showed T3a N1 disease with  possible involvement of levator ani musculature.  Distance of the tumor from anal verge 5 cm.  Tumor distance from internal anal sphincter 2.5 cm.  Possible small volume extension at 7 o'clock position less than 1 mm T3a.  High mesorectal lymph node 8 mm. N1.    Patient has been seen by [facility] colorectal surgery and plan is for total neoadjuvant treatment with concurrent chemoradiation with infusional 5-FU followed by FOLFOX chemotherapy for 4 months and consideration for surgery versus wait and watch approach      Interval history- [name] is a 74 year old female with locally advanced rectal cancer undergoing concurrent chemoradiation who presents for management of treatment-related diarrhea and rectal soreness.  She is currently receiving her fifth cycle of infusional 5-fluorouracil  (5FU) in conjunction with radiation therapy. She is midway through her planned course of treatment.  Over the past weekend, she experienced a single episode of diarrhea, which resolved with one dose of loperamide. She has had no further episodes and has not required additional antidiarrheal therapy.  She reports increasing rectal soreness but denies any burning sensation. She applies topical emollients for symptomatic relief and maintains good hygiene. She notes feeling worse over the most recent weekend, attributing this to cumulative effects of radiation, but has not developed new or worsening symptoms beyond mild soreness and transient diarrhea.       ECOG PS- 1 Pain scale- 0   Review of systems- Review of Systems  Constitutional:  Negative for chills, fever, malaise/fatigue and weight loss.  HENT:  Negative for congestion, ear discharge and nosebleeds.   Eyes:  Negative for blurred  vision.  Respiratory:  Negative for cough, hemoptysis, sputum production, shortness of breath and wheezing.   Cardiovascular:  Negative for chest pain, palpitations, orthopnea and claudication.  Gastrointestinal:   Positive for diarrhea. Negative for abdominal pain, blood in stool, constipation, heartburn, melena, nausea and vomiting.  Genitourinary:  Negative for dysuria, flank pain, frequency, hematuria and urgency.  Musculoskeletal:  Negative for back pain, joint pain and myalgias.  Skin:  Negative for rash.  Neurological:  Negative for dizziness, tingling, focal weakness, seizures, weakness and headaches.  Endo/Heme/Allergies:  Does not bruise/bleed easily.  Psychiatric/Behavioral:  Negative for depression and suicidal ideas. The patient does not have insomnia.       Allergies[1]   Past Medical History:  Diagnosis Date   Actinic keratosis    Aortic valvar stenosis    Valve replacement. [facility]. 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 Medtronic Azure Xt Dr Levada Pauling. [id number]   Rectal cancer [facility])      Past Surgical History:  Procedure Laterality Date   AUGMENTATION MAMMAPLASTY Left 2010   mastectomy   BREAST LUMPECTOMY Left 2006   positive   CARDIAC CATHETERIZATION  11/29/2018   [facility]   CARDIAC VALVE REPLACEMENT  12/13/2018   [facility]. Aortic Valve.   CATARACT EXTRACTION W/PHACO Left 05/22/2019   Procedure: CATARACT EXTRACTION PHACO AND INTRAOCULAR LENS PLACEMENT (IOC) left;  Surgeon: Jaye Fallow, MD;  Location: [facility];  Service: Ophthalmology;  Laterality: Left;   CATARACT EXTRACTION W/PHACO Right 06/12/2019   Procedure: CATARACT EXTRACTION PHACO AND INTRAOCULAR LENS PLACEMENT (IOC)  RIGHT;  Surgeon: Jaye Fallow, MD;  Location: [facility];  Service: Ophthalmology;   Laterality: Right;   COLONOSCOPY N/A 09/03/2024   Procedure: COLONOSCOPY;  Surgeon: Therisa Bi, MD;  Location: [facility] ENDOSCOPY;  Service: Gastroenterology;  Laterality: N/A;  ANTIBIOTICS BEFORE PROCEDURE PER OFFICE   COLONOSCOPY WITH PROPOFOL  N/A 10/29/2016   Procedure: COLONOSCOPY WITH PROPOFOL ;  Surgeon: Gladis RAYMOND Mariner, MD;  Location: [facility] ENDOSCOPY;  Service: Endoscopy;  Laterality: N/A;   EYE SURGERY     INSERT / REPLACE / REMOVE PACEMAKER  12/15/2018   [facility].  Medtronic Azure Xt Dr Levada Pauling [id number]   IR IMAGING GUIDED PORT INSERTION  10/25/2024   MASTECTOMY Left 2010   RECONSTRUCTIVE REPAIR STERNAL Left    REDUCTION MAMMAPLASTY Right 2010    Social History   Socioeconomic History   Marital status: Married    Spouse name: Rin Gorton   Number of children: 3   Years of education: Not on file   Highest education level: Not on file  Occupational History   Not on file  Tobacco Use   Smoking status: Never   Smokeless tobacco: Never  Vaping Use   Vaping status: Never Used  Substance and Sexual Activity   Alcohol use: Yes    Comment: rare drinker   Drug use: No   Sexual activity: Yes  Other Topics Concern   Not on file  Social History Narrative   Not on file   Social Drivers of  Health   Tobacco Use: Low Risk (11/26/2024)   Patient History    Smoking Tobacco Use: Never    Smokeless Tobacco Use: Never    Passive Exposure: Not on file  Financial Resource Strain: Low Risk  (08/13/2024)   Received from [facility]   Overall Financial Resource Strain (CARDIA)    Difficulty of Paying Living Expenses: Not hard at all  Food Insecurity: No Food Insecurity (09/05/2024)   Epic    Worried About Running Out of Food in the Last Year: Never true    Ran Out of Food in the Last Year: Never true  Transportation Needs: No Transportation Needs (09/05/2024)   Epic    Lack of Transportation (Medical): No    Lack of Transportation (Non-Medical): No  Physical  Activity: Not on file  Stress: Not on file  Social Connections: Not on file  Intimate Partner Violence: Not At Risk (09/05/2024)   Epic    Fear of Current or Ex-Partner: No    Emotionally Abused: No    Physically Abused: No    Sexually Abused: No  Depression (PHQ2-9): Low Risk (11/26/2024)   Depression (PHQ2-9)    PHQ-2 Score: 0  Alcohol Screen: Not on file  Housing: Low Risk (09/05/2024)   Epic    Unable to Pay for Housing in the Last Year: No    Number of Times Moved in the Last Year: 0    Homeless in the Last Year: No  Utilities: Not At Risk (09/05/2024)   Epic    Threatened with loss of utilities: No  Health Literacy: Not on file    Family History  Problem Relation Age of Onset   Breast cancer Neg Hx     Current Medications[2]  Physical exam:  Vitals:   11/26/24 0943  BP: (!) 133/58  Pulse: 72  Resp: 20  Temp: 97.9 F (36.6 C)  TempSrc: Tympanic  SpO2: 100%  Weight: 170 lb 11.2 oz (77.4 kg)   Physical Exam Cardiovascular:     Rate and Rhythm: Normal rate and regular rhythm.     Heart sounds: Normal heart sounds.  Pulmonary:     Effort: Pulmonary effort is normal.     Breath sounds: Normal breath sounds.  Abdominal:     General: Bowel sounds are normal.     Palpations: Abdomen is soft.     Comments: No significant erythema in the perianal area  Skin:    General: Skin is warm and dry.  Neurological:     Mental Status: She is alert and oriented to person, place, and time.      I have personally reviewed labs listed below:    Latest Ref Rng & Units 11/26/2024    9:06 AM  CMP  Glucose 70 - 99 mg/dL 895   BUN 8 - 23 mg/dL 14   Creatinine 9.55 - 1.00 mg/dL 9.19   Sodium 864 - 854 mmol/L 138   Potassium 3.5 - 5.1 mmol/L 4.2   Chloride 98 - 111 mmol/L 103   CO2 22 - 32 mmol/L 26   Calcium 8.9 - 10.3 mg/dL 9.7   Total Protein 6.5 - 8.1 g/dL 6.8   Total Bilirubin 0.0 - 1.2 mg/dL 0.4   Alkaline Phos 38 - 126 U/L 67   AST 15 - 41 U/L 20   ALT 0 - 44  U/L 14       Latest Ref Rng & Units 11/26/2024    9:06 AM  CBC  WBC 4.0 - 10.5 K/uL 4.5   Hemoglobin 12.0 - 15.0 g/dL 87.3   Hematocrit 63.9 - 46.0 % 37.4   Platelets 150 - 400 K/uL 214      Assessment and plan- Patient is a 74 y.o. 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  She is here for on treatment assessment prior to week 5 of infusional 5-FU chemotherapy  Assessment and Plan    Rectal cancer Undergoing concurrent chemoradiation with reduced dose infusional 5FU. Laboratory values improved, no acute complications. Halfway through treatment with anticipated increased side effects. Future chemotherapy dosing during boost phase based on tolerance and labs.   - Continued infusional 5FU at reduced dose during radiation.  She will be completing radiation treatment in 3 weeks. - Planned chemotherapy administration next week and during boost phase, with possible discontinuation during boost depending on tolerance and labs. - Transition to higher-dose 5FU every other week after radiation, with pump removal after 48 hours. -  MRI at [facility] planned for post-treatment assessment. - Surgical evaluation after completion of total neoadjuvant therapy  Diarrhea secondary to chemoradiation Single episode of diarrhea resolved with loperamide. Mild rectal soreness. Diarrhea due to radiation-induced rectal irritation. - Advised loperamide as needed for diarrhea.         Visit Diagnosis 1. Rectal cancer (HCC)   2. Encounter for antineoplastic chemotherapy      Dr. Annah Skene, MD, MPH [facility] [phone] 11/26/2024 12:52 PM                   [1]  Allergies Allergen Reactions   Taxotere [Docetaxel] Other (See Comments) and Anaphylaxis    flush and back pain  [2]  Current Outpatient Medications:    Aspirin 81 MG CAPS, Take by oral route., Disp: , Rfl:    calcium carbonate (OS-CAL) 600 MG TABS tablet, Take 600 mg by  mouth daily., Disp: , Rfl:    Cholecalciferol (VITAMIN D3) 25 MCG (1000 UT) CAPS, Take by mouth., Disp: , Rfl:    levothyroxine (SYNTHROID, LEVOTHROID) 75 MCG tablet, Take 75 mcg by mouth daily before breakfast., Disp: , Rfl:    metoprolol succinate (TOPROL-XL) 25 MG 24 hr tablet, Take 25 mg by mouth daily., Disp: , Rfl:    metoprolol tartrate (LOPRESSOR) 25 MG tablet, Take 12.5 mg by mouth 2 (two) times daily., Disp: , Rfl:    Multiple Vitamin (MULTIVITAMIN) tablet, Take 1 tablet by mouth daily., Disp: , Rfl:    pravastatin (PRAVACHOL) 40 MG tablet, Take 40 mg by mouth daily., Disp: , Rfl:    lidocaine -prilocaine  (EMLA ) cream, Apply to affected area once (Patient not taking: Reported on 11/26/2024), Disp: 30 g, Rfl: 3   ondansetron  (ZOFRAN ) 8 MG tablet, Take 1 tablet (8 mg total) by mouth every 8 (eight) hours as needed for nausea or vomiting. (Patient not taking: Reported on 11/26/2024), Disp: 30 tablet, Rfl: 1   polyethylene glycol-electrolytes (NULYTELY) 420 g solution, as directed. (Patient not taking: Reported on 11/26/2024), Disp: , Rfl:    prochlorperazine  (COMPAZINE ) 10 MG tablet, Take 1 tablet (10 mg total) by mouth every 6 (six) hours as needed for nausea or vomiting. (Patient not taking: Reported on 11/26/2024), Disp: 30 tablet, Rfl: 1 No current facility-administered medications for this visit.  Facility-Administered Medications Ordered in Other Visits:    fluorouracil  (ADRUCIL ) 1,800 mg in sodium chloride  0.9 % 114 mL chemo infusion, 1,800 mg, Intravenous, 4 days, Skene Annah BROCKS, MD,  1,800 mg at 11/26/24 1051  "

## 2024-11-26 NOTE — Patient Instructions (Signed)
 [facility] - A DEPT OF [facility]  Discharge Instructions: Thank you for choosing [facility] to provide your oncology and hematology care.  If you have a lab appointment with the Cancer Center, please go directly to the Cancer Center and check in at the registration area.  Wear comfortable clothing and clothing appropriate for easy access to any Portacath or PICC line.   We strive to give you quality time with your provider. You may need to reschedule your appointment if you arrive late (15 or more minutes).  Arriving late affects you and other patients whose appointments are after yours.  Also, if you miss three or more appointments without notifying the office, you may be dismissed from the clinic at the provider's discretion.      For prescription refill requests, have your pharmacy contact our office and allow 72 hours for refills to be completed.    Today you received the following chemotherapy and/or immunotherapy agents- 5FU      To help prevent nausea and vomiting after your treatment, we encourage you to take your nausea medication as directed.  BELOW ARE SYMPTOMS THAT SHOULD BE REPORTED IMMEDIATELY: *FEVER GREATER THAN 100.4 F (38 C) OR HIGHER *CHILLS OR SWEATING *NAUSEA AND VOMITING THAT IS NOT CONTROLLED WITH YOUR NAUSEA MEDICATION *UNUSUAL SHORTNESS OF BREATH *UNUSUAL BRUISING OR BLEEDING *URINARY PROBLEMS (pain or burning when urinating, or frequent urination) *BOWEL PROBLEMS (unusual diarrhea, constipation, pain near the anus) TENDERNESS IN MOUTH AND THROAT WITH OR WITHOUT PRESENCE OF ULCERS (sore throat, sores in mouth, or a toothache) UNUSUAL RASH, SWELLING OR PAIN  UNUSUAL VAGINAL DISCHARGE OR ITCHING   Items with * indicate a potential emergency and should be followed up as soon as possible or go to the Emergency Department if any problems should occur.  Please show the CHEMOTHERAPY ALERT CARD or IMMUNOTHERAPY ALERT  CARD at check-in to the Emergency Department and triage nurse.  Should you have questions after your visit or need to cancel or reschedule your appointment, please contact [facility] - A DEPT OF [facility]  [phone] and follow the prompts.  Office hours are 8:00 a.m. to 4:30 p.m. Monday - Friday. Please note that voicemails left after 4:00 p.m. may not be returned until the following business day.  We are closed weekends and major holidays. You have access to a nurse at all times for urgent questions. Please call the main number to the clinic [phone] and follow the prompts.  For any non-urgent questions, you may also contact your provider using MyChart. We now offer e-Visits for anyone 66 and older to request care online for non-urgent symptoms. For details visit [email or web address].   Also download the MyChart app! Go to the app store, search MyChart, open the app, select , and log in with your MyChart username and password.

## 2024-11-26 NOTE — Patient Instructions (Signed)

## 2024-11-26 NOTE — Progress Notes (Signed)
 "  New Patient Visit    History of Present Illness Kathy Wood is a 74 year old female who presents with a nasal lesion. She was referred by Dr. Hester for evaluation of a nasal lesion.  She is currently undergoing treatment for rectal cancer, which includes daily radiation therapy from Monday to Friday and continuous infusion chemotherapy via a pump during the same days, with weekends off. This treatment regimen is expected to continue until the end of January, totaling a six-month course of chemotherapy. The treatment has been challenging, with side effects such as burning and discomfort from the radiation.  She has a nasal lesion that was biopsied and identified as a trichlemomma, a benign lesion. However, the pathology report indicated some cytologic atypia and abnormal cell division, and the possibility of carcinoma could not be excluded. The lesion is not visibly growing back externally at this time.  Her past medical history includes a previous diagnosis of breast cancer, for which she underwent treatment, and she has a history of significant sun exposure during her childhood in the [place], leading to frequent sunburns.  Patient states she has spot located at the R alar rim that she would like to have examined. Patient reports the areas have been there for 3 months. She reports the areas are not bothersome. Patient reports she has not previously been treated for these areas. Patient reports Hx of bx. Patient denies family history of skin cancer(s).  Patient has been diagnose with rectal cancer two months ago. She is undergoing chemotherapy and radiation for the next 6 months.   The following portions of the chart were reviewed this encounter and updated as appropriate: medications, allergies, medical history  Review of Systems:  No other skin or systemic complaints except as noted in HPI or Assessment and Plan.  Objective  Well appearing patient in no apparent distress;  mood and affect are within normal limits.   A focused examination was performed of the following areas: R alar rim   Relevant exam findings are noted in the Assessment and Plan.       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 Discussed that her currently chemotherapy treatment can cause delays in healing.  Nasal skin lesion with cytologic atypia, cannot exclude carcinoma Nasal lesion shows cytologic atypia, raising concern for carcinoma. Pathology report indicates carcinoma cannot be excluded. Mohs surgery recommended for precise removal. Current rectal cancer treatment may delay healing. - Schedule Mohs surgery in 4-6 weeks, pending rectal cancer treatment changes. - Proceed with Mohs surgery if she prioritizes skin lesion treatment. - Discuss potential healing delay due to chemotherapy and plan accordingly.  The patient was counseled thoroughly regarding the plan for Mohs micrographic surgery to treat their skin cancer. We discussed the procedure in detail, including the removal of thin layers of tissue for examination under a microscope to ensure complete excision of the cancer while preserving as much healthy tissue as possible. The patient was informed that the surgery may take several hours, with potential additional stages if further tissue removal is necessary to achieve clear margins. We reviewed the risks, which include infection, scarring, and the possibility of requiring reconstructive techniques depending on the location and size of the defect. Reconstruction will occur after the tumor is fully cleared, and depending on the wound size and location,  this may involve a flap, graft, linear closure, or sometimes healing by secondary intention. The patient was advised on preoperative and postoperative care,  including avoiding certain medications, managing pain, and caring for the surgical site to prevent complications. They were given an opportunity to ask questions, and all concerns were addressed. The patient expressed understanding and agreed to proceed with the scheduled Mohs surgery. Mohs to be scheduled today  Rectal Cancer - Currently undergoing radiation and chemotherapy  Return in about 5 weeks (around 12/31/2024) for T3 Mohs for right alar rim.  [name] Virgle Boards, Mohs/Dermatology Tech am acting as a neurosurgeon for RUFUS CHRISTELLA HOLY, MD.  We spent 45 min reviewing records, taking the patient history, providing face to face care with the patient, sending prescriptions.   Documentation: I have reviewed the above documentation for accuracy and completeness, and I agree with the above.  RUFUS CHRISTELLA HOLY, MD     "

## 2024-11-27 ENCOUNTER — Other Ambulatory Visit: Payer: Self-pay

## 2024-11-27 ENCOUNTER — Ambulatory Visit
Admission: RE | Admit: 2024-11-27 | Discharge: 2024-11-27 | Disposition: A | Source: Ambulatory Visit | Attending: Radiation Oncology | Admitting: Radiation Oncology

## 2024-11-27 DIAGNOSIS — Z510 Encounter for antineoplastic radiation therapy: Secondary | ICD-10-CM | POA: Diagnosis not present

## 2024-11-27 LAB — RAD ONC ARIA SESSION SUMMARY
Course Elapsed Days: 28
Plan Fractions Treated to Date: 18
Plan Prescribed Dose Per Fraction: 1.8 Gy
Plan Total Fractions Prescribed: 25
Plan Total Prescribed Dose: 45 Gy
Reference Point Dosage Given to Date: 32.4 Gy
Reference Point Session Dosage Given: 1.8 Gy
Session Number: 18

## 2024-11-28 ENCOUNTER — Other Ambulatory Visit: Payer: Self-pay

## 2024-11-28 ENCOUNTER — Ambulatory Visit
Admission: RE | Admit: 2024-11-28 | Discharge: 2024-11-28 | Disposition: A | Discharge status: Home / Self Care | Source: Ambulatory Visit | Attending: Radiation Oncology | Admitting: Radiation Oncology

## 2024-11-28 DIAGNOSIS — Z510 Encounter for antineoplastic radiation therapy: Secondary | ICD-10-CM | POA: Diagnosis not present

## 2024-11-28 LAB — RAD ONC ARIA SESSION SUMMARY
Course Elapsed Days: 29
Plan Fractions Treated to Date: 19
Plan Prescribed Dose Per Fraction: 1.8 Gy
Plan Total Fractions Prescribed: 25
Plan Total Prescribed Dose: 45 Gy
Reference Point Dosage Given to Date: 34.2 Gy
Reference Point Session Dosage Given: 1.8 Gy
Session Number: 19

## 2024-11-29 ENCOUNTER — Ambulatory Visit
Admission: RE | Admit: 2024-11-29 | Discharge: 2024-11-29 | Disposition: A | Source: Ambulatory Visit | Attending: Radiation Oncology | Admitting: Radiation Oncology

## 2024-11-29 ENCOUNTER — Other Ambulatory Visit: Payer: Self-pay

## 2024-11-29 DIAGNOSIS — Z510 Encounter for antineoplastic radiation therapy: Secondary | ICD-10-CM | POA: Diagnosis not present

## 2024-11-29 LAB — RAD ONC ARIA SESSION SUMMARY
Course Elapsed Days: 30
Plan Fractions Treated to Date: 20
Plan Prescribed Dose Per Fraction: 1.8 Gy
Plan Total Fractions Prescribed: 25
Plan Total Prescribed Dose: 45 Gy
Reference Point Dosage Given to Date: 36 Gy
Reference Point Session Dosage Given: 1.8 Gy
Session Number: 20

## 2024-11-30 ENCOUNTER — Ambulatory Visit
Admission: RE | Admit: 2024-11-30 | Discharge: 2024-11-30 | Disposition: A | Discharge status: Home / Self Care | Source: Ambulatory Visit | Attending: Radiation Oncology | Admitting: Radiation Oncology

## 2024-11-30 ENCOUNTER — Inpatient Hospital Stay

## 2024-11-30 ENCOUNTER — Encounter: Payer: Self-pay | Admitting: *Deleted

## 2024-11-30 ENCOUNTER — Other Ambulatory Visit: Payer: Self-pay

## 2024-11-30 ENCOUNTER — Other Ambulatory Visit: Payer: Self-pay | Admitting: *Deleted

## 2024-11-30 DIAGNOSIS — C20 Malignant neoplasm of rectum: Secondary | ICD-10-CM

## 2024-11-30 DIAGNOSIS — Z510 Encounter for antineoplastic radiation therapy: Secondary | ICD-10-CM | POA: Diagnosis not present

## 2024-11-30 LAB — RAD ONC ARIA SESSION SUMMARY
Course Elapsed Days: 31
Plan Fractions Treated to Date: 21
Plan Prescribed Dose Per Fraction: 1.8 Gy
Plan Total Fractions Prescribed: 25
Plan Total Prescribed Dose: 45 Gy
Reference Point Dosage Given to Date: 37.8 Gy
Reference Point Session Dosage Given: 1.8 Gy
Session Number: 21

## 2024-11-30 MED ORDER — DICYCLOMINE HCL 10 MG PO CAPS: 10.0000 mg | ORAL_CAPSULE | Freq: Three times a day (TID) | ORAL | 0 refills | Status: AC

## 2024-11-30 NOTE — Progress Notes (Signed)
 Patient here for pump stop appointment. Pt states she has been having sharp upper abdominal cramping with this treatment. Melanee, MD notified. Prescription prn bentyl  prescription called in per Melanee, MD. Pt called and informed of her new prescription being called in and to take it when she is experiencing abdominal cramping. Pt verified understanding.

## 2024-12-03 ENCOUNTER — Other Ambulatory Visit: Payer: Self-pay | Admitting: Oncology

## 2024-12-03 ENCOUNTER — Inpatient Hospital Stay: Admitting: Hospice and Palliative Medicine

## 2024-12-03 ENCOUNTER — Inpatient Hospital Stay

## 2024-12-03 ENCOUNTER — Ambulatory Visit
Admission: RE | Admit: 2024-12-03 | Discharge: 2024-12-03 | Disposition: A | Source: Ambulatory Visit | Attending: Radiation Oncology | Admitting: Radiation Oncology

## 2024-12-03 ENCOUNTER — Other Ambulatory Visit: Payer: Self-pay

## 2024-12-03 VITALS — BP 139/76 | HR 81 | Temp 98.1°F | Resp 18 | Wt 168.4 lb

## 2024-12-03 DIAGNOSIS — C20 Malignant neoplasm of rectum: Secondary | ICD-10-CM | POA: Diagnosis not present

## 2024-12-03 DIAGNOSIS — Z510 Encounter for antineoplastic radiation therapy: Secondary | ICD-10-CM | POA: Diagnosis not present

## 2024-12-03 DIAGNOSIS — Z5111 Encounter for antineoplastic chemotherapy: Secondary | ICD-10-CM | POA: Diagnosis not present

## 2024-12-03 LAB — RAD ONC ARIA SESSION SUMMARY
Course Elapsed Days: 34
Plan Fractions Treated to Date: 22
Plan Prescribed Dose Per Fraction: 1.8 Gy
Plan Total Fractions Prescribed: 25
Plan Total Prescribed Dose: 45 Gy
Reference Point Dosage Given to Date: 39.6 Gy
Reference Point Session Dosage Given: 1.8 Gy
Session Number: 22

## 2024-12-03 LAB — CBC WITH DIFFERENTIAL (CANCER CENTER ONLY)
Abs Immature Granulocytes: 0.02 K/uL (ref 0.00–0.07)
Basophils Absolute: 0 K/uL (ref 0.0–0.1)
Basophils Relative: 1 %
Eosinophils Absolute: 0.3 K/uL (ref 0.0–0.5)
Eosinophils Relative: 6 %
HCT: 36.5 % (ref 36.0–46.0)
Hemoglobin: 12.6 g/dL (ref 12.0–15.0)
Immature Granulocytes: 1 %
Lymphocytes Relative: 12 %
Lymphs Abs: 0.5 K/uL — ABNORMAL LOW (ref 0.7–4.0)
MCH: 33.2 pg (ref 26.0–34.0)
MCHC: 34.5 g/dL (ref 30.0–36.0)
MCV: 96.3 fL (ref 80.0–100.0)
Monocytes Absolute: 0.4 K/uL (ref 0.1–1.0)
Monocytes Relative: 9 %
Neutro Abs: 3 K/uL (ref 1.7–7.7)
Neutrophils Relative %: 71 %
Platelet Count: 247 K/uL (ref 150–400)
RBC: 3.79 MIL/uL — ABNORMAL LOW (ref 3.87–5.11)
RDW: 13.7 % (ref 11.5–15.5)
WBC Count: 4.2 K/uL (ref 4.0–10.5)
nRBC: 0 % (ref 0.0–0.2)

## 2024-12-03 LAB — CMP (CANCER CENTER ONLY)
ALT: 15 U/L (ref 0–44)
AST: 22 U/L (ref 15–41)
Albumin: 3.8 g/dL (ref 3.5–5.0)
Alkaline Phosphatase: 68 U/L (ref 38–126)
Anion gap: 8 (ref 5–15)
BUN: 15 mg/dL (ref 8–23)
CO2: 27 mmol/L (ref 22–32)
Calcium: 9.5 mg/dL (ref 8.9–10.3)
Chloride: 103 mmol/L (ref 98–111)
Creatinine: 0.83 mg/dL (ref 0.44–1.00)
GFR, Estimated: >60 mL/min
Glucose, Bld: 104 mg/dL — ABNORMAL HIGH (ref 70–99)
Potassium: 4.3 mmol/L (ref 3.5–5.1)
Sodium: 139 mmol/L (ref 135–145)
Total Bilirubin: 0.4 mg/dL (ref 0.0–1.2)
Total Protein: 6.7 g/dL (ref 6.5–8.1)

## 2024-12-03 MED ORDER — SODIUM CHLORIDE 0.9 % IV SOLN
INTRAVENOUS | Status: DC
  Filled 2024-12-03: qty 250

## 2024-12-03 MED ORDER — SODIUM CHLORIDE 0.9 % IV SOLN
1800.0000 mg | INTRAVENOUS | Status: DC
  Administered 2024-12-03: 1800 mg via INTRAVENOUS
  Filled 2024-12-03: qty 36

## 2024-12-03 NOTE — Addendum Note (Signed)
 Addended by: COREY RUFUS HERO on: 12/03/2024 12:57 PM   Modules accepted: Level of Service

## 2024-12-03 NOTE — Patient Instructions (Addendum)
 [facility] - A DEPT OF [facility]  Discharge Instructions: Thank you for choosing [facility] to provide your oncology and hematology care.  If you have a lab appointment with the Cancer Center, please go directly to the Cancer Center and check in at the registration area.  Wear comfortable clothing and clothing appropriate for easy access to any Portacath or PICC line.   We strive to give you quality time with your provider. You may need to reschedule your appointment if you arrive late (15 or more minutes).  Arriving late affects you and other patients whose appointments are after yours.  Also, if you miss three or more appointments without notifying the office, you may be dismissed from the clinic at the providers discretion.      For prescription refill requests, have your pharmacy contact our office and allow 72 hours for refills to be completed.    Today you received the following chemotherapy and/or immunotherapy agents Fluorouracil  96 hour infusion with pump.    To help prevent nausea and vomiting after your treatment, we encourage you to take your nausea medication as directed.  BELOW ARE SYMPTOMS THAT SHOULD BE REPORTED IMMEDIATELY: *FEVER GREATER THAN 100.4 F (38 C) OR HIGHER *CHILLS OR SWEATING *NAUSEA AND VOMITING THAT IS NOT CONTROLLED WITH YOUR NAUSEA MEDICATION *UNUSUAL SHORTNESS OF BREATH *UNUSUAL BRUISING OR BLEEDING *URINARY PROBLEMS (pain or burning when urinating, or frequent urination) *BOWEL PROBLEMS (unusual diarrhea, constipation, pain near the anus) TENDERNESS IN MOUTH AND THROAT WITH OR WITHOUT PRESENCE OF ULCERS (sore throat, sores in mouth, or a toothache) UNUSUAL RASH, SWELLING OR PAIN  UNUSUAL VAGINAL DISCHARGE OR ITCHING   Items with * indicate a potential emergency and should be followed up as soon as possible or go to the Emergency Department if any problems should occur.  Please show the CHEMOTHERAPY  ALERT CARD or IMMUNOTHERAPY ALERT CARD at check-in to the Emergency Department and triage nurse.  Should you have questions after your visit or need to cancel or reschedule your appointment, please contact [facility] - A DEPT OF [facility]  [phone] and follow the prompts.  Office hours are 8:00 a.m. to 4:30 p.m. Monday - Friday. Please note that voicemails left after 4:00 p.m. may not be returned until the following business day.  We are closed weekends and major holidays. You have access to a nurse at all times for urgent questions. Please call the main number to the clinic [phone] and follow the prompts.  For any non-urgent questions, you may also contact your provider using MyChart. We now offer e-Visits for anyone 31 and older to request care online for non-urgent symptoms. For details visit [email or web address].   Also download the MyChart app! Go to the app store, search MyChart, open the app, select [place], and log in with your MyChart username and password.

## 2024-12-03 NOTE — Progress Notes (Signed)
 "  [facility] at [facility] Telephone:([phone]) [phone] Fax:([phone]) [phone]  Patient Care Team: Sadie Manna, MD as PCP - General (Internal Medicine) Maurie Rayfield BIRCH, RN as Oncology Nurse Navigator Melanee Annah BROCKS, MD as Consulting Physician (Oncology)   NAME OF PATIENT: Kathy Wood  [id number]  December 29, 1950   DATE OF VISIT: 12/03/24  REASON FOR CONSULT: Kathy Wood is a 74 y.o. female with multiple medical problems including locally advanced rectal cancer undergoing concurrent chemoradiation.   INTERVAL HISTORY: Patient saw Dr. Melanee on 11/26/2024 for cycle five 5-FU.  Patient was having an intermittent loose stools.  She was started on Bentyl .  Patient here today for cycle 6 of 5-FU.  She was an add-on to [facility] for evaluation of constipation.  Patient states that she took the Bentyl  for 1 day.  It helped her abdominal cramping but she subsequently developed constipation and so stopped it.  She denies significant nausea or vomiting.  No abdominal distention.  Denies any neurologic complaints. Denies recent fevers or illnesses. Denies any easy bleeding or bruising. Reports fair appetite. Denies chest pain. Denies urinary complaints. Patient offers no further specific complaints today.   PAST MEDICAL HISTORY: Past Medical History:  Diagnosis Date   Actinic keratosis    Aortic valvar stenosis    Valve replacement. [facility]. 12/13/18   Basal cell carcinoma    Nasolabial and L eyelid area txted yrs ago per patient   Breast cancer [facility]) 2006   left breast/ DCIS   Breast cancer (HCC) 2009   left breast/ triple NEG   Cancer of left breast (HCC) 2009   Hyperlipidemia    Hypothyroidism    Osteoporosis    Personal history of chemotherapy 2009   Personal history of radiation therapy 2006   Presence of permanent cardiac pacemaker 12/15/2018   [facility]. Medtronic Azure Xt Dr Levada Pauling. [id number]   Rectal cancer [facility])     PAST  SURGICAL HISTORY:  Past Surgical History:  Procedure Laterality Date   AUGMENTATION MAMMAPLASTY Left 2010   mastectomy   BREAST LUMPECTOMY Left 2006   positive   CARDIAC CATHETERIZATION  11/29/2018   [facility]   CARDIAC VALVE REPLACEMENT  12/13/2018   [facility]. Aortic Valve.   CATARACT EXTRACTION W/PHACO Left 05/22/2019   Procedure: CATARACT EXTRACTION PHACO AND INTRAOCULAR LENS PLACEMENT (IOC) left;  Surgeon: Jaye Fallow, MD;  Location: [facility];  Service: Ophthalmology;  Laterality: Left;   CATARACT EXTRACTION W/PHACO Right 06/12/2019   Procedure: CATARACT EXTRACTION PHACO AND INTRAOCULAR LENS PLACEMENT (IOC)  RIGHT;  Surgeon: Jaye Fallow, MD;  Location: [facility];  Service: Ophthalmology;  Laterality: Right;   COLONOSCOPY N/A 09/03/2024   Procedure: COLONOSCOPY;  Surgeon: Therisa Bi, MD;  Location: [facility] END[facility]COPY;  Service: Gastroenterology;  Laterality: N/A;  ANTIBIOTICS BEFORE PROCEDURE PER OFFICE   COLONOSCOPY WITH PROPOFOL  N/A 10/29/2016   Procedure: COLONOSCOPY WITH PROPOFOL ;  Surgeon: Gladis RAYMOND Mariner, MD;  Location: [facility] ENDOSCOPY;  Service: Endoscopy;  Laterality: N/A;   EYE SURGERY     INSERT / REPLACE / REMOVE PACEMAKER  12/15/2018   [facility].  [employer]tr[employer] Azure Xt Dr Levada Pauling [id number]   IR IMAGING GUIDED PORT INSERTION  10/25/2024   MASTECTOMY Left 2010   RECONSTRUCTIVE REPAIR STERNAL Left    REDUCTION MAMMAPLASTY Right 2010    HEMATOLOGY/ONCOLOGY HISTORY:  Oncology History Overview Note  Subjective: Chief Complaint/Diagnosis:   1.  Carcinoma of breast (left) status post mastectomy and reconstructive surgery T1, N0 M0  stage I. ER/PR negative, HER-2 positive. Completed chemotherapy with [facility] in September 2009. 2.  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  Patient had 1 year of maintenance Herceptin therapy   Cancer of left breast (HCC)  04/05/2015 Initial Diagnosis   Cancer of  left breast   Rectal cancer (HCC)  09/30/2024 Initial Diagnosis   Rectal cancer (HCC)   09/30/2024 Cancer Staging   Staging form: Colon and Rectum, AJCC 8th Edition - Clinical stage from 09/30/2024: Stage IIIB (cT3, cN1a, cM0) - Signed by Melanee Annah BROCKS, MD on 09/30/2024 Histopathologic type: Adenocarcinoma, NOS Total positive nodes: 1   10/29/2024 -  Chemotherapy   Patient is on Treatment Plan : RECTUM 5FU IVCI D1-5 (225) + XRT       ALLERGIES:  is allergic to taxotere [docetaxel].  MEDICATIONS:  Current Outpatient Medications  Medication Sig Dispense Refill   Aspirin 81 MG CAPS Take by oral route.     calcium carbonate (OS-CAL) 600 MG TABS tablet Take 600 mg by mouth daily.     Cholecalciferol (VITAMIN D3) 25 MCG (1000 UT) CAPS Take by mouth.     dicyclomine  (BENTYL ) 10 MG capsule Take 1 capsule (10 mg total) by mouth 4 (four) times daily -  before meals and at bedtime. 120 capsule 0   levothyroxine (SYNTHROID, LEVOTHROID) 75 MCG tablet Take 75 mcg by mouth daily before breakfast.     lidocaine -prilocaine  (EMLA ) cream Apply to affected area once (Patient taking differently: as needed. Apply to affected area once) 30 g 3   metoprolol succinate (TOPROL-XL) 25 MG 24 hr tablet Take 25 mg by mouth daily.     metoprolol tartrate (LOPRESSOR) 25 MG tablet Take 12.5 mg by mouth 2 (two) times daily.     Multiple Vitamin (MULTIVITAMIN) tablet Take 1 tablet by mouth daily.     ondansetron  (ZOFRAN ) 8 MG tablet Take 1 tablet (8 mg total) by mouth every 8 (eight) hours as needed for nausea or vomiting. (Patient not taking: Reported on 11/26/2024) 30 tablet 1   polyethylene glycol-electrolytes (NULYTELY) 420 g solution as directed. (Patient not taking: Reported on 11/26/2024)     pravastatin (PRAVACHOL) 40 MG tablet Take 40 mg by mouth daily.     prochlorperazine  (COMPAZINE ) 10 MG tablet Take 1 tablet (10 mg total) by mouth every 6 (six) hours as needed for nausea or vomiting. 30 tablet 1   No  current facility-administered medications for this visit.    VITAL SIGNS: There were no vitals taken for this visit. There were no vitals filed for this visit.  Estimated body mass index is 29.3 kg/m as calculated from the following:   Height as of 11/12/24: 5' 4 (1.626 m).   Weight as of 11/26/24: 170 lb 11.2 oz (77.4 kg).  LABS: CBC:    Component Value Date/Time   WBC 4.2 12/03/2024 0909   WBC 6.1 04/07/2016 1507   HGB 12.6 12/03/2024 0909   HGB 13.7 03/27/2014 0947   HCT 36.5 12/03/2024 0909   HCT 40.3 03/27/2014 0947   PLT 247 12/03/2024 0909   PLT 235 03/27/2014 0947   MCV 96.3 12/03/2024 0909   MCV 94 03/27/2014 0947   NEUTROABS 3.0 12/03/2024 0909   NEUTROABS 2.9 03/27/2014 0947   LYMPHSABS 0.5 (L) 12/03/2024 0909   LYMPHSABS 1.5 03/27/2014 0947   MONOABS 0.4 12/03/2024 0909   MONOABS 0.3 03/27/2014 0947   EOSABS 0.3 12/03/2024 0909   EOSABS  0.1 03/27/2014 0947   BASOSABS 0.0 12/03/2024 0909   BASOSABS 0.1 03/27/2014 0947   Comprehensive Metabolic Panel:    Component Value Date/Time   NA 139 12/03/2024 0909   NA 141 03/27/2014 0947   K 4.3 12/03/2024 0909   K 4.4 03/27/2014 0947   CL 103 12/03/2024 0909   CL 104 03/27/2014 0947   CO2 27 12/03/2024 0909   CO2 30 03/27/2014 0947   BUN 15 12/03/2024 0909   BUN 15 03/27/2014 0947   CREATININE 0.83 12/03/2024 0909   CREATININE 0.75 03/27/2014 0947   GLUCOSE 104 (H) 12/03/2024 0909   GLUCOSE 82 03/27/2014 0947   CALCIUM 9.5 12/03/2024 0909   CALCIUM 9.2 03/27/2014 0947   AST 22 12/03/2024 0909   ALT 15 12/03/2024 0909   ALT 24 03/27/2014 0947   ALKPHOS 68 12/03/2024 0909   ALKPHOS 73 03/27/2014 0947   BILITOT 0.4 12/03/2024 0909   PROT 6.7 12/03/2024 0909   PROT 7.3 03/27/2014 0947   ALBUMIN 3.8 12/03/2024 0909   ALBUMIN 3.8 03/27/2014 0947    RADIOGRAPHIC STUDIES: No results found.  PERFORMANCE STATUS (ECOG) : 1 - Symptomatic but completely ambulatory  Review of Systems Unless otherwise  noted, a complete review of systems is negative.  Physical Exam General: NAD Cardiovascular: regular rate and rhythm Pulmonary: clear ant fields Abdomen: soft, nontender, + bowel sounds GU: no suprapubic tenderness Extremities: no edema, no joint deformities Skin: no rashes Neurological:nonfocal  IMPRESSION/PLAN: Locally advanced rectal cancer -on treatment with 5-FU  Abdominal cramping/constipation-patient reports that Bentyl  was helping her abdominal cramping but she stopped it due to constipation.  Recommended initiation of daily bowel regimen with MiraLAX +/- senna with target of a soft stool daily.  Patient can continue taking Bentyl  as needed for cramping.  Her abdomen is soft and nontender on exam.  Reviewed ED precautions.  Case and plan discussed with Dr. Melanee.  MD follow-up next week.  Patient to be seen sooner as needed.   Patient expressed understanding and was in agreement with this plan. She also understands that She can call clinic at any time with any questions, concerns, or complaints.   Thank you for allowing me to participate in the care of this very pleasant patient.   Time Total: 15 minutes  Visit consisted of counseling and education dealing with the complex and emotionally intense issues of symptom management in the setting of serious illness.Greater than 50%  of this time was spent counseling and coordinating care related to the above assessment and plan.  Signed by: Fonda Mower, PhD, NP-C     "

## 2024-12-03 NOTE — Progress Notes (Signed)
 Per Dr Melanee, keep 5FU at 1800 mg - dose had been reduced d/t to prior leukopenia.  Ugochi Henzler, Pharm.D., CPP 12/03/2024@10 :38 AM

## 2024-12-04 ENCOUNTER — Other Ambulatory Visit: Payer: Self-pay

## 2024-12-04 ENCOUNTER — Ambulatory Visit
Admission: RE | Admit: 2024-12-04 | Discharge: 2024-12-04 | Disposition: A | Source: Ambulatory Visit | Attending: Radiation Oncology | Admitting: Radiation Oncology

## 2024-12-04 DIAGNOSIS — Z510 Encounter for antineoplastic radiation therapy: Secondary | ICD-10-CM | POA: Diagnosis not present

## 2024-12-04 LAB — RAD ONC ARIA SESSION SUMMARY
Course Elapsed Days: 35
Plan Fractions Treated to Date: 23
Plan Prescribed Dose Per Fraction: 1.8 Gy
Plan Total Fractions Prescribed: 25
Plan Total Prescribed Dose: 45 Gy
Reference Point Dosage Given to Date: 41.4 Gy
Reference Point Session Dosage Given: 1.8 Gy
Session Number: 23

## 2024-12-05 ENCOUNTER — Telehealth: Payer: Self-pay | Admitting: Pharmacy Technician

## 2024-12-05 ENCOUNTER — Other Ambulatory Visit (HOSPITAL_COMMUNITY): Payer: Self-pay

## 2024-12-05 ENCOUNTER — Ambulatory Visit
Admission: RE | Admit: 2024-12-05 | Discharge: 2024-12-05 | Disposition: A | Source: Ambulatory Visit | Attending: Radiation Oncology | Admitting: Radiation Oncology

## 2024-12-05 ENCOUNTER — Other Ambulatory Visit: Payer: Self-pay

## 2024-12-05 DIAGNOSIS — Z510 Encounter for antineoplastic radiation therapy: Secondary | ICD-10-CM | POA: Diagnosis not present

## 2024-12-05 LAB — RAD ONC ARIA SESSION SUMMARY
Course Elapsed Days: 36
Plan Fractions Treated to Date: 24
Plan Prescribed Dose Per Fraction: 1.8 Gy
Plan Total Fractions Prescribed: 25
Plan Total Prescribed Dose: 45 Gy
Reference Point Dosage Given to Date: 43.2 Gy
Reference Point Session Dosage Given: 1.8 Gy
Session Number: 24

## 2024-12-05 NOTE — Telephone Encounter (Signed)
 Oral Oncology Patient Advocate Encounter   New authorization   Received notification that prior authorization for dicyclomine  (BENTYL ) 10 MG capsule is required.   PA submitted on CMM via Latent [employer] [id number] Status is pending     Caidin Heidenreich (Patty) Chet Burnet, CPhT  [facility], [place], [facility] - Oral Chemotherapy Patient Advocate Specialist III Phone: [phone]  Fax: [phone]

## 2024-12-06 ENCOUNTER — Ambulatory Visit
Admission: RE | Admit: 2024-12-06 | Discharge: 2024-12-06 | Disposition: A | Discharge status: Home / Self Care | Source: Ambulatory Visit | Attending: Radiation Oncology | Admitting: Radiation Oncology

## 2024-12-06 ENCOUNTER — Other Ambulatory Visit: Payer: Self-pay

## 2024-12-06 DIAGNOSIS — Z510 Encounter for antineoplastic radiation therapy: Secondary | ICD-10-CM | POA: Diagnosis not present

## 2024-12-06 LAB — RAD ONC ARIA SESSION SUMMARY
Course Elapsed Days: 37
Plan Fractions Treated to Date: 25
Plan Prescribed Dose Per Fraction: 1.8 Gy
Plan Total Fractions Prescribed: 25
Plan Total Prescribed Dose: 45 Gy
Reference Point Dosage Given to Date: 45 Gy
Reference Point Session Dosage Given: 1.8 Gy
Session Number: 25

## 2024-12-06 NOTE — Telephone Encounter (Signed)
 Oral Oncology Patient Advocate Encounter  Received a notification regarding Prior Authorization from for [facility] Medicare part D. Authorization has been DENIED due to the following reason  We denied coverage for this drug because Dicyclomine  is not being prescribed for an FDA labeled or medically accepted use. A medically accepted use is approved by the FDA or supported by the [facility] Formulary Service Drug Information and the DRUGDEX Information System. In this case, Dicyclomine  is not being prescribed in accordance with an FDA labeled use or use accepted by the Medicare approved drug compendia.  Please see indexed document in the patient's media tab for more detailed information.  Please advise if you wish us  to pursue an appeal, thanks!  Kathy Wood (Patty) Chet Burnet, CPhT  [facility], [place], [facility] - Oral Chemotherapy Patient Advocate Specialist III Phone: [phone]  Fax: [phone]

## 2024-12-07 ENCOUNTER — Other Ambulatory Visit: Payer: Self-pay

## 2024-12-07 ENCOUNTER — Inpatient Hospital Stay

## 2024-12-07 ENCOUNTER — Ambulatory Visit
Admission: RE | Admit: 2024-12-07 | Discharge: 2024-12-07 | Disposition: A | Source: Ambulatory Visit | Attending: Radiation Oncology | Admitting: Radiation Oncology

## 2024-12-07 DIAGNOSIS — Z510 Encounter for antineoplastic radiation therapy: Secondary | ICD-10-CM | POA: Diagnosis not present

## 2024-12-07 LAB — RAD ONC ARIA SESSION SUMMARY
Course Elapsed Days: 38
Plan Fractions Treated to Date: 1
Plan Prescribed Dose Per Fraction: 1.8 Gy
Plan Total Fractions Prescribed: 3
Plan Total Prescribed Dose: 5.4 Gy
Reference Point Dosage Given to Date: 1.8 Gy
Reference Point Session Dosage Given: 1.8 Gy
Session Number: 26

## 2024-12-10 ENCOUNTER — Ambulatory Visit

## 2024-12-10 ENCOUNTER — Inpatient Hospital Stay

## 2024-12-10 ENCOUNTER — Inpatient Hospital Stay: Admitting: Oncology

## 2024-12-11 ENCOUNTER — Encounter: Payer: Self-pay | Admitting: Oncology

## 2024-12-11 ENCOUNTER — Inpatient Hospital Stay: Admitting: Oncology

## 2024-12-11 ENCOUNTER — Other Ambulatory Visit: Payer: Self-pay

## 2024-12-11 ENCOUNTER — Ambulatory Visit

## 2024-12-11 ENCOUNTER — Inpatient Hospital Stay

## 2024-12-11 ENCOUNTER — Ambulatory Visit
Admission: RE | Admit: 2024-12-11 | Discharge: 2024-12-11 | Attending: Radiation Oncology | Admitting: Radiation Oncology

## 2024-12-11 ENCOUNTER — Other Ambulatory Visit: Payer: Self-pay | Admitting: Oncology

## 2024-12-11 VITALS — BP 137/67 | HR 82 | Temp 98.6°F | Resp 18 | Ht 64.0 in | Wt 166.7 lb

## 2024-12-11 DIAGNOSIS — C20 Malignant neoplasm of rectum: Secondary | ICD-10-CM

## 2024-12-11 DIAGNOSIS — T451X5A Adverse effect of antineoplastic and immunosuppressive drugs, initial encounter: Secondary | ICD-10-CM

## 2024-12-11 DIAGNOSIS — Z510 Encounter for antineoplastic radiation therapy: Secondary | ICD-10-CM | POA: Diagnosis not present

## 2024-12-11 DIAGNOSIS — R110 Nausea: Secondary | ICD-10-CM | POA: Diagnosis not present

## 2024-12-11 DIAGNOSIS — Z5111 Encounter for antineoplastic chemotherapy: Secondary | ICD-10-CM | POA: Diagnosis not present

## 2024-12-11 LAB — RAD ONC ARIA SESSION SUMMARY
Course Elapsed Days: 42
Plan Fractions Treated to Date: 2
Plan Prescribed Dose Per Fraction: 1.8 Gy
Plan Total Fractions Prescribed: 3
Plan Total Prescribed Dose: 5.4 Gy
Reference Point Dosage Given to Date: 3.6 Gy
Reference Point Session Dosage Given: 1.8 Gy
Session Number: 27

## 2024-12-11 LAB — CMP (CANCER CENTER ONLY)
ALT: 23 U/L (ref 0–44)
AST: 20 U/L (ref 15–41)
Albumin: 3.8 g/dL (ref 3.5–5.0)
Alkaline Phosphatase: 81 U/L (ref 38–126)
Anion gap: 11 (ref 5–15)
BUN: 16 mg/dL (ref 8–23)
CO2: 26 mmol/L (ref 22–32)
Calcium: 9.5 mg/dL (ref 8.9–10.3)
Chloride: 101 mmol/L (ref 98–111)
Creatinine: 0.83 mg/dL (ref 0.44–1.00)
GFR, Estimated: >60 mL/min
Glucose, Bld: 113 mg/dL — ABNORMAL HIGH (ref 70–99)
Potassium: 4.2 mmol/L (ref 3.5–5.1)
Sodium: 138 mmol/L (ref 135–145)
Total Bilirubin: 0.3 mg/dL (ref 0.0–1.2)
Total Protein: 6.6 g/dL (ref 6.5–8.1)

## 2024-12-11 LAB — CBC WITH DIFFERENTIAL (CANCER CENTER ONLY)
Abs Immature Granulocytes: 0.01 10*3/uL (ref 0.00–0.07)
Basophils Absolute: 0 10*3/uL (ref 0.0–0.1)
Basophils Relative: 1 %
Eosinophils Absolute: 0.2 10*3/uL (ref 0.0–0.5)
Eosinophils Relative: 3 %
HCT: 37.4 % (ref 36.0–46.0)
Hemoglobin: 12.8 g/dL (ref 12.0–15.0)
Immature Granulocytes: 0 %
Lymphocytes Relative: 10 %
Lymphs Abs: 0.5 10*3/uL — ABNORMAL LOW (ref 0.7–4.0)
MCH: 33.4 pg (ref 26.0–34.0)
MCHC: 34.2 g/dL (ref 30.0–36.0)
MCV: 97.7 fL (ref 80.0–100.0)
Monocytes Absolute: 0.4 10*3/uL (ref 0.1–1.0)
Monocytes Relative: 8 %
Neutro Abs: 3.7 10*3/uL (ref 1.7–7.7)
Neutrophils Relative %: 78 %
Platelet Count: 278 10*3/uL (ref 150–400)
RBC: 3.83 MIL/uL — ABNORMAL LOW (ref 3.87–5.11)
RDW: 14.6 % (ref 11.5–15.5)
WBC Count: 4.7 10*3/uL (ref 4.0–10.5)
nRBC: 0 % (ref 0.0–0.2)

## 2024-12-11 MED ORDER — DEXAMETHASONE 4 MG PO TABS: 8.0000 mg | ORAL_TABLET | Freq: Every day | ORAL | 1 refills | Status: AC

## 2024-12-11 NOTE — Progress Notes (Signed)
 "    Hematology/Oncology Consult note [facility]  [phone] [phone]  Patient Care Team: Sadie Manna, MD as PCP - General (Internal Medicine) Maurie Rayfield BIRCH, RN as Oncology Nurse Navigator Melanee Annah BROCKS, MD as Consulting Physician (Oncology)   Name of the patient: Kathy Wood  [id number]  Aug 15, 1951   Date of visit: 12/11/24  Diagnosis-  Cancer Staging  Cancer of left breast [facility]) Staging form: Breast, AJCC 7th Edition - Clinical: Stage IA (T1c, N0, M0) - Unsigned Laterality: Left  Rectal cancer [facility]) Staging form: Colon and Rectum, AJCC 8th Edition - Clinical stage from 09/30/2024: Stage IIIB (cT3, cN1a, cM0) - Signed by Melanee Annah BROCKS, MD on 09/30/2024 Histopathologic type: Adenocarcinoma, NOS Total positive nodes: 1    Chief complaint/ Reason for visit-routine follow-up of rectal cancer  Heme/Onc history: Kathy Wood is a 74 year old female with a history of breast cancer who presents with rectal bleeding and a newly diagnosed rectal mass. She was referred by [facility] GI NPChristiane London for evaluation of rectal bleeding.   She has experienced intermittent rectal bleeding since the summer, with the initial episode not recurring. Colonoscopy showed nonobstructing medium-sized mass 3 to 5 cm from the anus measuring 2 cm in size.  Biopsy was consistent with moderate to poorly differentiated adenocarcinoma. MSI stable disease.    Her last colonoscopy in 2017 showed no abnormalities, and she has not undergone any other related procedures since then.   She has a history of breast cancer treated with mastectomy and chemotherapy 15 years ago. Genetic testing five years ago did not reveal significant findings. No family history of colon, breast, or stomach cancer.   She denies other symptoms such as pain or pressure.    MRI pelvis on 09/25/2024 showed T3a N1 disease with possible involvement of levator ani  musculature.  Distance of the tumor from anal verge 5 cm.  Tumor distance from internal anal sphincter 2.5 cm.  Possible small volume extension at 7 o'clock position less than 1 mm T3a.  High mesorectal lymph node 8 mm. N1.    Patient has been seen by [facility] colorectal surgery and plan is for total neoadjuvant treatment with concurrent chemoradiation with infusional 5-FU followed by FOLFOX chemotherapy for 4 months and consideration for surgery versus wait and watch approach    Interval history- Kathy Wood is a 74 year old female with locally advanced rectal cancer undergoing concurrent chemoradiation who presents for oncology follow-up due to worsening chemotherapy-induced gastrointestinal symptoms and cancer-related weight loss.  Over the past several weeks, she has experienced worsening abdominal cramping described as severe, sharp, and squeezing, likened to labor pain. The cramping occurs unpredictably, often after meals, and has persisted throughout the day of the visit. Bentyl  has been used intermittently for cramping with inconsistent efficacy, and during the current episode, it has not provided relief after more than an hour. She notes hard stools when taking Bentyl  regularly and uses daily MiraLAX to manage constipation.  She reports intermittent nausea, particularly prior to the current visit, but is uncertain whether it is attributable to cramping or is a separate symptom. No episodes of vomiting have occurred. Oral intake is maintained but is challenging, with decreased appetite and aversion to certain foods. She is following a low fiber diet as recommended by her radiation oncologist, which she finds restrictive. Bowel movements are characterized by frequent small spurts rather than normal patterns, contributing to irritation.  Over the past two  weeks, she has experienced a weight loss of approximately four pounds. She is able to eat but finds it difficult and lacks appetite for  certain foods.       ECOG PS- 1 Pain scale- 4   Review of systems- Review of Systems  Constitutional:  Negative for chills, fever, malaise/fatigue and weight loss.  HENT:  Negative for congestion, ear discharge and nosebleeds.   Eyes:  Negative for blurred vision.  Respiratory:  Negative for cough, hemoptysis, sputum production, shortness of breath and wheezing.   Cardiovascular:  Negative for chest pain, palpitations, orthopnea and claudication.  Gastrointestinal:  Negative for abdominal pain, blood in stool, constipation, diarrhea, heartburn, melena, nausea and vomiting.       Abdominal cramping  Genitourinary:  Negative for dysuria, flank pain, frequency, hematuria and urgency.  Musculoskeletal:  Negative for back pain, joint pain and myalgias.  Skin:  Negative for rash.  Neurological:  Negative for dizziness, tingling, focal weakness, seizures, weakness and headaches.  Endo/Heme/Allergies:  Does not bruise/bleed easily.  Psychiatric/Behavioral:  Negative for depression and suicidal ideas. The patient does not have insomnia.       Allergies[1]   Past Medical History:  Diagnosis Date   Actinic keratosis    Aortic valvar stenosis    Valve replacement. [facility]. 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 Medtronic Azure Xt Dr Levada Pauling. [id number]   Rectal cancer [facility])      Past Surgical History:  Procedure Laterality Date   AUGMENTATION MAMMAPLASTY Left 2010   mastectomy   BREAST LUMPECTOMY Left 2006   positive   CARDIAC CATHETERIZATION  11/29/2018   [facility]   CARDIAC VALVE REPLACEMENT   12/13/2018   [facility]. Aortic Valve.   CATARACT EXTRACTION W/PHACO Left 05/22/2019   Procedure: CATARACT EXTRACTION PHACO AND INTRAOCULAR LENS PLACEMENT (IOC) left;  Surgeon: Jaye Fallow, MD;  Location[facility];  Service: Ophthalmology;  Laterality: Left;   CATARACT EXTRACTION W/PHACO Right 06/12/2019   Procedure: CATARACT EXTRACTION PHACO AND INTRAOCULAR LENS PLACEMENT (IOC)  RIGHT;  Surgeon: Jaye Fallow, MD;  Location: [facility] SURGERY [facility]TR;  Service: Ophthalmology;  Laterality: Right;   COLONOSCOPY N/A 09/03/2024   Procedure: COLONOSCOPY;  Surgeon: Therisa Bi, MD;  Location: [facility] ENDOSCOPY;  Service: Gastroenterology;  Laterality: N/A;  ANTIBIOTICS BEFORE PROCEDURE PER OFFICE   COLONOSCOPY WITH PROPOFOL  N/A 10/29/2016   Procedure: COLONOSCOPY WITH PROPOFOL ;  Surgeon: Gladis RAYMOND Mariner, MD;  Location: [facility] ENDOSCOPY;  Service: Endoscopy;  Laterality: N/A;   EYE SURGERY     INSERT / REPLACE / REMOVE PACEMAKER  12/15/2018   [facility].  Medtronic Azure Xt Dr Levada Pauling [id number]   IR IMAGING GUIDED PORT INSERTION  10/25/2024   MASTECTOMY Left 2010   RECONSTRUCTIVE REPAIR STERNAL Left    REDUCTION MAMMAPLASTY Right 2010    Social History   Socioeconomic History   Marital status: Married    Spouse name: Josephine Wooldridge   Number of children: 3   Years of education: Not on file   Highest education level: Not on  file  Occupational History   Not on file  Tobacco Use   Smoking status: Never   Smokeless tobacco: Never  Vaping Use   Vaping status: Never Used  Substance and Sexual Activity   Alcohol use: Yes    Comment: rare drinker   Drug use: No   Sexual activity: Yes  Other Topics Concern   Not on file  Social History Narrative   Not on file   Social Drivers of Health   Tobacco Use: Low Risk (11/26/2024)   Patient History    Smoking Tobacco Use: Never    Smokeless Tobacco Use: Never    Passive Exposure: Not on file  Financial Resource Strain: Low Risk   (08/13/2024)   Received from [facility]   Overall Financial Resource Strain (CARDIA)    Difficulty of Paying Living Expenses: Not hard at all  Food Insecurity: No Food Insecurity (09/05/2024)   Epic    Worried About Running Out of Food in the Last Year: Never true    Ran Out of Food in the Last Year: Never true  Transportation Needs: No Transportation Needs (09/05/2024)   Epic    Lack of Transportation (Medical): No    Lack of Transportation (Non-Medical): No  Physical Activity: Not on file  Stress: Not on file  Social Connections: Not on file  Intimate Partner Violence: Not At Risk (09/05/2024)   Epic    Fear of Current or Ex-Partner: No    Emotionally Abused: No    Physically Abused: No    Sexually Abused: No  Depression (PHQ2-9): Low Risk (12/03/2024)   Depression (PHQ2-9)    PHQ-2 Score: 0  Alcohol Screen: Not on file  Housing: Low Risk (09/05/2024)   Epic    Unable to Pay for Housing in the Last Year: No    Number of Times Moved in the Last Year: 0    Homeless in the Last Year: No  Utilities: Not At Risk (09/05/2024)   Epic    Threatened with loss of utilities: No  Health Literacy: Not on file    Family History  Problem Relation Age of Onset   Breast cancer Neg Hx     Current Medications[2]  Physical exam:  Vitals:   12/11/24 1346  BP: 137/67  Pulse: 82  Resp: 18  Temp: 98.6 F (37 C)  TempSrc: Tympanic  SpO2: 100%  Weight: 166 lb 11.2 oz (75.6 kg)  Height: 5' 4 (1.626 m)   Physical Exam Cardiovascular:     Rate and Rhythm: Normal rate and regular rhythm.     Heart sounds: Normal heart sounds.  Pulmonary:     Effort: Pulmonary effort is normal.     Breath sounds: Normal breath sounds.  Abdominal:     General: Bowel sounds are normal.     Palpations: Abdomen is soft.  Skin:    General: Skin is warm and dry.  Neurological:     Mental Status: She is alert and oriented to person, place, and time.      I have personally  reviewed labs listed below:    Latest Ref Rng & Units 12/03/2024    9:09 AM  CMP  Glucose 70 - 99 mg/dL 895   BUN 8 - 23 mg/dL 15   Creatinine 9.55 - 1.00 mg/dL 9.16   Sodium 864 - 854 mmol/L 139   Potassium 3.5 - 5.1 mmol/L 4.3   Chloride 98 - 111 mmol/L 103   CO2 22 - 32  mmol/L 27   Calcium 8.9 - 10.3 mg/dL 9.5   Total Protein 6.5 - 8.1 g/dL 6.7   Total Bilirubin 0.0 - 1.2 mg/dL 0.4   Alkaline Phos 38 - 126 U/L 68   AST 15 - 41 U/L 22   ALT 0 - 44 U/L 15       Latest Ref Rng & Units 12/03/2024    9:09 AM  CBC  WBC 4.0 - 10.5 K/uL 4.2   Hemoglobin 12.0 - 15.0 g/dL 87.3   Hematocrit 63.9 - 46.0 % 36.5   Platelets 150 - 400 K/uL 247     Assessment and plan- Patient is a 74 y.o. 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  She is here for on treatment assessment prior to week 6 of infusional 5-FU chemotherapy  Patient is completed 5 weeks of infusional 5-FU chemotherapy so far.  She is receiving radiation boost to her primary site of tumor this week.  She has been dealing with significant abdominal cramping for the last few weeks.  She uses Bentyl  occasionally but not every 4 hours.  She is on a low fiber diet.  There is not enough evidence to support low fiber diet during radiation therapy for rectal cancer.  High-fiber diet may actually reduce GI toxicity both during and after pelvic radiation therapy.  I have therefore asked her to continue increasing the fiber in her diet.  I have encouraged her to use Bentyl  4 doses a day to see if it will continue to help her with cramping and if it does not I will consider stopping Bentyl  and switching her to sublingual Levsin every 4 hours given its short duration of action however it can lead to side effects such as dry mouth and constipation.  I am holding off on giving her chemotherapy this week.  We will plan to start FOLFOX chemotherapy on 12/31/2024 and plan is to do it every 2 weeks for 8 cycles followed by repeat  MRI and consideration for surgery.  She completes radiation therapy on 12/17/2024.  I have also encouraged her to use as needed Zofran  for her ongoing nausea symptoms     Visit Diagnosis 1. Rectal cancer (HCC)   2. Chemotherapy-induced nausea      Dr. Annah Skene, MD, MPH [facility] [phone] 12/11/2024 1:24 PM                   [1]  Allergies Allergen Reactions   Taxotere [Docetaxel] Other (See Comments) and Anaphylaxis    flush and back pain  [2]  Current Outpatient Medications:    Aspirin 81 MG CAPS, Take by oral route., Disp: , Rfl:    calcium carbonate (OS-CAL) 600 MG TABS tablet, Take 600 mg by mouth daily., Disp: , Rfl:    Cholecalciferol (VITAMIN D3) 25 MCG (1000 UT) CAPS, Take by mouth., Disp: , Rfl:    dicyclomine  (BENTYL ) 10 MG capsule, Take 1 capsule (10 mg total) by mouth 4 (four) times daily -  before meals and at bedtime., Disp: 120 capsule, Rfl: 0   levothyroxine (SYNTHROID, LEVOTHROID) 75 MCG tablet, Take 75 mcg by mouth daily before breakfast., Disp: , Rfl:    lidocaine -prilocaine  (EMLA ) cream, Apply to affected area once (Patient taking differently: as needed. Apply to affected area once), Disp: 30 g, Rfl: 3   metoprolol succinate (TOPROL-XL) 25 MG 24 hr tablet, Take 25 mg by mouth daily., Disp: , Rfl:  metoprolol tartrate (LOPRESSOR) 25 MG tablet, Take 12.5 mg by mouth 2 (two) times daily., Disp: , Rfl:    Multiple Vitamin (MULTIVITAMIN) tablet, Take 1 tablet by mouth daily., Disp: , Rfl:    ondansetron  (ZOFRAN ) 8 MG tablet, Take 1 tablet (8 mg total) by mouth every 8 (eight) hours as needed for nausea or vomiting. (Patient not taking: Reported on 11/26/2024), Disp: 30 tablet, Rfl: 1   polyethylene glycol-electrolytes (NULYTELY) 420 g solution, as directed. (Patient not taking: Reported on 11/26/2024), Disp: , Rfl:    pravastatin (PRAVACHOL) 40 MG tablet, Take 40 mg by mouth daily., Disp: , Rfl:    prochlorperazine   (COMPAZINE ) 10 MG tablet, Take 1 tablet (10 mg total) by mouth every 6 (six) hours as needed for nausea or vomiting., Disp: 30 tablet, Rfl: 1  "

## 2024-12-11 NOTE — Progress Notes (Signed)
 Patient states she's is not feeling well today. She has been cramping for a few weeks now & the medication prescribed for cramping given by NP Sidra is not helping & making her stools hard. She presents today still having severe cramping & nauseated.

## 2024-12-12 ENCOUNTER — Ambulatory Visit
Admission: RE | Admit: 2024-12-12 | Discharge: 2024-12-12 | Attending: Radiation Oncology | Admitting: Radiation Oncology

## 2024-12-12 ENCOUNTER — Other Ambulatory Visit: Payer: Self-pay

## 2024-12-12 ENCOUNTER — Ambulatory Visit

## 2024-12-12 ENCOUNTER — Telehealth: Payer: Self-pay

## 2024-12-12 DIAGNOSIS — Z510 Encounter for antineoplastic radiation therapy: Secondary | ICD-10-CM | POA: Diagnosis not present

## 2024-12-12 DIAGNOSIS — C20 Malignant neoplasm of rectum: Secondary | ICD-10-CM

## 2024-12-12 LAB — RAD ONC ARIA SESSION SUMMARY
Course Elapsed Days: 43
Plan Fractions Treated to Date: 3
Plan Prescribed Dose Per Fraction: 1.8 Gy
Plan Total Fractions Prescribed: 3
Plan Total Prescribed Dose: 5.4 Gy
Reference Point Dosage Given to Date: 5.4 Gy
Reference Point Session Dosage Given: 1.8 Gy
Session Number: 28

## 2024-12-12 NOTE — Telephone Encounter (Signed)
 Per Joli patient has been added to her schedule tomorrow at 1:30pm.  Outbound call to patient; informed of above.

## 2024-12-12 NOTE — Telephone Encounter (Signed)
 Per Dr. Melanee patient comes for radiation daily.  Reach out to patient and see if she's willing to meet with Joli to discuss increasing fiber in diet around the times she'll be here for radiation. Patient is open to meeting with Joli tomorrow after radiation to discuss increasing fiber in diet.  Referral in system

## 2024-12-13 ENCOUNTER — Inpatient Hospital Stay

## 2024-12-13 ENCOUNTER — Other Ambulatory Visit: Payer: Self-pay

## 2024-12-13 ENCOUNTER — Ambulatory Visit

## 2024-12-13 ENCOUNTER — Ambulatory Visit
Admission: RE | Admit: 2024-12-13 | Discharge: 2024-12-13 | Disposition: A | Discharge status: Home / Self Care | Source: Ambulatory Visit | Attending: Radiation Oncology | Admitting: Radiation Oncology

## 2024-12-13 DIAGNOSIS — C20 Malignant neoplasm of rectum: Secondary | ICD-10-CM

## 2024-12-13 DIAGNOSIS — Z510 Encounter for antineoplastic radiation therapy: Secondary | ICD-10-CM | POA: Diagnosis not present

## 2024-12-13 LAB — RAD ONC ARIA SESSION SUMMARY
Course Elapsed Days: 44
Plan Fractions Treated to Date: 1
Plan Prescribed Dose Per Fraction: 1.8 Gy
Plan Total Fractions Prescribed: 3
Plan Total Prescribed Dose: 5.4 Gy
Reference Point Dosage Given to Date: 1.8 Gy
Reference Point Session Dosage Given: 1.8 Gy
Session Number: 29

## 2024-12-13 NOTE — Progress Notes (Signed)
 Nutrition Assessment   Reason for Assessment:   Referral from Dr Melanee   ASSESSMENT:  74 year old female with rectal cancer.  History of breast cancer, HLD, osteoporosis.  Receiving concurrent chemo and radiation.  Last radiation treatment due on 2/2.   Met with patient following radiation.  Reports No real appetite or desire to eat.  Reports over the past 2 weeks having abdominal cramping, some nausea.  Reports that stools are loose and frequent.  Has been taking bentyl  but none today.  When she takes it she does 1 dose of miralax as it has caused constipation.  Yesterday ate peanut butter on english muffin for breakfast.  Ate soup for lunch and toast for dinner.  Did not feel good and went to bed early last night (fatigue).  Has been eating lots of chicken, canned green beans, cooked carrots, stews, beef, carrots and noodles/rice.  Drinks kefir daily and eats yogurt.  Has Fairlife shakes but does not drink them regularly.  This am ate scrambled eggs and 1/2 bagel.    Medications: MVI, calcium carbonate, Vit D3, decadron    Labs: glucose 113   Anthropometrics:   Height: 64 inches Weight: 166 lb 11.2 oz on 1/27 170 lb 11.2 oz on 1/12 174 lb 11.2 oz on 12/3 BMI: 28  5% weight loss in the last month, significant   Estimated Energy Needs  Kcals: 8124-7749 Protein: 94-112 g Fluid: > 1875 ml   NUTRITION DIAGNOSIS: Food and nutrition related knowledge deficit related to cancer treatment as evidenced by meeting with RD    INTERVENTION:  Encouraged adding Fairlife shake daily for additional calories and protein and to prevent weight loss Encouraged foods high in soluble fiber (list of foods provided) Discussed adding psyllium to diet (metamucil).   Discussed eating small meals q 2 hours to prevent further weight loss Discussed foods rich in protein to include.   Answered questions regarding food safety during treatment.   MONITORING, EVALUATION, GOAL:  Weight trends,  intake  Next Visit: Wed, Feb 18 after pump removal  Kearston Putman B. Dasie SOLON, CSO, LDN Registered Dietitian [phone]

## 2024-12-14 ENCOUNTER — Ambulatory Visit

## 2024-12-14 ENCOUNTER — Other Ambulatory Visit: Payer: Self-pay

## 2024-12-14 ENCOUNTER — Ambulatory Visit
Admission: RE | Admit: 2024-12-14 | Discharge: 2024-12-14 | Disposition: A | Source: Ambulatory Visit | Attending: Radiation Oncology | Admitting: Radiation Oncology

## 2024-12-14 ENCOUNTER — Inpatient Hospital Stay

## 2024-12-14 DIAGNOSIS — Z510 Encounter for antineoplastic radiation therapy: Secondary | ICD-10-CM | POA: Diagnosis not present

## 2024-12-14 LAB — RAD ONC ARIA SESSION SUMMARY
Course Elapsed Days: 45
Plan Fractions Treated to Date: 2
Plan Prescribed Dose Per Fraction: 1.8 Gy
Plan Total Fractions Prescribed: 3
Plan Total Prescribed Dose: 5.4 Gy
Reference Point Dosage Given to Date: 3.6 Gy
Reference Point Session Dosage Given: 1.8 Gy
Session Number: 30

## 2024-12-17 ENCOUNTER — Ambulatory Visit

## 2024-12-18 ENCOUNTER — Other Ambulatory Visit: Payer: Self-pay

## 2024-12-18 ENCOUNTER — Ambulatory Visit
Admission: RE | Admit: 2024-12-18 | Discharge: 2024-12-18 | Attending: Radiation Oncology | Admitting: Radiation Oncology

## 2024-12-18 LAB — RAD ONC ARIA SESSION SUMMARY
Course Elapsed Days: 49
Plan Fractions Treated to Date: 3
Plan Prescribed Dose Per Fraction: 1.8 Gy
Plan Total Fractions Prescribed: 3
Plan Total Prescribed Dose: 5.4 Gy
Reference Point Dosage Given to Date: 5.4 Gy
Reference Point Session Dosage Given: 1.8 Gy
Session Number: 31

## 2024-12-18 NOTE — Progress Notes (Signed)
 Pharmacist Chemotherapy Monitoring - Initial Assessment    Anticipated start date: 12/31/24   The following has been reviewed per standard work regarding the patient's treatment regimen: The patient's diagnosis, treatment plan and drug doses, and organ/hematologic function Lab orders and baseline tests specific to treatment regimen  The treatment plan start date, drug sequencing, and pre-medications Prior authorization status  Patient's documented medication list, including drug-drug interaction screen and prescriptions for anti-emetics and supportive care specific to the treatment regimen The drug concentrations, fluid compatibility, administration routes, and timing of the medications to be used The patient's access for treatment and lifetime cumulative dose history, if applicable  The patient's medication allergies and previous infusion related reactions, if applicable    Pharmacogenomics Oncology Pharmacy Note  Test Completion: Kathy Wood has undergone Guardant360 Liquid pharmacogenetic testing. Patient completed sample collection on 10/29/24 and results were provided on 11/09/24.  Test Results: Wildtype (Normal) Metabolizer  Impression and Plan Results indicate an activity score Normal with recommendation to keep standard dosing and adjust per toxicities as needed.  Recommended Starting Dose: normal   CPIC Guidelines for DPYD Genotype and Fluoropyrimidine Dosing  Phenotype Activity Score Recommendations  Wildtype (Normal) Normal No indication for change in dose or therapy.  Use label recommended dosing and administration  Intermediate 1.5 Reduce starting dose followed by titration based on toxicity Reduce dose by 25% - 50%  Intermediate 1.0 Reduce starting dose followed by titration based on toxicity Reduce dose by 50%  Poor 0.5  Generally avoid If no alternative agents are available, severely reduce dose with close monitoring  Poor 0 Avoid use   CPIC guidelines  (as of 10/2024): https://files.cpicpgx.org/data/guideline/publication/fluoropyrimidines/2017/29152729.pdf   Changes made to treatment plan:  N/A  Follow up needed:  N/A   Kathy Wood E Kathy Wood, [facility], 12/18/2024  10:44 AM

## 2024-12-19 NOTE — Radiation Completion Notes (Signed)
 Patient Name: Kathy Wood, Kathy Wood MRN: [id number] Date of Birth: 1951/03/07 Referring Physician: ANNAH SKENE, M.D. Date of Service: 2024-12-19 Radiation Oncologist: Marcey Penton, M.D. [facility]                             RADIATION ONCOLOGY END OF TREATMENT NOTE     Diagnosis: C20 Malignant neoplasm of rectum Staging on 2015-04-05: Cancer of left breast (HCC) T=T1c, N=N0, M=M0 Staging on 2024-09-30: Rectal cancer (HCC) T=cT3, N=cN1a, M=cM0 Intent: Curative     HPI: Patient is a 74 year old female prior patient of mine previously treated for breast cancer.  She noticed blood per rectum waited 2 months and had a colonoscopy at which time there was noted a mass 3 to 5 cm from the anus noncircumferential.  Biopsy was positive for moderately to poorly differentiated adenocarcinoma.  MRI scan was performed showing a T3a N1 rectal adenocarcinoma.  There was possible involvement of the right levator ani muscle.  No anal sphincter involvement was noted she was seen at [facility] and pathology and MRI findings confirmed.  Tumor board recommendation was for concurrent chemoradiation followed by chemotherapy.  She has seen surgeon and will revisit them when we have completed treatments for assessment.  She is seen today and doing well she states has not seen rectal bleeding over the past several weeks.  She is having no rectal pain on defecation.  No increased lower urinary tract symptoms.      ==========DELIVERED PLANS==========  First Treatment Date: 2024-10-30 Last Treatment Date: 2024-12-18   Plan Name: Rectum Site: Rectum Technique: 3D Mode: Photon Dose Per Fraction: 1.8 Gy Prescribed Dose (Delivered / Prescribed): 45 Gy / 45 Gy Prescribed Fxs (Delivered / Prescribed): 25 / 25   Plan Name: Rectum_Bst_1 Site: Rectum Technique: 3D Mode: Photon Dose Per Fraction: 1.8 Gy Prescribed Dose (Delivered / Prescribed): 5.4 Gy / 5.4 Gy Prescribed Fxs (Delivered / Prescribed): 3  / 3   Plan Name: Rectum_Bst_2 Site: Rectum Technique: 3D Mode: Photon Dose Per Fraction: 1.8 Gy Prescribed Dose (Delivered / Prescribed): 5.4 Gy / 5.4 Gy Prescribed Fxs (Delivered / Prescribed): 3 / 3     ==========ON TREATMENT VISIT DATES========== 2024-10-30, 2024-11-06, 2024-11-13, 2024-11-20, 2024-11-27, 2024-12-04, 2024-12-11, 2024-12-18     ==========UPCOMING VISITS========== 01/14/2025 [facility] RAD ONCOLOGY FOLLOW UP 30 Penton Marcey, MD  01/02/2025 [facility] MED ONC NUT 45 Dasie Simple, RD  01/02/2025 [facility] ONC PUMP START STOP [facility]-PORT FLUSH  01/02/2025 [facility] MOHS SURGERY  Corey Rufus HERO, MD  12/31/2024 [facility] ONC INFUSION [facility]- MO INFUSION CHAIR 15  12/31/2024 [facility] ONC EST PT Skene Annah BROCKS, MD  12/31/2024 [facility] ONC INF PORT FLUSH W/LAB [facility]-PORT FLUSH        ==========APPENDIX - ON TREATMENT VISIT NOTES==========   See weekly On Treatment Notes in Epic for details in the Media tab (listed as Progress notes on the On Treatment Visit Dates listed above).

## 2024-12-31 ENCOUNTER — Inpatient Hospital Stay

## 2024-12-31 ENCOUNTER — Inpatient Hospital Stay: Admitting: Oncology

## 2025-01-02 ENCOUNTER — Encounter: Admitting: Dermatology

## 2025-01-02 ENCOUNTER — Inpatient Hospital Stay

## 2025-01-14 ENCOUNTER — Ambulatory Visit: Admitting: Radiation Oncology

## 2025-01-16 ENCOUNTER — Ambulatory Visit: Admitting: Radiation Oncology

## 2025-10-23 ENCOUNTER — Ambulatory Visit: Admitting: Dermatology
